# Patient Record
Sex: Male | Born: 1950 | Race: White | Hispanic: No | Marital: Married | Smoking: Former smoker
Health system: Southern US, Community
[De-identification: ages and names within clinical notes are randomized; demographics above are authoritative.]

## PROBLEM LIST (undated history)

## (undated) DIAGNOSIS — G5600 Carpal tunnel syndrome, unspecified upper limb: Secondary | ICD-10-CM

## (undated) DIAGNOSIS — T884XXA Failed or difficult intubation, initial encounter: Secondary | ICD-10-CM

## (undated) DIAGNOSIS — K449 Diaphragmatic hernia without obstruction or gangrene: Secondary | ICD-10-CM

## (undated) DIAGNOSIS — G4733 Obstructive sleep apnea (adult) (pediatric): Secondary | ICD-10-CM

## (undated) DIAGNOSIS — G4730 Sleep apnea, unspecified: Secondary | ICD-10-CM

## (undated) DIAGNOSIS — N183 Chronic kidney disease, stage 3 unspecified: Secondary | ICD-10-CM

## (undated) DIAGNOSIS — N200 Calculus of kidney: Secondary | ICD-10-CM

## (undated) DIAGNOSIS — D649 Anemia, unspecified: Secondary | ICD-10-CM

## (undated) DIAGNOSIS — E785 Hyperlipidemia, unspecified: Secondary | ICD-10-CM

## (undated) DIAGNOSIS — Z9689 Presence of other specified functional implants: Secondary | ICD-10-CM

## (undated) DIAGNOSIS — F4310 Post-traumatic stress disorder, unspecified: Secondary | ICD-10-CM

## (undated) DIAGNOSIS — N419 Inflammatory disease of prostate, unspecified: Secondary | ICD-10-CM

## (undated) DIAGNOSIS — Z950 Presence of cardiac pacemaker: Secondary | ICD-10-CM

## (undated) DIAGNOSIS — I10 Essential (primary) hypertension: Secondary | ICD-10-CM

## (undated) DIAGNOSIS — G894 Chronic pain syndrome: Secondary | ICD-10-CM

## (undated) DIAGNOSIS — M4305 Spondylolysis, thoracolumbar region: Secondary | ICD-10-CM

## (undated) DIAGNOSIS — J449 Chronic obstructive pulmonary disease, unspecified: Secondary | ICD-10-CM

## (undated) DIAGNOSIS — Z87442 Personal history of urinary calculi: Secondary | ICD-10-CM

## (undated) DIAGNOSIS — I700 Atherosclerosis of aorta: Secondary | ICD-10-CM

## (undated) DIAGNOSIS — G5603 Carpal tunnel syndrome, bilateral upper limbs: Secondary | ICD-10-CM

## (undated) DIAGNOSIS — F329 Major depressive disorder, single episode, unspecified: Secondary | ICD-10-CM

## (undated) DIAGNOSIS — N529 Male erectile dysfunction, unspecified: Secondary | ICD-10-CM

## (undated) DIAGNOSIS — C801 Malignant (primary) neoplasm, unspecified: Secondary | ICD-10-CM

## (undated) DIAGNOSIS — E663 Overweight: Secondary | ICD-10-CM

## (undated) DIAGNOSIS — M436 Torticollis: Secondary | ICD-10-CM

## (undated) DIAGNOSIS — M5030 Other cervical disc degeneration, unspecified cervical region: Secondary | ICD-10-CM

## (undated) DIAGNOSIS — Z9289 Personal history of other medical treatment: Secondary | ICD-10-CM

## (undated) DIAGNOSIS — M129 Arthropathy, unspecified: Secondary | ICD-10-CM

## (undated) DIAGNOSIS — G5793 Unspecified mononeuropathy of bilateral lower limbs: Secondary | ICD-10-CM

## (undated) DIAGNOSIS — T4145XA Adverse effect of unspecified anesthetic, initial encounter: Secondary | ICD-10-CM

## (undated) DIAGNOSIS — R0600 Dyspnea, unspecified: Secondary | ICD-10-CM

## (undated) DIAGNOSIS — Z8719 Personal history of other diseases of the digestive system: Secondary | ICD-10-CM

## (undated) DIAGNOSIS — G629 Polyneuropathy, unspecified: Secondary | ICD-10-CM

## (undated) DIAGNOSIS — R7303 Prediabetes: Secondary | ICD-10-CM

## (undated) DIAGNOSIS — K219 Gastro-esophageal reflux disease without esophagitis: Secondary | ICD-10-CM

## (undated) DIAGNOSIS — I499 Cardiac arrhythmia, unspecified: Secondary | ICD-10-CM

## (undated) DIAGNOSIS — M1990 Unspecified osteoarthritis, unspecified site: Secondary | ICD-10-CM

## (undated) DIAGNOSIS — H269 Unspecified cataract: Secondary | ICD-10-CM

## (undated) DIAGNOSIS — N189 Chronic kidney disease, unspecified: Secondary | ICD-10-CM

## (undated) DIAGNOSIS — F32A Depression, unspecified: Secondary | ICD-10-CM

## (undated) DIAGNOSIS — F419 Anxiety disorder, unspecified: Secondary | ICD-10-CM

## (undated) DIAGNOSIS — R339 Retention of urine, unspecified: Secondary | ICD-10-CM

## (undated) DIAGNOSIS — T8859XA Other complications of anesthesia, initial encounter: Secondary | ICD-10-CM

## (undated) DIAGNOSIS — E669 Obesity, unspecified: Secondary | ICD-10-CM

## (undated) HISTORY — DX: Inflammatory disease of prostate, unspecified: N41.9

## (undated) HISTORY — PX: CERVICAL FUSION: SHX112

## (undated) HISTORY — DX: Overweight: E66.3

## (undated) HISTORY — PX: CARDIAC PACEMAKER PLACEMENT: SHX583

## (undated) HISTORY — PX: COLONOSCOPY: SHX5424

## (undated) HISTORY — DX: Retention of urine, unspecified: R33.9

## (undated) HISTORY — PX: APPENDECTOMY: SHX54

## (undated) HISTORY — DX: Calculus of kidney: N20.0

## (undated) HISTORY — DX: Essential (primary) hypertension: I10

## (undated) HISTORY — DX: Obesity, unspecified: E66.9

## (undated) HISTORY — DX: Arthropathy, unspecified: M12.9

## (undated) HISTORY — PX: JOINT REPLACEMENT: SHX530

## (undated) HISTORY — PX: TONSILLECTOMY: SUR1361

## (undated) HISTORY — DX: Male erectile dysfunction, unspecified: N52.9

## (undated) HISTORY — PX: BACK SURGERY: SHX140

---

## 1986-01-28 HISTORY — PX: LUMBAR LAMINECTOMY: SHX95

## 2004-01-29 HISTORY — PX: LUMBAR LAMINECTOMY: SHX95

## 2004-03-19 ENCOUNTER — Encounter: Payer: Self-pay | Admitting: Rheumatology

## 2004-03-28 ENCOUNTER — Encounter: Payer: Self-pay | Admitting: Rheumatology

## 2004-04-24 ENCOUNTER — Ambulatory Visit: Payer: Self-pay | Admitting: Rheumatology

## 2004-04-28 ENCOUNTER — Encounter: Payer: Self-pay | Admitting: Family Medicine

## 2004-04-28 ENCOUNTER — Encounter: Payer: Self-pay | Admitting: Rheumatology

## 2004-05-28 ENCOUNTER — Encounter: Payer: Self-pay | Admitting: Family Medicine

## 2004-07-17 ENCOUNTER — Ambulatory Visit: Payer: Self-pay

## 2004-10-11 ENCOUNTER — Ambulatory Visit: Payer: Self-pay | Admitting: Anesthesiology

## 2004-10-25 ENCOUNTER — Encounter: Payer: Self-pay | Admitting: Anesthesiology

## 2004-10-28 ENCOUNTER — Encounter: Payer: Self-pay | Admitting: Anesthesiology

## 2004-11-06 ENCOUNTER — Ambulatory Visit: Payer: Self-pay | Admitting: Anesthesiology

## 2004-11-12 ENCOUNTER — Ambulatory Visit: Payer: Self-pay | Admitting: Pain Medicine

## 2004-12-18 ENCOUNTER — Ambulatory Visit: Payer: Self-pay | Admitting: Gastroenterology

## 2004-12-25 ENCOUNTER — Ambulatory Visit: Payer: Self-pay | Admitting: Anesthesiology

## 2005-01-09 ENCOUNTER — Ambulatory Visit: Payer: Self-pay | Admitting: Pain Medicine

## 2005-01-30 ENCOUNTER — Ambulatory Visit: Payer: Self-pay | Admitting: Pain Medicine

## 2005-02-04 ENCOUNTER — Encounter: Payer: Self-pay | Admitting: Anesthesiology

## 2005-03-04 ENCOUNTER — Ambulatory Visit: Payer: Self-pay | Admitting: Physician Assistant

## 2005-04-02 ENCOUNTER — Ambulatory Visit: Payer: Self-pay | Admitting: Physician Assistant

## 2005-05-02 ENCOUNTER — Ambulatory Visit: Payer: Self-pay | Admitting: Physician Assistant

## 2005-06-05 ENCOUNTER — Ambulatory Visit: Payer: Self-pay | Admitting: Physician Assistant

## 2005-06-27 ENCOUNTER — Encounter: Payer: Self-pay | Admitting: Family Medicine

## 2005-06-28 ENCOUNTER — Encounter: Payer: Self-pay | Admitting: Family Medicine

## 2005-07-02 ENCOUNTER — Ambulatory Visit: Payer: Self-pay | Admitting: Physician Assistant

## 2005-07-15 ENCOUNTER — Ambulatory Visit: Payer: Self-pay | Admitting: Physician Assistant

## 2005-07-17 ENCOUNTER — Ambulatory Visit: Payer: Self-pay | Admitting: Physician Assistant

## 2005-07-22 ENCOUNTER — Ambulatory Visit: Payer: Self-pay | Admitting: Physician Assistant

## 2005-08-06 ENCOUNTER — Ambulatory Visit: Payer: Self-pay | Admitting: Physician Assistant

## 2005-08-19 ENCOUNTER — Ambulatory Visit: Payer: Self-pay | Admitting: Pain Medicine

## 2005-08-20 ENCOUNTER — Ambulatory Visit: Payer: Self-pay | Admitting: Pain Medicine

## 2005-09-04 ENCOUNTER — Ambulatory Visit: Payer: Self-pay | Admitting: Physician Assistant

## 2005-09-16 ENCOUNTER — Ambulatory Visit: Payer: Self-pay | Admitting: Pain Medicine

## 2005-09-17 ENCOUNTER — Ambulatory Visit: Payer: Self-pay | Admitting: Pain Medicine

## 2005-09-26 ENCOUNTER — Ambulatory Visit: Payer: Self-pay | Admitting: Family Medicine

## 2005-10-09 ENCOUNTER — Ambulatory Visit: Payer: Self-pay | Admitting: Physician Assistant

## 2005-10-23 ENCOUNTER — Ambulatory Visit: Payer: Self-pay | Admitting: Pain Medicine

## 2005-10-24 ENCOUNTER — Ambulatory Visit: Payer: Self-pay | Admitting: Pain Medicine

## 2005-11-07 ENCOUNTER — Ambulatory Visit: Payer: Self-pay | Admitting: Pain Medicine

## 2005-11-21 ENCOUNTER — Ambulatory Visit: Payer: Self-pay | Admitting: Pain Medicine

## 2005-12-17 ENCOUNTER — Ambulatory Visit: Payer: Self-pay | Admitting: Physician Assistant

## 2006-01-15 ENCOUNTER — Ambulatory Visit: Payer: Self-pay | Admitting: Physician Assistant

## 2006-01-28 HISTORY — PX: ANTERIOR CERVICAL DECOMP/DISCECTOMY FUSION: SHX1161

## 2006-01-28 HISTORY — PX: CERVICAL SPINE SURGERY: SHX589

## 2006-02-19 ENCOUNTER — Ambulatory Visit: Payer: Self-pay | Admitting: Physician Assistant

## 2006-03-19 ENCOUNTER — Ambulatory Visit: Payer: Self-pay | Admitting: Physician Assistant

## 2006-04-03 ENCOUNTER — Encounter: Payer: Self-pay | Admitting: Podiatry

## 2006-04-14 ENCOUNTER — Ambulatory Visit: Payer: Self-pay | Admitting: Physician Assistant

## 2006-04-24 ENCOUNTER — Ambulatory Visit: Payer: Self-pay | Admitting: Physician Assistant

## 2006-05-26 ENCOUNTER — Ambulatory Visit: Payer: Self-pay | Admitting: Physician Assistant

## 2006-06-02 ENCOUNTER — Other Ambulatory Visit: Payer: Self-pay

## 2006-06-02 ENCOUNTER — Inpatient Hospital Stay: Payer: Self-pay | Admitting: Unknown Physician Specialty

## 2006-06-16 ENCOUNTER — Ambulatory Visit: Payer: Self-pay | Admitting: Unknown Physician Specialty

## 2006-06-17 ENCOUNTER — Ambulatory Visit: Payer: Self-pay | Admitting: Physician Assistant

## 2006-06-29 ENCOUNTER — Ambulatory Visit: Payer: Self-pay | Admitting: Unknown Physician Specialty

## 2006-07-02 ENCOUNTER — Ambulatory Visit: Payer: Self-pay | Admitting: Physician Assistant

## 2006-08-11 ENCOUNTER — Ambulatory Visit: Payer: Self-pay | Admitting: Physician Assistant

## 2006-09-08 ENCOUNTER — Ambulatory Visit: Payer: Self-pay | Admitting: Physician Assistant

## 2006-10-08 ENCOUNTER — Ambulatory Visit: Payer: Self-pay | Admitting: Physician Assistant

## 2006-11-04 ENCOUNTER — Ambulatory Visit: Payer: Self-pay | Admitting: Physician Assistant

## 2007-04-04 ENCOUNTER — Ambulatory Visit: Payer: Self-pay | Admitting: Family Medicine

## 2007-07-05 ENCOUNTER — Emergency Department: Payer: Self-pay | Admitting: Emergency Medicine

## 2007-07-14 ENCOUNTER — Ambulatory Visit: Payer: Self-pay | Admitting: Physician Assistant

## 2007-07-28 ENCOUNTER — Ambulatory Visit: Payer: Self-pay | Admitting: Pain Medicine

## 2007-08-10 ENCOUNTER — Ambulatory Visit: Payer: Self-pay | Admitting: Pain Medicine

## 2007-12-28 ENCOUNTER — Ambulatory Visit: Payer: Self-pay | Admitting: Family Medicine

## 2008-01-12 ENCOUNTER — Ambulatory Visit: Payer: Self-pay | Admitting: Unknown Physician Specialty

## 2008-02-22 ENCOUNTER — Ambulatory Visit: Payer: Self-pay | Admitting: Unknown Physician Specialty

## 2008-02-24 ENCOUNTER — Ambulatory Visit: Payer: Self-pay | Admitting: Unknown Physician Specialty

## 2008-02-26 ENCOUNTER — Emergency Department: Payer: Self-pay | Admitting: Emergency Medicine

## 2008-02-28 ENCOUNTER — Ambulatory Visit: Payer: Self-pay | Admitting: Family Medicine

## 2009-01-28 DIAGNOSIS — C61 Malignant neoplasm of prostate: Secondary | ICD-10-CM

## 2009-01-28 DIAGNOSIS — C801 Malignant (primary) neoplasm, unspecified: Secondary | ICD-10-CM

## 2009-01-28 HISTORY — DX: Malignant (primary) neoplasm, unspecified: C80.1

## 2009-01-28 HISTORY — DX: Malignant neoplasm of prostate: C61

## 2009-08-25 ENCOUNTER — Ambulatory Visit: Payer: Self-pay

## 2010-01-04 ENCOUNTER — Ambulatory Visit: Payer: Self-pay

## 2010-01-09 ENCOUNTER — Ambulatory Visit: Payer: Self-pay | Admitting: Unknown Physician Specialty

## 2010-01-12 ENCOUNTER — Ambulatory Visit: Payer: Self-pay

## 2010-01-16 ENCOUNTER — Inpatient Hospital Stay: Payer: Self-pay | Admitting: Unknown Physician Specialty

## 2010-06-14 ENCOUNTER — Ambulatory Visit: Payer: Self-pay | Admitting: Specialist

## 2010-06-28 ENCOUNTER — Ambulatory Visit: Payer: Self-pay | Admitting: Specialist

## 2010-06-29 ENCOUNTER — Ambulatory Visit: Payer: Self-pay | Admitting: Specialist

## 2010-07-29 ENCOUNTER — Ambulatory Visit: Payer: Self-pay | Admitting: Specialist

## 2010-08-29 ENCOUNTER — Ambulatory Visit: Payer: Self-pay | Admitting: Specialist

## 2010-09-24 ENCOUNTER — Ambulatory Visit: Payer: Self-pay | Admitting: Urology

## 2010-09-29 ENCOUNTER — Ambulatory Visit: Payer: Self-pay | Admitting: Specialist

## 2010-10-29 ENCOUNTER — Ambulatory Visit: Payer: Self-pay | Admitting: Specialist

## 2010-12-20 ENCOUNTER — Emergency Department: Payer: Self-pay | Admitting: Emergency Medicine

## 2011-01-01 ENCOUNTER — Ambulatory Visit: Payer: Self-pay | Admitting: Specialist

## 2011-03-26 ENCOUNTER — Ambulatory Visit: Payer: Self-pay | Admitting: Family Medicine

## 2011-05-29 DIAGNOSIS — I442 Atrioventricular block, complete: Secondary | ICD-10-CM

## 2011-05-29 HISTORY — DX: Atrioventricular block, complete: I44.2

## 2011-06-26 ENCOUNTER — Ambulatory Visit: Payer: Self-pay | Admitting: Family Medicine

## 2011-06-26 ENCOUNTER — Inpatient Hospital Stay: Payer: Self-pay | Admitting: Internal Medicine

## 2011-06-26 LAB — PROTIME-INR
INR: 1.1
Prothrombin Time: 14.3 secs (ref 11.5–14.7)

## 2011-06-26 LAB — COMPREHENSIVE METABOLIC PANEL
Albumin: 3.4 g/dL (ref 3.4–5.0)
Alkaline Phosphatase: 71 U/L (ref 50–136)
Anion Gap: 11 (ref 7–16)
BUN: 19 mg/dL — ABNORMAL HIGH (ref 7–18)
Bilirubin,Total: 0.9 mg/dL (ref 0.2–1.0)
Calcium, Total: 8.2 mg/dL — ABNORMAL LOW (ref 8.5–10.1)
Chloride: 111 mmol/L — ABNORMAL HIGH (ref 98–107)
Co2: 24 mmol/L (ref 21–32)
Creatinine: 0.99 mg/dL (ref 0.60–1.30)
EGFR (African American): >60
EGFR (Non-African Amer.): >60
Glucose: 76 mg/dL (ref 65–99)
Osmolality: 292 (ref 275–301)
Potassium: 3.7 mmol/L (ref 3.5–5.1)
SGOT(AST): 25 U/L (ref 15–37)
SGPT (ALT): 29 U/L
Sodium: 146 mmol/L — ABNORMAL HIGH (ref 136–145)
Total Protein: 6.4 g/dL (ref 6.4–8.2)

## 2011-06-26 LAB — CBC
HCT: 42.5 % (ref 40.0–52.0)
HGB: 14.7 g/dL (ref 13.0–18.0)
MCH: 32.7 pg (ref 26.0–34.0)
MCHC: 34.6 g/dL (ref 32.0–36.0)
MCV: 95 fL (ref 80–100)
Platelet: 169 10*3/uL (ref 150–440)
RBC: 4.49 10*6/uL (ref 4.40–5.90)
RDW: 13 % (ref 11.5–14.5)
WBC: 8.8 10*3/uL (ref 3.8–10.6)

## 2011-06-26 LAB — APTT: Activated PTT: 32.3 secs (ref 23.6–35.9)

## 2011-06-26 LAB — CK TOTAL AND CKMB (NOT AT ARMC)
CK, Total: 200 U/L (ref 35–232)
CK-MB: 4.2 ng/mL — ABNORMAL HIGH (ref 0.5–3.6)

## 2011-06-26 LAB — TROPONIN I: Troponin-I: <0.02 ng/mL

## 2011-06-27 LAB — BASIC METABOLIC PANEL
Anion Gap: 7 (ref 7–16)
BUN: 18 mg/dL (ref 7–18)
Calcium, Total: 8 mg/dL — ABNORMAL LOW (ref 8.5–10.1)
Chloride: 113 mmol/L — ABNORMAL HIGH (ref 98–107)
Co2: 26 mmol/L (ref 21–32)
Creatinine: 0.76 mg/dL (ref 0.60–1.30)
EGFR (African American): >60
EGFR (Non-African Amer.): >60
Glucose: 90 mg/dL (ref 65–99)
Osmolality: 292 (ref 275–301)
Potassium: 3.7 mmol/L (ref 3.5–5.1)
Sodium: 146 mmol/L — ABNORMAL HIGH (ref 136–145)

## 2011-06-27 LAB — CBC WITH DIFFERENTIAL/PLATELET
Basophil #: 0.1 10*3/uL (ref 0.0–0.1)
Basophil %: 0.8 %
Eosinophil #: 0.2 10*3/uL (ref 0.0–0.7)
Eosinophil %: 2.8 %
HCT: 40 % (ref 40.0–52.0)
HGB: 13.7 g/dL (ref 13.0–18.0)
Lymphocyte #: 2.4 10*3/uL (ref 1.0–3.6)
Lymphocyte %: 31.3 %
MCH: 32.4 pg (ref 26.0–34.0)
MCHC: 34.2 g/dL (ref 32.0–36.0)
MCV: 95 fL (ref 80–100)
Monocyte #: 0.6 x10 3/mm (ref 0.2–1.0)
Monocyte %: 8.2 %
Neutrophil #: 4.3 10*3/uL (ref 1.4–6.5)
Neutrophil %: 56.9 %
Platelet: 146 10*3/uL — ABNORMAL LOW (ref 150–440)
RBC: 4.22 10*6/uL — ABNORMAL LOW (ref 4.40–5.90)
RDW: 13.2 % (ref 11.5–14.5)
WBC: 7.5 10*3/uL (ref 3.8–10.6)

## 2011-06-27 LAB — TROPONIN I
Troponin-I: <0.02 ng/mL
Troponin-I: <0.02 ng/mL

## 2011-06-27 LAB — CK TOTAL AND CKMB (NOT AT ARMC)
CK, Total: 128 U/L (ref 35–232)
CK, Total: 99 U/L (ref 35–232)
CK-MB: 3.4 ng/mL (ref 0.5–3.6)
CK-MB: 2.6 ng/mL (ref 0.5–3.6)

## 2011-06-27 LAB — TSH: Thyroid Stimulating Horm: 0.468 u[IU]/mL

## 2011-06-28 DIAGNOSIS — Z950 Presence of cardiac pacemaker: Secondary | ICD-10-CM

## 2011-06-28 HISTORY — DX: Presence of cardiac pacemaker: Z95.0

## 2011-06-28 HISTORY — PX: CARDIAC PACEMAKER PLACEMENT: SHX583

## 2011-09-13 ENCOUNTER — Ambulatory Visit: Payer: Self-pay | Admitting: Cardiovascular Disease

## 2011-09-13 DIAGNOSIS — I2510 Atherosclerotic heart disease of native coronary artery without angina pectoris: Secondary | ICD-10-CM

## 2011-09-13 HISTORY — DX: Atherosclerotic heart disease of native coronary artery without angina pectoris: I25.10

## 2011-09-13 HISTORY — PX: LEFT HEART CATH AND CORONARY ANGIOGRAPHY: CATH118249

## 2012-08-11 ENCOUNTER — Ambulatory Visit: Payer: Self-pay | Admitting: Orthopedic Surgery

## 2012-10-12 ENCOUNTER — Ambulatory Visit: Payer: Self-pay | Admitting: Orthopedic Surgery

## 2012-10-12 LAB — MRSA PCR SCREENING

## 2012-10-12 LAB — BASIC METABOLIC PANEL
Anion Gap: 5 — ABNORMAL LOW (ref 7–16)
BUN: 21 mg/dL — ABNORMAL HIGH (ref 7–18)
Calcium, Total: 8.9 mg/dL (ref 8.5–10.1)
Chloride: 108 mmol/L — ABNORMAL HIGH (ref 98–107)
Co2: 27 mmol/L (ref 21–32)
Creatinine: 0.96 mg/dL (ref 0.60–1.30)
EGFR (African American): >60
EGFR (Non-African Amer.): >60
Glucose: 93 mg/dL (ref 65–99)
Osmolality: 282 (ref 275–301)
Potassium: 3.9 mmol/L (ref 3.5–5.1)
Sodium: 140 mmol/L (ref 136–145)

## 2012-10-12 LAB — URINALYSIS, COMPLETE
Bacteria: NONE SEEN
Bilirubin,UR: NEGATIVE
Blood: NEGATIVE
Glucose,UR: NEGATIVE mg/dL (ref 0–75)
Hyaline Cast: 2
Leukocyte Esterase: NEGATIVE
Nitrite: NEGATIVE
Ph: 5 (ref 4.5–8.0)
Protein: 25
RBC,UR: 14 /HPF (ref 0–5)
Specific Gravity: 1.015 (ref 1.003–1.030)
Squamous Epithelial: 1
WBC UR: 6 /HPF (ref 0–5)

## 2012-10-12 LAB — PROTIME-INR
INR: 1
Prothrombin Time: 13.7 secs (ref 11.5–14.7)

## 2012-10-12 LAB — APTT: Activated PTT: 35.1 secs (ref 23.6–35.9)

## 2012-10-12 LAB — CBC
HCT: 46.1 % (ref 40.0–52.0)
HGB: 16.1 g/dL (ref 13.0–18.0)
MCH: 32.5 pg (ref 26.0–34.0)
MCHC: 35 g/dL (ref 32.0–36.0)
MCV: 93 fL (ref 80–100)
Platelet: 207 10*3/uL (ref 150–440)
RBC: 4.96 10*6/uL (ref 4.40–5.90)
RDW: 12.8 % (ref 11.5–14.5)
WBC: 11.1 10*3/uL — ABNORMAL HIGH (ref 3.8–10.6)

## 2012-10-12 LAB — SEDIMENTATION RATE: Erythrocyte Sed Rate: 5 mm/hr (ref 0–20)

## 2012-10-22 ENCOUNTER — Inpatient Hospital Stay: Payer: Self-pay | Admitting: Orthopedic Surgery

## 2012-10-22 LAB — APTT: Activated PTT: 25.7 secs (ref 23.6–35.9)

## 2012-10-22 LAB — CBC WITH DIFFERENTIAL/PLATELET
Basophil #: 0.1 10*3/uL (ref 0.0–0.1)
Basophil %: 0.3 %
Eosinophil #: 0 10*3/uL (ref 0.0–0.7)
Eosinophil %: 0.2 %
HCT: 47.2 % (ref 40.0–52.0)
HGB: 16 g/dL (ref 13.0–18.0)
Lymphocyte #: 2.2 10*3/uL (ref 1.0–3.6)
Lymphocyte %: 7.5 %
MCH: 32.5 pg (ref 26.0–34.0)
MCHC: 33.9 g/dL (ref 32.0–36.0)
MCV: 96 fL (ref 80–100)
Monocyte #: 1.3 x10 3/mm — ABNORMAL HIGH (ref 0.2–1.0)
Monocyte %: 4.3 %
Neutrophil #: 26 10*3/uL — ABNORMAL HIGH (ref 1.4–6.5)
Neutrophil %: 87.7 %
Platelet: 207 10*3/uL (ref 150–440)
RBC: 4.92 10*6/uL (ref 4.40–5.90)
RDW: 13.1 % (ref 11.5–14.5)
WBC: 29.6 10*3/uL — ABNORMAL HIGH (ref 3.8–10.6)

## 2012-10-22 LAB — BASIC METABOLIC PANEL
Anion Gap: 8 (ref 7–16)
BUN: 15 mg/dL (ref 7–18)
Calcium, Total: 7.4 mg/dL — ABNORMAL LOW (ref 8.5–10.1)
Chloride: 110 mmol/L — ABNORMAL HIGH (ref 98–107)
Co2: 20 mmol/L — ABNORMAL LOW (ref 21–32)
Creatinine: 1.2 mg/dL (ref 0.60–1.30)
EGFR (African American): >60
EGFR (Non-African Amer.): >60
Glucose: 217 mg/dL — ABNORMAL HIGH (ref 65–99)
Osmolality: 283 (ref 275–301)
Potassium: 4.9 mmol/L (ref 3.5–5.1)
Sodium: 138 mmol/L (ref 136–145)

## 2012-10-22 LAB — HEMOGLOBIN: HGB: 15.3 g/dL (ref 13.0–18.0)

## 2012-10-22 LAB — PROTIME-INR
INR: 1.1
Prothrombin Time: 14.3 secs (ref 11.5–14.7)

## 2012-10-23 LAB — CBC WITH DIFFERENTIAL/PLATELET
Basophil #: 0 10*3/uL (ref 0.0–0.1)
Basophil %: 0.2 %
Eosinophil #: 0 10*3/uL (ref 0.0–0.7)
Eosinophil %: 0 %
HCT: 42.2 % (ref 40.0–52.0)
HGB: 14.6 g/dL (ref 13.0–18.0)
Lymphocyte #: 1.6 10*3/uL (ref 1.0–3.6)
Lymphocyte %: 7.5 %
MCH: 32.9 pg (ref 26.0–34.0)
MCHC: 34.6 g/dL (ref 32.0–36.0)
MCV: 95 fL (ref 80–100)
Monocyte #: 1.6 x10 3/mm — ABNORMAL HIGH (ref 0.2–1.0)
Monocyte %: 7.3 %
Neutrophil #: 18.7 10*3/uL — ABNORMAL HIGH (ref 1.4–6.5)
Neutrophil %: 85 %
Platelet: 160 10*3/uL (ref 150–440)
RBC: 4.44 10*6/uL (ref 4.40–5.90)
RDW: 13.3 % (ref 11.5–14.5)
WBC: 22 10*3/uL — ABNORMAL HIGH (ref 3.8–10.6)

## 2012-10-23 LAB — BASIC METABOLIC PANEL
Anion Gap: 12 (ref 7–16)
BUN: 27 mg/dL — ABNORMAL HIGH (ref 7–18)
Calcium, Total: 7.7 mg/dL — ABNORMAL LOW (ref 8.5–10.1)
Chloride: 110 mmol/L — ABNORMAL HIGH (ref 98–107)
Co2: 18 mmol/L — ABNORMAL LOW (ref 21–32)
Creatinine: 1.84 mg/dL — ABNORMAL HIGH (ref 0.60–1.30)
EGFR (African American): 45 — ABNORMAL LOW
EGFR (Non-African Amer.): 39 — ABNORMAL LOW
Glucose: 146 mg/dL — ABNORMAL HIGH (ref 65–99)
Osmolality: 287 (ref 275–301)
Potassium: 4.8 mmol/L (ref 3.5–5.1)
Sodium: 140 mmol/L (ref 136–145)

## 2012-10-24 LAB — URINALYSIS, COMPLETE
Bilirubin,UR: NEGATIVE
Glucose,UR: NEGATIVE mg/dL (ref 0–75)
Ketone: NEGATIVE
Leukocyte Esterase: NEGATIVE
Nitrite: NEGATIVE
Ph: 5 (ref 4.5–8.0)
Protein: NEGATIVE
RBC,UR: 192 /HPF (ref 0–5)
Specific Gravity: 1.014 (ref 1.003–1.030)
Squamous Epithelial: 1
WBC UR: 8 /HPF (ref 0–5)

## 2012-10-24 LAB — BASIC METABOLIC PANEL
Anion Gap: 5 — ABNORMAL LOW (ref 7–16)
BUN: 24 mg/dL — ABNORMAL HIGH (ref 7–18)
Calcium, Total: 7.7 mg/dL — ABNORMAL LOW (ref 8.5–10.1)
Chloride: 111 mmol/L — ABNORMAL HIGH (ref 98–107)
Co2: 24 mmol/L (ref 21–32)
Creatinine: 1.23 mg/dL (ref 0.60–1.30)
EGFR (African American): >60
EGFR (Non-African Amer.): >60
Glucose: 150 mg/dL — ABNORMAL HIGH (ref 65–99)
Osmolality: 286 (ref 275–301)
Potassium: 4.7 mmol/L (ref 3.5–5.1)
Sodium: 140 mmol/L (ref 136–145)

## 2012-10-24 LAB — HEMOGLOBIN: HGB: 10.4 g/dL — ABNORMAL LOW (ref 13.0–18.0)

## 2012-10-25 LAB — CBC WITH DIFFERENTIAL/PLATELET
Basophil #: 0 10*3/uL (ref 0.0–0.1)
Basophil %: 0.3 %
Eosinophil #: 0.2 10*3/uL (ref 0.0–0.7)
Eosinophil %: 1.4 %
HCT: 26.4 % — ABNORMAL LOW (ref 40.0–52.0)
HGB: 9.5 g/dL — ABNORMAL LOW (ref 13.0–18.0)
Lymphocyte #: 1 10*3/uL (ref 1.0–3.6)
Lymphocyte %: 8.1 %
MCH: 33.3 pg (ref 26.0–34.0)
MCHC: 35.7 g/dL (ref 32.0–36.0)
MCV: 93 fL (ref 80–100)
Monocyte #: 0.9 x10 3/mm (ref 0.2–1.0)
Monocyte %: 6.7 %
Neutrophil #: 10.7 10*3/uL — ABNORMAL HIGH (ref 1.4–6.5)
Neutrophil %: 83.5 %
Platelet: 103 10*3/uL — ABNORMAL LOW (ref 150–440)
RBC: 2.83 10*6/uL — ABNORMAL LOW (ref 4.40–5.90)
RDW: 13 % (ref 11.5–14.5)
WBC: 12.8 10*3/uL — ABNORMAL HIGH (ref 3.8–10.6)

## 2012-10-26 LAB — HEMOGLOBIN: HGB: 9.2 g/dL — ABNORMAL LOW (ref 13.0–18.0)

## 2013-07-12 DIAGNOSIS — T84099A Other mechanical complication of unspecified internal joint prosthesis, initial encounter: Secondary | ICD-10-CM | POA: Insufficient documentation

## 2013-07-12 DIAGNOSIS — Z96649 Presence of unspecified artificial hip joint: Secondary | ICD-10-CM | POA: Insufficient documentation

## 2013-07-12 DIAGNOSIS — T84018A Broken internal joint prosthesis, other site, initial encounter: Secondary | ICD-10-CM | POA: Insufficient documentation

## 2013-07-14 ENCOUNTER — Ambulatory Visit: Payer: Self-pay | Admitting: Orthopedic Surgery

## 2013-07-14 DIAGNOSIS — I10 Essential (primary) hypertension: Secondary | ICD-10-CM

## 2013-07-14 LAB — URINALYSIS, COMPLETE
Bacteria: NONE SEEN
Bilirubin,UR: NEGATIVE
Blood: NEGATIVE
Glucose,UR: NEGATIVE mg/dL (ref 0–75)
Ketone: NEGATIVE
Leukocyte Esterase: NEGATIVE
Nitrite: NEGATIVE
Ph: 5 (ref 4.5–8.0)
Protein: NEGATIVE
RBC,UR: 1 /HPF (ref 0–5)
Specific Gravity: 1.019 (ref 1.003–1.030)
Squamous Epithelial: 1
WBC UR: 2 /HPF (ref 0–5)

## 2013-07-14 LAB — BASIC METABOLIC PANEL
Anion Gap: 7 (ref 7–16)
BUN: 18 mg/dL (ref 7–18)
Calcium, Total: 8.3 mg/dL — ABNORMAL LOW (ref 8.5–10.1)
Chloride: 109 mmol/L — ABNORMAL HIGH (ref 98–107)
Co2: 25 mmol/L (ref 21–32)
Creatinine: 0.75 mg/dL (ref 0.60–1.30)
EGFR (African American): >60
EGFR (Non-African Amer.): >60
Glucose: 85 mg/dL (ref 65–99)
Osmolality: 282 (ref 275–301)
Potassium: 4.2 mmol/L (ref 3.5–5.1)
Sodium: 141 mmol/L (ref 136–145)

## 2013-07-14 LAB — CBC
HCT: 46 % (ref 40.0–52.0)
HGB: 15.6 g/dL (ref 13.0–18.0)
MCH: 31.7 pg (ref 26.0–34.0)
MCHC: 33.9 g/dL (ref 32.0–36.0)
MCV: 93 fL (ref 80–100)
Platelet: 199 10*3/uL (ref 150–440)
RBC: 4.92 10*6/uL (ref 4.40–5.90)
RDW: 13.8 % (ref 11.5–14.5)
WBC: 9.3 10*3/uL (ref 3.8–10.6)

## 2013-07-14 LAB — APTT: Activated PTT: 31.8 secs (ref 23.6–35.9)

## 2013-07-14 LAB — SEDIMENTATION RATE: Erythrocyte Sed Rate: 8 mm/hr (ref 0–20)

## 2013-07-14 LAB — PROTIME-INR
INR: 1
Prothrombin Time: 13 secs (ref 11.5–14.7)

## 2013-07-14 LAB — MRSA PCR SCREENING

## 2013-07-22 ENCOUNTER — Inpatient Hospital Stay: Payer: Self-pay | Admitting: Orthopedic Surgery

## 2013-07-22 LAB — HEMOGLOBIN: HGB: 15.5 g/dL (ref 13.0–18.0)

## 2013-07-23 LAB — BASIC METABOLIC PANEL
Anion Gap: 8 (ref 7–16)
BUN: 24 mg/dL — ABNORMAL HIGH (ref 7–18)
Calcium, Total: 7.6 mg/dL — ABNORMAL LOW (ref 8.5–10.1)
Chloride: 106 mmol/L (ref 98–107)
Co2: 25 mmol/L (ref 21–32)
Creatinine: 1.46 mg/dL — ABNORMAL HIGH (ref 0.60–1.30)
EGFR (African American): 59 — ABNORMAL LOW
EGFR (Non-African Amer.): 51 — ABNORMAL LOW
Glucose: 117 mg/dL — ABNORMAL HIGH (ref 65–99)
Osmolality: 283 (ref 275–301)
Potassium: 4.4 mmol/L (ref 3.5–5.1)
Sodium: 139 mmol/L (ref 136–145)

## 2013-07-23 LAB — HEMOGLOBIN: HGB: 12.9 g/dL — ABNORMAL LOW (ref 13.0–18.0)

## 2013-07-23 LAB — PLATELET COUNT: Platelet: 159 10*3/uL (ref 150–440)

## 2013-07-24 LAB — HEMOGLOBIN: HGB: 11.9 g/dL — ABNORMAL LOW (ref 13.0–18.0)

## 2013-07-24 LAB — BASIC METABOLIC PANEL
Anion Gap: 8 (ref 7–16)
BUN: 23 mg/dL — ABNORMAL HIGH (ref 7–18)
Calcium, Total: 7.6 mg/dL — ABNORMAL LOW (ref 8.5–10.1)
Chloride: 108 mmol/L — ABNORMAL HIGH (ref 98–107)
Co2: 23 mmol/L (ref 21–32)
Creatinine: 1.32 mg/dL — ABNORMAL HIGH (ref 0.60–1.30)
EGFR (African American): >60
EGFR (Non-African Amer.): 58 — ABNORMAL LOW
Glucose: 107 mg/dL — ABNORMAL HIGH (ref 65–99)
Osmolality: 282 (ref 275–301)
Potassium: 4 mmol/L (ref 3.5–5.1)
Sodium: 139 mmol/L (ref 136–145)

## 2013-07-24 LAB — PLATELET COUNT: Platelet: 142 10*3/uL — ABNORMAL LOW (ref 150–440)

## 2013-11-08 DIAGNOSIS — M129 Arthropathy, unspecified: Secondary | ICD-10-CM | POA: Insufficient documentation

## 2013-11-08 HISTORY — DX: Arthropathy, unspecified: M12.9

## 2014-01-07 ENCOUNTER — Ambulatory Visit: Payer: Self-pay | Admitting: Family Medicine

## 2014-01-07 LAB — URINALYSIS, COMPLETE
Bacteria: NEGATIVE
Bilirubin,UR: NEGATIVE
Glucose,UR: NEGATIVE
Ketone: NEGATIVE
Leukocyte Esterase: NEGATIVE
Nitrite: NEGATIVE
Ph: 6.5 (ref 5.0–8.0)
Specific Gravity: 1.015 (ref 1.000–1.030)

## 2014-01-10 ENCOUNTER — Emergency Department: Payer: Self-pay | Admitting: Emergency Medicine

## 2014-01-24 ENCOUNTER — Ambulatory Visit: Payer: Self-pay | Admitting: Unknown Physician Specialty

## 2014-02-11 ENCOUNTER — Ambulatory Visit: Payer: Self-pay | Admitting: Family Medicine

## 2014-02-11 LAB — CREATININE, SERUM
Creatinine: 1.22 mg/dL (ref 0.60–1.30)
EGFR (African American): >60
EGFR (Non-African Amer.): >60

## 2014-03-11 ENCOUNTER — Ambulatory Visit: Payer: Self-pay | Admitting: Pain Medicine

## 2014-03-12 LAB — PSA: PSA: 2.1

## 2014-05-05 ENCOUNTER — Ambulatory Visit
Admit: 2014-05-05 | Disposition: A | Discharge status: Home / Self Care | Payer: Self-pay | Attending: Pain Medicine | Admitting: Pain Medicine

## 2014-05-20 NOTE — Discharge Summary (Signed)
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   PROCEDURE: Revision of total hip. Anesthesia: Spinal.  Surgeon: Laurene Footman, MD, Assistant: Rachelle Hora, PA-C. IV fluids: 3500. Estimated blood loss: 4500 with 2925 mL given back from the  Cell Saver.  Foley output 700 mL.   IMPLANTS: Biomet Regenerex RingLoc 58 mm acetabular cup with 2 bone screws within the cup and E1 acetabular liner, size 24, with a 32 mm 0 head, and Arcos Revision Stem 15 x 150 mm with the previously mentioned size C high offset 70 mm Arcos Modular Proximal Cone.   CONDITION: To recovery room fair with the patient having some hypotension during the procedure and requiring transfusion of 1 unit packed cells.   COMPLICATIONS: None.      ____________________________ T. Rachelle Hora, PA-C tcg:cb D: 10/26/2012 21:22:00 ET T: 10/26/2012 21:30:27 ET  JOB#: [id number]  cc: T. Rachelle Hora, PA-C, <Dictator> Duanne Guess [place] ELECTRONICALLY SIGNED 11/04/2012 9:06

## 2014-05-20 NOTE — Consult Note (Signed)
Patient seen, chart reviewed, note dictated.  Assessment: Urinary retention, prostate cancer, bladder spasms  Recommendation: The volume of urine with the catheter placement was not recorded.  This can make any difference in our course of treatment.  Discussion has been undertaken with the patient.  He has a history of urinary retention.  He has perform self intermittent catheterization in the past.  He has known prostate cancer.  He is currently under watchful waiting.  He has been on Flomax in the past.  He was having significant frequency with the medication.  This is still the best option for getting him voiding spontaneously.  An order has been written for Flomax.  The Foley catheter will need to be left in place until early next week.  Follow-up with Dr. Vonita Moss office/Shannon McGowen-P[facility] is recommended for voiding trial and catheter removal.  This is his established urologist.  His back issues are also likely related to the bladder spasms.  Anticholinergics need to be avoided as this will prolong his urinary retention.  With his prolonged history of intermittent retention, surgical intervention for the prostate cancer may need to be reconsidered on a sooner basis to address both issues.  Performing a TURP or the voiding issues would increase the complication risk for eventual prostate removal.  He understands this aspect.  If there are any further questions, please free to contact us.  Electronic Signatures: Murrell Redden (MD)  (Signed on 30-Sep-14 09:01)  Authored  Last Updated: 30-Sep-14 09:01 by Murrell Redden (MD)

## 2014-05-20 NOTE — Consult Note (Signed)
PATIENT NAME:  Richard Duncan, Richard Duncan MR#:  [id number] DATE OF BIRTH:  1950/04/14  DATE OF CONSULTATION:  10/22/2012  REFERRING PHYSICIANS: Laurene Footman, MD, orthopedics, and Gunnar Fusi, MD, anesthesia.  CONSULTING PHYSICIAN:  Tana Conch. Leslye Peer, MD PRIMARY CARE PHYSICIAN: Sofie Hartigan, MD   REASON FOR CONSULTATION: Hypotension.   HISTORY OF PRESENT ILLNESS: This is a 64 year old man who underwent elective right total hip revision secondary to severe pain. It was an 8-hour surgery. He did have intraop hypotension requiring boluses of Neo-Synephrine. He had an estimated 5400 mL blood loss. He received Cell Saver blood, estimated 2925 mL. He received 1 unit of packed red blood cells, 300 mL, and 4 liters of lactated Ringer's. In the PACU, he received ephedrine. He still had persistent hypotension in the 80s and 90s, and hospitalist services were contacted for further evaluation.   The patient states that he does have 8/10 pain in his right hip. He is able to answer questions. He is breathing comfortably and offers no other complaints besides the right hip pain.   PAST MEDICAL HISTORY: Hypertension, anxiety, depression, obesity.   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 He cannot remember any other surgeries.   ALLERGIES: PROPOFOL IS LISTED IN THE COMPUTER.   MEDICATIONS: As per prescription writer include Norvasc 10 mg daily, aspirin 81 mg daily, benazepril 20 mg daily, Celexa 40 mg at bedtime, diazepam 5 mg one to 2 tablets in the morning, vitamin D3 1000 international units 2 capsules daily.   SOCIAL HISTORY: No smoking cigarettes but does smoke a cigar once a day. Rare alcohol. No drug use. Not working currently.   FAMILY HISTORY: The patient fell asleep at that point in time. Cannot remember.    REVIEW OF SYSTEMS: CONSTITUTIONAL: No weight gain. No weight loss. No fever or chills.  EYES: No blurry vision.   ENT: No sore throat or difficulty swallowing.  CARDIOVASCULAR: No chest pain.  RESPIRATORY: No shortness of breath.  GASTROINTESTINAL: No nausea. No vomiting. No abdominal pain. No diarrhea.  GENITOURINARY: Did have some burning on urination prior to coming in.  MUSCULOSKELETAL: Positive for right hip pain.  INTEGUMENT: No rashes or eruptions.  NEUROLOGIC: No fainting or blackouts.  PSYCHIATRIC: No anxiety or depression.  ENDOCRINE: No thyroid problems.  HEMOLYMPHATIC: No anemia. No easy bruising or bleeding.   PHYSICAL EXAMINATION:  VITAL SIGNS: Pulse 87. Blood pressure 92/55, pulse oximetry 97% on oxygen, respirations 18, temperature on the lower side requiring a [employer].  GENERAL: No respiratory distress, lying flat in bed.  EYES: Conjunctivae and lids normal. Pupils equal, round, and reactive to light. Extraocular muscles intact. No nystagmus.  ENT: Nasal mucosa: No erythema.  THROAT: No erythema, no exudate seen. Lips and gums: No lesions.  NECK: No JVD. No bruits. No lymphadenopathy. No thyromegaly. No thyroid nodules palpated.  RESPIRATORY: Lungs clear to auscultation. No use of accessory muscles to breathe. No rhonchi, rales, or wheeze heard.  CARDIOVASCULAR: S1, S2 soft. No gallops, rubs or murmurs heard. Carotid upstroke 2+ bilaterally. No bruits.  EXTREMITIES: Dorsalis pedis pulses 2+ bilaterally  ABDOMEN: Soft, nontender. No organomegaly/splenomegaly. Normoactive bowel sounds.  LYMPHATIC: No lymph nodes in the neck.  MUSCULOSKELETAL: No clubbing. No cyanosis. Trace edema.  SKIN: Surgical site, right hip, has a wound VAC on it. Anterior approach used.  NEUROLOGIC: The patient did fall asleep with my exam but was able to wiggle his toes bilaterally. I did not  test his reflexes secondary to just having hip surgery.  PSYCHIATRIC: Oriented to person and place but did fall asleep easily and could not remember his family history.  ASSESSMENT AND PLAN:  1.  Intraoperative and  postoperative hypotension. Will hold amlodipine and benazepril at this point. Will watch overnight in the critical care unit. Give IV fluid, normal saline at 150 mL per hour. If blood pressure remains lower than systolic of 165, we will start low-dose Levophed to maintain blood pressure over 100. The patient did take his blood pressure medications this morning. His postoperative laboratories are acceptable with a hemoglobin of 16. Creatinine slightly elevated at 1.2. Need to watch creatinine on a daily basis. Higher risk of acute renal failure with hypotension.  2.  Anxiety and depression. Continue Celexa and Valium.  3.  Obesity with a BMI of 40.3. Weight loss needed.  4.  Leukocytosis, probably reactive secondary to an 8-hour surgery. Will check it again in the a.m. The patient is on empiric antibiotics, cefazolin q.8 hours for 3 doses.  5.  Status post right total hip revision. Pain control as per Dr. Rudene Christians and surgical followup.   TIME SPENT ON CONSULTATION: 55 minutes of critical care.    ____________________________ Tana Conch. Leslye Peer, MD rjw:np D: 10/22/2012 18:20:12 ET T: 10/22/2012 19:41:50 ET JOB#: [id number]  cc: Tana Conch. Leslye Peer, MD, <Dictator> Sofie Hartigan, MD Gunnar Fusi, MD Laurene Footman, MD Marisue [place] MD ELECTRONICALLY SIGNED 10/31/2012 13:46

## 2014-05-20 NOTE — Op Note (Signed)
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   PROCEDURE:  Revision total hip.   ANESTHESIA:  Spinal.   SURGEON: Laurene Footman, MD   ASSISTANT:  Rachelle Hora, PA-C  DESCRIPTION OF PROCEDURE:  The patient was brought to the operating room, and after adequate spinal anesthesia was obtained, the patient was placed on the operative table with the left leg on a well-padded table, right leg in the Medacta traction boot. The C-arm was brought in, and good visualization of the hip was obtained, and preop x-ray taken as a template. The hip was then prepped and draped in usual sterile fashion. Timeout procedure was carried out, along with patient identification, and the procedure begun. An anterior approach was made to the hip centered over the tensor fascia lata muscle. The TFL fascia was exposed after getting through the skin and subcutaneous tissue with hemostasis being achieved with electrocautery. The TFL was retracted laterally, and deep retractor placed. The rectus sheath was opened, and the rectus retracted medially. There were very large lateral circumflex femoral vessels. These were ligated, but with him being very muscular, there was more blood loss than typical secondary to small vessel bleeding. After this initial approach was carried out, the joint was exposed, some tissue and fluid gathered. The anterior capsulotomy was carried out, and with traction, the femoral head could be knocked off the femoral stem. The hip was then externally rotated with pubofemoral and ischiofemoral release, but the hip was quite stiff  in part secondary to prior heterotopic ossification. It was with a great deal of difficulty that the stem was removed, after clearing the soft tissue around the calcar area. Thin flexible  osteotomes were used. There was essentially bony ingrowth all around the lateral aspect of the implant with bone nearly to the trunnion. This was removed with an osteotome as well. The stem stayed in place, and it had been placed deep in his femur at the time of the initial procedure, so a great deal of the proximal femur was above the level of the bony ingrowth. After struggling with getting this loose, the medial femur was split with a saw and osteotomes used to create an osteotomy, elevating anterior flap with some comminution. The stem eventually was removed after finding just bony ingrowth posteriorly and very distal portion of the porous coating.   After stem removal, attention was turned to the acetabulum. With the stem no longer blocking the view, the acetabulum could be addressed. The head was removed. The liner was removed again with difficulty. The metal liner did not come out easily. Tapping with tamp and getting circumferential exposure, it took some time to get this liner out. After the liner was removed, the single screw was identified and removed without difficulty. The acetabular curved cutting instrumentation was then used after putting the liner back in, and the cup was removed with a 56-mm cutting guide. The cup itself measured 56 mm, but there was a lip around the periphery, so a 58 was used. The acetabulum was then reamed to 58 and a 58 cup inserted with 2 screws, giving it apparent stable fixation with acceptable position on AP imaging under fluoroscopy. The polyethylene liner was then impacted in place and was stable.   Attention was then turned back to the femur. With the leg  in extension and adduction, sequential hand broaching was carried out on the distal femur and trial placed. X-rays were used during this procedure. Again, this gave good visualization. It appeared that a stable taper fit of the distal stem was obtained. Proximal conical trials were placed and checked under  fluoroscopy. When final components were chosen, the [facility] modular revision distal stem was impacted into place 15 mm x 150 mm, and it gave a very nice tight fit distally. A proximal Arcos modular revision system proximal cone size C with high offset 70 mm was impacted as well, and the set screw tightened with the torque wrench. It appeared stable with anteversion determined based on the prior trialing. A 0 head was chosen 32 mm. This was impacted, and the hip was reduced with some traction applied to restore some of the previously lost length. The hip appeared stable to a 90-degree external rotation test, and postop x-ray showed that we had restored some of the length that had previously been lost on prior surgery. The hip was thoroughly irrigated with pulsatile lavage several times during the procedure. This was a very extensive procedure with significant blood loss. The deep fascia was repaired, along with a portion of the TFL that had been elevated anteriorly to allow for adequate exposure for the femoral preparation. A heavy quill suture was used for the deep fascia, 2-0 Vicryl for the subcutaneous skin, followed by staples, Mepitel, and a wound VAC to prevent postop hematoma. The removed components were photographed.   INTRAVENOUS FLUIDS GIVEN:  3500.   ESTIMATED BLOOD LOSS:  4500 with 2925 mL given back from Cell Saver.   FOLEY OUTPUT:  700 mL.   IMPLANTS:  Biomet Regenerex RingLoc 58 mm acetabular cup with 2 bone screws within the cup, an E1 acetabular liner size 24 with a 32 mm 0 head, an Arcos revision stem 15 x 150 mm with the previously mentioned size C high offset 70 mm Arcos modular proximal cone.   CONDITION TO RECOVERY ROOM:  Fair with the patient having some hypotension during the procedure and requiring transfusion of 1 unit packed cells.   COMPLICATIONS:  None.      ____________________________ Laurene Footman, MD mjm:ms D: 10/22/2012 19:28:06 ET T: 10/22/2012 23:24:12  ET JOB#: [id number]  cc: Laurene Footman, MD, <Dictator> Laurene Footman MD ELECTRONICALLY SIGNED 10/23/2012 7:22

## 2014-05-20 NOTE — Discharge Summary (Signed)
PATIENT NAME:  Richard Duncan, Richard Duncan MR#:  [id number] DATE OF BIRTH:  [birth year]  DATE OF ADMISSION:  10/22/2012 DATE OF DISCHARGE:  10/26/2012  ADDENDUM:  To a discharge summary from 10/26/2012.  I left off yesterday at the complications with surgery and the next part would be the history.  HISTORY OF PRESENT ILLNESS: The patient is a 64 year old male who has had bilateral pain from metal-on-metal hips. The patient has been having moderate to severe pain since the right hip was replaced. Pain has progressively gotten worse. He has had right hip pain. His right is greater than his left. He has been followed by Dr. Rudene Christians who recently got a bone scan and did not show any loosening or blood pooling images or for any suspicious cyst. Unfortunately, the patient does have a pacemaker and was unable to obtain a MRI. He continues to have significant pain with range of motion and difficulty with ambulation as well as flexion of the right hip. He has been noted to have shortening of both legs. 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 He has a history of chronic pain with prior lumbar surgery as well as laminectomy and cervical surgery. He is currently not taking any medications.   PHYSICAL EXAMINATION: GENERAL: Well developed, well-nourished and in no apparent distress. Normal affect. Significant touch component in bilateral lower extremities.  HEART: Regular rate and rhythm.  LUNGS: Clear to auscultation bilaterally. No wheezing, rales or rhonchi.  HEENT: Head normocephalic, atraumatic. Pupils equal, round and reactive light.  RIGHT HIP: Shows the patient has no swelling, warmth or erythema present. He has slight flexion contracture. His posterior hip incision is healed. There is no sign of infection. He is nontender over the incision site. He is neurovascularly intact in the right lower extremity. He has significant pain with internal and external rotation of the right hip. He has 20  degrees of internal rotation, 30 degrees of external rotation.   HOSPITAL COURSE: The patient was admitted to the hospital on 10/22/2012. He had surgery that same day and was taken to the CCU for close observation due to increased blood loss after the surgery. The patient was in the CCU and was followed by medicine. The patient was found to be hypotensive as well as in acute renal failure. Postop day 1 labs showed the patient had a creatinine of 1.84, as well as a hemoglobin of 14.6. On postop day 2, the patient's creatinine was back down to 1.23, which was baseline, and his hemoglobin had dropped to 10.4. On postop day 2, the patient had slow progress with physical therapy. He was moved back to the orthopedic floor and was noted to have some urinary retention so Foley was placed, and he started bladder training. On postop day 3, the patient's hemoglobin was down to 9.5. He was stable. He did not have any chest pain or shortness of breath. His vital signs were stable and hypotension was resolved. The patient was doing well. He was having minimal hip pain. He was having slow progress with physical therapy, but was able to ambulate with crutches up to 15 feet. He continued to have urinary retention. On postop day 4, the patient's hemoglobin was down to 9.2. He continued to be medically stable. Vital signs were stable. He continued to have urinary retention. On postop day 5, the patient was doing well. Urology was consulted for urinary retention. He was started on Flomax and it was decided on that he would follow up with  his urologist early next week. The patient will continue with Foley catheter. The patient's vital signs and labs were stable. He was ready for discharge to a rehab facility with follow-up appointments with orthopedics as well as urology.   CONDITION AT DISCHARGE: Stable.   DISCHARGE INSTRUCTIONS: The patient had right total hip revision, posterior hip precautions and thigh-high TED hose that  should be on both legs and remove 1 hour per 8 hour shift and use incentive spirometry every hour while awake, encourage cough and deep breathing. Toe-touch weight-bearing, right lower extremity. He may resume a regular diet as tolerated. Apply an ice pack to the affected area. Do not get the dressing or bandage wet or dirty. Call [facility] orthopedics if any of the dressing gets water under it. Leave the dressing on. Call [facility] orthopedics if any of the following occur: Bright red bleeding from the incision or wound, fever above 101.5 degrees, redness, swelling or drainage at the incision. Call [facility] orthopedics if you experience any increased leg pain, numbness or weakness in our legs or bowel or bladder symptoms. Physical therapy and occupational therapy; he will undergo these at rehab facility. He has a Foley catheter that will need to be left in place until early next week. He needs to follow up with Dr. Orlie Pollen office or Zara Council PA-C which is recommended for voiding trial and catheter removal first of next week. Call [facility] orthopedics for any follow-up appointments. He needs to follow up with [facility] orthopedics in 2 weeks.   DISCHARGE MEDICATIONS: 1.  Benazepril 20 mg 1 tablet orally once a day in the morning. 2.  Citalopram 40 mg 1 tablet orally once a day at bedtime.  3.  Diazepam 5 mg 1 to 2 tabs orally once a day in the morning.  4.  Vitamin D3 1000 international units 2 caps orally once a day in the morning.  5.  Amlodipine 10 mg oral tablet 1 tablet orally once a day in the morning. 6.  Tylenol 500 mg oral tablet 1 tablet orally every 4 hours as needed for pain or temp greater than 100.4. 7.  Oxycodone 5 mg oral tablet 1 tablet orally every 4 hours as needed for pain.  8.  Magnesium hydroxide 8% oral suspension 30 mL orally 2 times a day as needed for constipation. 9.  Lovenox 40 mg subcutaneous once a day for 14 days.  10.  Aspirin 81 mg oral delayed  release tablet 1 tablet orally once a day. 11.  Bisacodyl 10 mg rectal suppository 1 suppository rectally once a day as needed for constipation. 12.  Phenazopyridine 100 mg oral tablet 1 tablet orally 3 times a day after meals.  13.  Tamsulosin 0.4 mg oral capsule 1 cap orally once a day after a meal.   ____________________________ T. Rachelle Hora, PA-C tcg:sb D: 10/27/2012 09:54:00 ET T: 10/27/2012 10:08:04 ET JOB#: [id number]  cc: T. Rachelle Hora, PA-C, <Dictator> Duanne Guess [place] ELECTRONICALLY SIGNED 11/04/2012 9:06

## 2014-05-21 NOTE — Discharge Summary (Signed)
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   OPERATION: On 07/22/2013, the patient had a revision of a femoral head component with attempt at revision of femoral stem and acetabular components.   ANESTHESIA: Spinal.   SURGEON: Hessie Knows, M.D.   ASSISTANT: Reche Dixon, PA-C  ESTIMATED BLOOD LOSS: 2950 mL with approximately 1550 mL re-infused.   COMPLICATIONS: None.   IMPLANTS: Biomet Biolox delta ceramic head, 32 mm, with Biolox option taper adapter, and +6 neck.   DISPOSITION: The patient was stable with no complications. The patient was taken to the recovery room then brought down to the orthopedic floor.   HISTORY: The patient is a 64 year old male who presented for upcoming total hip revision on the left side. The patient had a rigid left total hip replacement in 2004 in [place] and has had persistent progressive pain involving the left hip and groin. The patient had a right total hip revision for his metal-on-metal hip, and did quite well with this, this year. The patient has no specific back pain or radicular symptoms.   PHYSICAL EXAMINATION: GENERAL: Well-developed, well-nourished male with an antalgic gait on the left with mild limping component.  HEART: Regular rate and rhythm.  LUNGS: Clear to auscultation.  MUSCULOSKELETAL: In regard to the left hip, the patient has minimal tenderness of the intertrochanteric region. The patient has moderate pain with internal and external rotation. The patient has 20 degrees internal rotation and 30 degrees external rotation. The patient has 100 degrees of flexion with pain. The patient does have some weakness with hip flexion. NEUROVASCULAR: Essentially is intact.   HOSPITAL COURSE: After initial admission  on 07/22/2013, the patient had a hemoglobin that first night of 15.5 and it was down to 12.9 on the 26th and then dropped down to 11.9 on postop day 2. No transfusions were given besides what he received intraoperatively and did quite well with this. The patient did work with physical therapy and was bed to chair with toe-touch weight-bear on the left side. The patient was able to ambulate 50 feet including doing 1 stair before discharge. The patient did not have any other complications and was ready to go home on 07/26/2013.   CONDITION AT DISCHARGE: Stable.   DISPOSITION: The patient was sent home with home health physical therapy.   DISCHARGE INSTRUCTIONS: The patient follow up with [facility] orthopedics in 2 weeks for staple removal. The patient will do physical therapy at home doing gait training with toe-touch weight-bear and range of motion activities, also working on strengthening. The patient will elevate his leg with 1 to 2 pillows and use a knee-high TED hose on both legs. The patient will be encouraged to do cough and deep breathing. The patient will do a regular diet. The patient will have a dressing change on a p.r.n. basis with physical therapy. The patient will call the clinic if there is any bright red bleeding or calf pain or bowel or bladder difficulty or any fever greater than 101.5.   DISCHARGE INSTRUCTIONS: Are to resume home medication and to add Tylenol 500 mg 1 tablet every 4 hours as needed for pain or fever greater than 100.4, oxycodone 5 mg 1 tablet q. 4 hours as needed for pain, Lovenox 40 mg subcu once a day for 14  days and then discontinue and begin aspirin 81 mg once a day.  ____________________________ J. Reche Dixon, [place] jtm:sb D: 07/26/2013 06:42:39 ET T: 07/26/2013 07:03:46 ET JOB#: [id number]  cc: J. Reche Dixon, [place], <Dictator> J Khallid Pasillas [facility] PA ELECTRONICALLY SIGNED 07/27/2013 16:16

## 2014-05-21 NOTE — Op Note (Signed)
PATIENT NAME:  Richard Duncan, Richard Duncan MR#:  [id number] DATE OF BIRTH:  March 06, 1950.  DATE OF PROCEDURE:  07/22/2013.  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  PROCEDURE: Revision of femoral head component. Attempted revision of femoral stem and acetabular components.   ANESTHESIA: Spinal.   SURGEON: Hessie Knows, M.D.   ASSISTANT: Reche Dixon, PA-C.   DESCRIPTION OF PROCEDURE: The patient was brought to the operating room and after adequate spinal anesthesia was obtained the left leg was prepped and draped in the usual sterile fashion with C-arm having been brought in and initial x-ray obtained. After patient identification and timeout procedures were completed a direct anterior approach was made with exposure of the TF tensor muscle. The TFL fascia was incised and the muscle retracted laterally and the capsule exposed. The lateral femoral circumflex vessels were ligated and the anterior capsule entered. After adequate exposure had been obtained the head was removed from the stem with the use of bone tamp and left in the acetabular component. Capsular release was carried out and the stem exposed.  A trochanteric osteotomy was created to allow for adequate mobilization of the femoral component.  Thin, flexible osteotomes were used and approximately 2 hours were  utilized trying to remove the femoral stem, however, it would not move.  Rather than splitting the entire femur the decision was made to leave the stem in place.   Attention was then turned to the acetabular component. With the femoral head removed the acetabular component appeared to be in stable condition. Bone tamp was used to try to separate the metal liner. Again, extensive effort was carried out to do this and the metal liner would not separate. Because of the inability to remove the liner the screws in the acetabular component could not be removed so the acetabular  component could not be removed.  As an alternative to revision of all components the decision was made to leave the components in place but change out the femoral head for a non-metal-on-metal component. A+6 aluminum sleeve was then assembled to the trunnion of the femoral component with a ceramic head then placed onto this aluminum sleeve. This gave additional length. After reduction the hip was stable to 90 degrees external rotation and leg length was improved.   The wound was thoroughly irrigated with pulsatile lavage and closed with a deep running heavy Quill suture, a subcutaneous drain, subcutaneous 2-0 Quill and skin staples. Xeroform, 4 x 4s, ABD and tape were applied. The patient was sent to the recovery room in stable condition.   ESTIMATED BLOOD LOSS:  2950 mL with approximately 1550 mL reinfused.   SPECIMENS: None.   COMPLICATIONS: None.   IMPLANTS: Biomet Biolox-Delta ceramic head, 32 mm, with as Biolox Option taper adapter, and +6 neck.    DISPOSITION:  The patient was sent to the recovery room in stable condition.    ____________________________ Laurene Footman, MD mjm:lt D: 07/23/2013 07:01:41 ET T: 07/23/2013 09:21:28 ET JOB#: [id number]  cc: Laurene Footman, MD, <Dictator> Laurene Footman MD ELECTRONICALLY SIGNED 07/23/2013 18:45

## 2014-05-22 NOTE — Consult Note (Signed)
PATIENT NAME:  Richard Duncan, Richard Duncan MR#:  [id number] DATE OF BIRTH:  [birth year]  DATE OF CONSULTATION:  06/26/2011  REFERRING PHYSICIAN:   CONSULTING PHYSICIAN:  Dionisio David, MD  INDICATION FOR CONSULTATION: Evaluate for pacemaker implantation.  HISTORY OF PRESENT ILLNESS: This is a 64 year old white male with a past medical history of hypertension who takes 100 mg of atenolol and two other antihypertensives he does not remember the name of. He is followed at [facility]. He came in today because he was feeling dizzy, lightheadedness, and had tightness in the chest and he felt diaphoretic and nauseous. He did not quite vomit. He felt like he was going to pass out. Thus, he came into the Emergency Room. In the Emergency Room Dr. 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 He was given 0.5 mg of atropine without any significant change in the heart rate. Heart rate is still in the 40's but hemodynamically his blood pressure is stable at 130/70.   PAST MEDICAL HISTORY: History of hypertension. No history of diabetes or hypercholesterolemia.   SOCIAL HISTORY: Denies EtOH abuse or smoking.   FAMILY HISTORY: Unremarkable.   PHYSICAL EXAMINATION:   VITAL SIGNS: His blood pressure 130/70, pulse 41, respirations 14. He is afebrile.   NECK: No JVD.   LUNGS: Clear.   HEART: Irregular. Normal S1, S2. Heart rate 41.   ABDOMEN: Soft, nontender. Positive bowel sounds.   EXTREMITIES: No pedal edema.   NEUROLOGIC: The patient appears to be intact.   LABORATORY, DIAGNOSTIC, AND RADIOLOGICAL DATA: EKG shows sinus bradycardia, 41 beats per minute with every third beat to second beat dropping, appears to be type II second degree AV block. Cardiac enzymes are negative.   ASSESSMENT AND PLAN: High-grade AV block not responding to 0.5 mg atropine, however, hemodynamically stable.  Denies any chest pain or dizziness right now. Advised admitting the patient for observation in the ICU with external pacemaker patch and to place the patient if the heart rate drops below 35. I feel that the patient was on a very high dose of atenolol, 100 mg, which will be held. It has a half life of 48 hours so I think we will have to wait until tomorrow to see how he is doing before considering that he needs a permanent pacemaker. At this time I do not feel he needs temporary pacemaker. As far as his chest pain is concerned, this was occurring while his heart rate was in the 30's and 40's but he does have risk factors for coronary artery disease. Once his heart rate is stable, evaluation for ischemia can be done as an outpatient.   Thank you very much for the referral.   ____________________________ Dionisio David, MD sak:drc D: 06/26/2011 20:10:09 ET T: 06/27/2011 05:53:01 ET JOB#: [id number]  cc: Dionisio David, MD, <Dictator> Dionisio David MD ELECTRONICALLY SIGNED 07/08/2011 15:47

## 2014-05-22 NOTE — Op Note (Signed)
PATIENT NAME:  Richard Duncan, Richard Duncan MR#:  [id number] DATE OF BIRTH:  1950/06/09  DATE OF PROCEDURE:  06/28/2011  ATTENDING PHYSICIAN: Dr. Neoma Laming  ATTENDING CARDIOLOGIST: Dr. Humphrey Rolls  IMPLANTING PHYSICIAN: Dr. Lavera Guise  PROCEDURE: Insertion of permanent pacemaker.   PREOPERATIVE DIAGNOSIS: High-grade AV block, 2:1 block with bradycardia with a heart rate dropping up to 20.   POSTOPERATIVE DIAGNOSES:  1. High-grade AV block, 2:1 block with bradycardia with a heart rate dropping up to 20.  2. Successful insertion of a DDD pacemaker.   PROCEDURE NOTE: Patient was taken to the Operating Room. Left upper chest was prepared with Hibiclens and patient was prepped in a sterile fashion. Next, 1% Xylocaine was infiltrated under the skin and subcutaneous tissue and then incision was made on the skin overlying the deltopectoral groove. Cephalic vein could not be isolated so patient was put in Trendelenburg position and two separate sticks were made for entry into the subclavian vein on the left side, one for the atrial channel, one for the ventricular channel. Atrial lead and ventricular leads were introduced through the separate channels and the leads were crossed with the pacemaker pocket, which was already created in the superficial fascia and both leads were tightened on the top of the suture sleeve. Stimulation thresholds were as follows. Pacemaker [place], serial # [id number]. Both leads were checked for the sensitivity three times and the sensitivity was stable. Ventricular atrial thresholds were satisfactory so both leads were hooked to the pacemaker making sure that atrial lead goes into the atrial channel, ventricular lead goes into the ventricular channel, and all the connections were tightened securely according to company specification. Pacemaker was inserted into the pacemaker pocket, which was already fashioned under the superficial fascia. All the bleeding points were cauterized and cavity was  closed with subcutaneous and nylon sutures. Patient tolerated the procedure well and returned to the recovery room in stable condition.     Patient will have follow up appointment for the removal of the stitches in about eight days.   ____________________________ Cletis Athens, MD jm:cms D: 06/28/2011 17:36:00 ET T: 06/29/2011 11:12:59 ET JOB#: [id number]  cc: Cletis Athens, MD, <Dictator> Dionisio David, MD Cletis Athens MD ELECTRONICALLY SIGNED 07/09/2011 14:18

## 2014-05-22 NOTE — Discharge Summary (Signed)
PATIENT NAME:  Richard Duncan, Richard Duncan MR#:  [id number] DATE OF BIRTH:  Jun 11, 1950  DATE OF ADMISSION:  06/26/2011 DATE OF DISCHARGE:  06/29/2011  PRIMARY CARE PHYSICIAN:  Dr. Ellison Hughs    FINAL DIAGNOSES:  1. Heart block and bradycardia.  2. Hypertension.  3. Sleep apnea.  4. Anxiety.   MEDICATIONS ON DISCHARGE:  1. Valium 10 mg 3 times a day as needed.  2. Benazepril 20 mg daily.  3. Celexa 40 mg at bedtime.  4. Felodipine 10 mg daily.  5. Tylenol every six hours as needed for pain. 6. Aspirin 81 mg daily.  7. Vitamin D 2000 international units daily.  8. Zinc 1 tablet daily.  Do not take atenolol.   NOTE: Do not lift arm above shoulder height for six weeks as stitches in the arm have been closed. Keep area clean and dry. Keep the bandage in place.   FOLLOWUP: Follow up with Dr. Lavera Guise at the end of the week.   ACTIVITY: As tolerated.   DIET: Low sodium diet.   REASON FOR ADMISSION: The patient was admitted 06/26/2011 and discharged 06/29/2011, was admitted with lightheadedness and dizziness.   HISTORY OF PRESENT ILLNESS: 64 year old man with obesity, kidney stones, sleep apnea,   anxiety, and hypertension. He developed lightheadedness, dizziness, and nausea, and was found to be in second-degree heart block at the [facility] [place] and sent to the ER. He was seen in consultation by Dr. Neoma Laming who recommended stopping the atenolol and admitting to telemetry. The patient's heart rate still remained low and Dr. Lavera Guise was consulted for pacemaker placement.  Pacemaker was placed on 06/28/2011 and he was discharged home on 06/29/2011.   LABORATORY AND RADIOLOGICAL DATA: Sinus rhythm, second-degree AV block, Mobitz type II, right bundle branch block. Glucose 76, BUN 19, creatinine 0.99, sodium 146, potassium 3.7, chloride 111, CO2 24, calcium 8.2. Liver function tests normal. White blood cell count 8.8, hemoglobin and hematocrit 14.7 and 42.5, platelet count 169. PT/INR, PTT  normal range. Chest x-ray: No acute changes. Troponins were negative. TSH 0.468. Echocardiogram showed dilated left atrium. The rest of the chambers are normal size. Trace mitral regurgitation. Ejection fraction greater than 55%. Repeat chest x-ray showed mild atelectasis.  Lungs otherwise  clear. No comment on the pacemaker.   HOSPITAL COURSE PER PROBLEM LIST:  1. Second-degree heart block and bradycardia: The patient required initially stopping atenolol but still bradycardic, requiring pacemaker by Dr. Lavera Guise. Pacemaker was placed. The patient was watched overnight and discharged home in stable condition. The patient received prophylactic antibiotics before and after the procedure. The patient's symptoms had resolved.  2. Hypertension: Blood pressure upon discharge was 127/85. The patient will be held off on the atenolol.  3. Sleep apnea and CPAP at night: Weight loss recommended. 4. Anxiety: He is on Celexa and diazepam.   The patient was discharged home in stable condition.   TIME SPENT ON DISCHARGE: 35 minutes.   ____________________________ Tana Conch. Leslye Peer, MD rjw:bjt D:  06/29/2011 14:56:22 ET         T: 06/29/2011 15:08:40 ET       JOB#: [id number]  cc:  Sofie Hartigan, MD Cletis Athens, MD Marisue Brooklyn MD ELECTRONICALLY SIGNED 06/30/2011 14:07

## 2014-05-22 NOTE — Consult Note (Signed)
PATIENT NAME:  Richard Duncan, Richard Duncan MR#:  [id number] DATE OF BIRTH:  1950-07-13  DATE OF CONSULTATION:  06/27/2011  REFERRING PHYSICIAN:   INTERPRETING PHYSICIAN:  Cletis Athens, MD  HISTORY: [name] was admitted into the hospital with dizziness, nausea, headache, and pressure in the lower chest. He was seen in the emergency room and his heart rate was found to be 20 to 30 with a 2:1 block, so he was admitted into the hospital for further evaluation on telemetry. The patient denies any history of heart attack or stroke. He does not smoke at the present time and does not drink. He used to smoke in the past. He drank moderately in the past but has stopped drinking. He has lost about 100 pounds The patient is fairly active and denies any history of chest pain on exertion. He is on disability because of back surgery twice and neck fusion. He is also known to have prostate cancer.   PAST MEDICAL HISTORY: 1. Nephrolithiasis.  2. Sleep apnea.  3. Prostate cancer. 4. Anxiety. 5. Carpal tunnel syndrome.  6. Hypertension.  7. Fibromyalgia.  8. Chronic back pain.  9. Status post bilateral hip replacement.   PHYSICAL EXAMINATION:   GENERAL: The patient is a well-nourished male in no acute distress at the time of examination.   VITALS: Heart rate is 40. Telemetry revealed 2:1 block with a slow heart rate, dropping to below 30.    HEENT: Head normocephalic. No icterus. Pupils were equal and reactive to light. There was no bruit.   HEART:  Apical impulse is not palpable. Both heart sounds are distant. No murmur is audible.   LUNGS: No wheezing or rales.   ABDOMEN: Soft and nontender without any hepatosplenomegaly.   NEUROLOGIC: Normal.   IMPRESSION: High-grade AV block with interventricular conduction defect. The patient has a history of dizziness and near passing out. I think he needs a permanent pacemaker. This was discussed with the patient and his wife and they are agreeable for that. I will  schedule him for insertion of pacemaker tomorrow. The risks and benefits of permanent pacemaker therapy were explained to the patient in detail.  ____________________________ Cletis Athens, MD jm:slb D: 06/27/2011 14:19:00 ET T: 06/27/2011 14:32:23 ET JOB#: [id number]  cc: Cletis Athens, MD, <Dictator> Cletis Athens MD ELECTRONICALLY SIGNED 06/28/2011 13:49

## 2014-05-22 NOTE — H&P (Signed)
PATIENT NAME:  Richard Duncan, Richard Duncan MR#:  [id number] DATE OF BIRTH:  1950-06-07  DATE OF ADMISSION:  06/26/2011  REFERRING PHYSICIAN: ER physician, Dr. Roxine Caddy    PRIMARY CARE PHYSICIAN: Dr. Ellison Hughs   CHIEF COMPLAINT: Lightheadedness, dizziness.   HISTORY OF PRESENT ILLNESS: The patient is a 64 year old male with past medical history of obesity, kidney stones, sleep apnea, anxiety, and hypertension who was in his usual state of health until Monday when he was doing some household chores at his daughter's place which involved a little bit of exertion when he developed lightheadedness, dizziness, and nausea. The patient decided to take a rest and was well the next day. However, today he went back to his daughter's house to finish up the chores and subsequently again developed lightheadedness, dizziness, nausea, and some headache. Therefore, he went to [facility] where he was found to be in second degree heart block and was sent to the Emergency Room. EKG in the Emergency Room confirmed sinus bradycardia with second degree AV block. The patient was evaluated by cardiologist, Dr. Neoma Laming, the ED who felt that the patient's heart block is resulting from use of high dose of atenolol and felodipine. He recommended holding the patient's calcium channel blocker and beta-blocker and admitting to telemetry floor for further monitoring.   ALLERGIES: Propofol.  PAST MEDICAL HISTORY:  1. History of kidney stones.  2. Sleep apnea, uses CPAP at night. 3. Prostate cancer. 4. Panic attacks. 5. Anxiety.  6. Carpal tunnel syndrome.  7. Hypertension. 8. Fibromyalgia. 9. Degenerative joint disease. 10. Chronic back pain.   PAST SURGICAL HISTORY:  1. Back surgery. 2. Cervical fusion. 3. Bilateral hip replacement.  4. Appendectomy.  5. Tonsillectomy.   SOCIAL HISTORY: Denies any history of smoking, alcohol, or drug abuse. He is married. Lives with his wife.   FAMILY HISTORY: Mother had  hypertension. Father had throat cancer.   REVIEW OF SYSTEMS: CONSTITUTIONAL: Denies any fever. Reports fatigue, weakness. EYES: Denies any blurred or double vision. ENT: Denies any tinnitus or ear pain. RESPIRATORY: Denies any cough, wheezing. CARDIOVASCULAR: Reports some chest pressure. Denies any palpitations or syncope. GI: Denies any vomiting, diarrhea, or abdominal pain. Was nauseous. GU: Denies any dysuria or hematuria. ENDOCRINE: Denies any polyuria or nocturia. HEME/LYMPH: Denies any anemia or easy bruisability. INTEGUMENTARY: Denies any acne or rash. MUSCULOSKELETAL: Denies any swelling or gout. NEUROLOGICAL: Denies any numbness or weakness. PSYCH: Currently has a history of anxiety and panic attacks.   PHYSICAL EXAMINATION:   VITAL SIGNS: Temperature 98.1, heart rate 47, respiratory rate 18, blood pressure 130/68, pulse oximetry 98%.   GENERAL: The patient is an obese Caucasian male laying comfortably in bed not in acute distress.    HEAD: Atraumatic, normocephalic.   EYES: There is no pallor, icterus, or cyanosis. Pupils equal, round, and reactive to light and accommodation. Extraocular movements intact.    ENT: Wet mucous membranes. No oropharyngeal erythema or thrush.   NECK: Supple. No masses. No JVD. No thyromegaly. No lymphadenopathy.   CHEST WALL: No tenderness to palpation. Not using accessory muscles of respiration. No intercostal muscle retractions.   LUNGS: Bilaterally clear to auscultation. No wheezing, rales, or rhonchi.   CARDIOVASCULAR: S1, S2 regular, bradycardic. No murmur, rubs, or gallops.   ABDOMEN: Soft, obese, nontender, nondistended. No guarding. No rigidity. No organomegaly. Normal bowel sounds.   SKIN: No rashes or lesions.   PERIPHERIES: Trace pedal edema. 1+ pedal pulses.   MUSCULOSKELETAL: No cyanosis or clubbing.   NEUROLOGIC: Awake,  alert, oriented x3. Nonfocal neurological exam. Cranial nerves grossly intact.   PSYCH: Normal mood and affect.    LABORATORY, DIAGNOSTIC, AND RADIOLOGICAL DATA: The patient's labs have been ordered and are currently pending. EKG shows second degree AV block with sinus bradycardia.   ASSESSMENT AND PLAN: This is a 64 year old male with history of hypertension, sleep apnea, and anxiety who presents with dizziness and nausea.  1. Sinus bradycardia with second degree heart block possibly due to high dose of atenolol and felodipine. The patient was evaluated by Dr. Humphrey Rolls in the ED who recommended conservative management. Will place the patient on telemetry. Will hold his calcium channel blocker and beta-blocker. Advised to keep an ampule of atropine at bedside and has pacer pads in place on the patient. Dr. Neoma Laming will follow-up in the morning. Will also check orthostatics. 2. Chest pressure possibly related to the symptomatic bradycardia. Will check serial cardiac enzymes. Continue the patient's aspirin.  3. History of sleep apnea. Will continue CPAP at night. 4. History of anxiety and panic attacks. Will continue citalopram.  5. History of hypertension. Will hold calcium channel blocker and beta-blocker for the time being. Will continue ACE inhibitor. Will adjust medications as needed to achieve good hypertensive control.  Reviewed all medical records, discussed with the ED physician, discussed with Dr. Neoma Laming, discussed with the patient and his wife the plan of care and management.   TIME SPENT: 75 minutes.   ____________________________ Cherre Huger, MD sp:drc D: 06/26/2011 20:40:22 ET T: 06/27/2011 07:47:42 ET JOB#: [id number]  cc: Cherre Huger, MD, <Dictator> Sofie Hartigan, MD Cherre Huger MD ELECTRONICALLY SIGNED 06/29/2011 9:31

## 2014-05-23 LAB — SURGICAL PATHOLOGY

## 2014-05-29 NOTE — Op Note (Signed)
PATIENT NAME:  Richard Duncan, Richard Duncan MR#:  [id number] DATE OF BIRTH:  1950/10/26  DATE OF PROCEDURE:  05/05/2014  Location: Operating Room Referring Physician: Leticia Penna, MD Procedure by: Kathlen Brunswick. 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 He comes in to the clinic today for a bilateral lumbar spinal cord stimulator, a trial implant under fluoroscopic guidance and IV sedation.    Procedure(s):  1. Temporary (Trial) implantation of Lumbar Epidural, Double Percutaneous Neurostimulator Leads (16 electrode array). 2. Fluoroscopic Needle Guidance 3. Intraoperative Analysis and Programming. 4. Postoperative Analysis and Programming. 5. Moderate Conscious Sedation  Surgeon: Arby Dahir A. Dossie Arbour, M.D. Side of implant: Bilateral Top electrode tip level: T8.   Diagnostic Indications: Chronic Lumbosacral Radiculopathy/Radiculitis. Failed Back Surgery Syndrome.  Position: Prone.  Prepping solution: DuraPrep Area prepped: The thoracolumbar and sacral areas, were prepped with a broad-spectrum topical antiseptic microbicide. Target area: Lumbar Epidural Space, around the T8-9 vertebral body, for the electrode tip. Insertion site is the L2-3 intervertebral space. Level entered: T12-L1.   Number of attempts: one  Infection Control: Standard Universal Precautions taken (Respiratory Hygiene/Cough Etiquette; Mouth, nose, eye protection; Hand Hygiene; Personal protective equipment (PPE); safe injection practices; and use of masks and disposable sterile surgical gloves) as recommended by the Department of [place] for Disease Control and Prevention (CDC).  Safety Measures: Allergies were reviewed. Appropriate site, procedure, and patient were confirmed by following the Joint Commission's Universal  Protocol (UP.01.01.01). The patient was asked to confirm marked site and procedure, before commencing. The patient was asked about blood thinners, or active infections, both of which were denied. No attempt was made at seeking any paresthesias. Aspiration looking for blood return was conducted prior to injecting. At no point did we inject any substances, as a needle was being advanced.  Pre-procedure Assessment:  A medical history and physical exam were obtained. Relevant documentation was reviewed and verified. Prior to the procedure, the patient was provided with an Audio CD, as well as written information on the procedure, including side-effects, and possible complications. Under the influence of no sedatives, a verbal, as well as a written informed consent were obtained, after having provided information on the risks and possible complications. To fulfill our ethical and legal obligations, as recommended by the American Medical Association's Code of Ethics, we have provided information to the patient about our clinical impression; the nature and purpose of an available treatment or procedure; the risks and benefits of an available treatment or procedure; alternatives; the risk and benefits of the alternative treatment or procedure; and the risks and benefits of not receiving or undergoing a treatment or procedure. The patient was provided information about the risks and possible complications associated with the procedure. These include, but not limited to, failure to achieve desired goals, infection, bleeding, organ or nerve damage, allergic reactions, paralysis, and death. In addition, the patient was informed that Medicine is not an exact science; therefore, there is also the possibility of unforeseen risks and possible complications that may result in a catastrophic outcome. The patient indicated having understood very clearly.  We have given the patient no guarantees and we have made no promises. Ample  time was given to the patient to ask questions, all of which were answered, to the patients satisfaction, before proceeding. The patient understands that by signing our informed consent form, they  understand and accept the risks and the fact that it is impossible to predict all possible complications. Baseline vital signs were taken and the medical assessment was completed. Verification of the correct person, correct site (including marking of site), and correct procedure were performed and confirmed by the patient. Baseline vital signs were taken and the initial assessment was completed. Verification of the correct person, correct site (including marking of site), and correct procedure were performed and confirmed by the patient, in the form of a Time Out.  Monitoring: The patient was monitored in the usual manner, using NIBPM, ECG, and pulse oximetry.  IV Access:  An IV access was obtained and secured.  Analgesia:  Moderate (Conscious) Intravenous sedation: Consent was obtained before administering any sedation. Availability of a responsible, adult driver, and NPO status confirmed. Meaningful verbal contact was maintained, with the patient at all times during the procedure. ASA Sedation Guidelines followed. For specifics on pharmacological type and quantity of sedation, please see nursing chart.  Prophylactic Antibiotics:  Cipro 400 mg IV.    Local Anesthesia: Lidocaine 1%. The skin over the procedure site were infiltrated using a 3 ml Luer-Lok syringe with a 0.5 inch, 25-G needle. Deeper tissues were infiltrated using a 3.0 inch, 22-G spinal needle, under fluoroscopic guidance.  Fluoroscopy: The patient was taken to the operative suite, where the patient was placed in position for the procedure, over the fluoroscopy compatible table. Fluoroscopy was manipulated, using Tunnel Vision Technique, to obtain the best possible view of the target area, on the affected side. Parallax error was corrected  before commencing the procedure. Gabor Raczs Direction-Depth-Direction technique was used to introduce the procedural needle under continuous pulsed fluoroscopic guidance. Once the target was reached, antero-posterior and lateral fluoroscopic views were taken to confirm needle placement in two planes. Fluoroscopy time: Please see the patients chart for details.  Description of the procedure: The procedure site was prepped using a broad-spectrum topical antiseptic. The area was then draped in the usual and standard manner. "Time-out" was performed as per JC Universal Protocol (UP.01.01.01).   The skin and deeper tissues over the procedure site were infiltrated using 1% lidocaine, loaded in a 10 ml Luer-Lok syringe with a 0.5 inch, 25-G needle. The procedural needle was then introduced through the skin and deeper tissues, using Gabor Raczs Direction-Depth-Direction technique, under pulsed fluoroscopic guidance. No attempt was made at seeking a paresthesia. The paramidline approach was used to enter the posterior lumbar epidural space at a 30 degree angle, using Loss-of-resistance Technique with 3 ml of PF-NaCl (0.9% NSS) + 0.5 ml of air, in a 5 ml glass syringe, using a loss-of-bounce technique, at the desired level. Correct needle placement was confirmed in the  antero-posterior and lateral fluoroscopic views. The epidural lead was gently introduced under real-time fluoroscopy, constantly assessing for pain or paresthesias, until the tip was observed to be at the target level, on the side ipsilateral to the pain. The placement of the second lead was accomplished in a similar fashion. Once the target was thought to have been reached, antero-posterior and lateral fluoroscopic views were taken to confirm electrode placement in two planes. Placement was tested until a comfortable stimulation pattern was observed over the usual painful area. Once the patient had assured [place] that the stimulation was in the correct  pattern, and distribution, we proceeded to remove the 15-G Tuohy epidural needle(s). This was done while observing the electrode tip(s) under real-time fluoroscopy to prevent movement. The leads were then fixed to the  skin using silk 2-0 suture. Benzoin was applied to the area, and 6 (six) 67M, 1/2"X4", reinforced, adhesive Steri Strips were used to further secure the lead into place. A sterile transparent dressing was then used to cover the lead, in order to assess any evidence of infection in the future.  The patient tolerated the entire procedure well. A repeat set of vitals were taken after the procedure and the patient was kept under observation until discharge criteria was met. The patient was provided with discharge instructions, including a section on how to identify potential problems. Should any problems arise concerning this procedure, the patient was given instructions to immediately contact us, without hesitation. The neurostimulator representative and I, both provided the patient with our Business cards containing our contact telephone numbers, and instructed the patient to contact either one of [place], at any time, should there be any problems or questions. In any case, we plan to contact the patient by telephone for a follow-up status report regarding this interventional procedure.  EBL: None.    Complications: No heme; no paresthesias.  Disposition: Return to clinics in 5-7 days for removal of trial electrodes and evaluation of trial.  Additional Comments/Plan: None.  Equipment used:   1.  Medtronic model number [id number], lot number [id number].    2.  Medtronic lead kit [id number], lot number [id number].  3.  Medtronic accessory kit model [id number], lot number [id number].    4.  Medtronic accessory kit model [id number], lot number [id number].  5.  Medtronic accessory kit model [id number], lot number [id number].  6.  Medtronic Injex bumpy anchor model number [id number], lot number [id number].       Disclaimer: Medicine is not an Chief Strategy Officer. The only guarantee in medicine is that nothing is guaranteed. It is important to note that the decision to proceed with this intervention was based on the information collected from the patient. The Data and conclusions were drawn from the patients questionnaire, the interview, and the physical examination. Because the information was provided in large part by the patient, it cannot be guaranteed that it has not been purposely or unconsciously manipulated. Every effort has been made to obtain as much relevant data as possible for this evaluation. It is important to note that the conclusions that lead to this procedure are derived in large part from the available data. Always take into account that the treatment will also be dependent on availability of resources and existing treatment guidelines, considered by other Pain Management Practitioners as being common knowledge and practice, at this time. For Medico-Legal purposes, it is also important to point out that variations in procedural techniques and pharmacological choices are the acceptable norm. The indications, contraindications, technique, and results of the above procedure should only be interpreted and judged by a Board-Certified Interventional Pain Specialist with extensive familiarity and expertise in the same exact procedure and technique, doing otherwise would be inappropriate and unethical.   ____________________________ Kathlen Brunswick. Dossie Arbour, MD fan:bu D: 05/05/2014 15:20:17 ET T: 05/05/2014 17:09:23 ET JOB#: [id number]  cc: Magnolia Mattila A. Dossie Arbour, MD, <Dictator>

## 2014-06-07 ENCOUNTER — Other Ambulatory Visit: Payer: Self-pay | Admitting: Orthopedic Surgery

## 2014-06-07 DIAGNOSIS — Z96659 Presence of unspecified artificial knee joint: Secondary | ICD-10-CM

## 2014-06-07 DIAGNOSIS — T84098S Other mechanical complication of other internal joint prosthesis, sequela: Secondary | ICD-10-CM

## 2014-06-10 ENCOUNTER — Encounter
Admission: RE | Admit: 2014-06-10 | Discharge: 2014-06-10 | Disposition: A | Discharge status: Home / Self Care | Payer: Medicare Other | Source: Ambulatory Visit | Attending: Orthopedic Surgery | Admitting: Orthopedic Surgery

## 2014-06-10 ENCOUNTER — Ambulatory Visit
Admission: RE | Admit: 2014-06-10 | Discharge: 2014-06-10 | Disposition: A | Discharge status: Home / Self Care | Payer: Medicare Other | Source: Ambulatory Visit | Attending: Orthopedic Surgery | Admitting: Orthopedic Surgery

## 2014-06-10 ENCOUNTER — Other Ambulatory Visit: Payer: Self-pay | Admitting: Orthopedic Surgery

## 2014-06-10 DIAGNOSIS — T84098S Other mechanical complication of other internal joint prosthesis, sequela: Secondary | ICD-10-CM | POA: Insufficient documentation

## 2014-06-10 DIAGNOSIS — T84098D Other mechanical complication of other internal joint prosthesis, subsequent encounter: Secondary | ICD-10-CM

## 2014-06-10 DIAGNOSIS — Z96659 Presence of unspecified artificial knee joint: Secondary | ICD-10-CM

## 2014-06-10 DIAGNOSIS — Z96649 Presence of unspecified artificial hip joint: Secondary | ICD-10-CM

## 2014-06-10 DIAGNOSIS — X58XXXS Exposure to other specified factors, sequela: Secondary | ICD-10-CM | POA: Insufficient documentation

## 2014-06-10 HISTORY — DX: Malignant (primary) neoplasm, unspecified: C80.1

## 2014-06-10 IMAGING — CT NM BONE W/ SPECT
1 series · 1 of 14 positions shown · non-contrast
Comparison: No asymmetric or increased blood flow to the left right
hip.

CLINICAL DATA: Bilateral hip pain. Bilateral hip arthroplasty.
Additional history of prostate cancer.

EXAM:
NM BONE SCAN AND SPECT IMAGING
TECHNIQUE: After intravenous injection of radiopharmaceutical, immediate and
delayed planar images were obtained in multiple projections.
Additionally, delayed triplanar SPECT images were obtained through
the area of interest.
RADIOPHARMACEUTICALS:  23.3 mCi 1echnetium-UUm MDP IV

[Series 1001: axial bone fused · 1 of 76 slices shown]
[im 41/76]
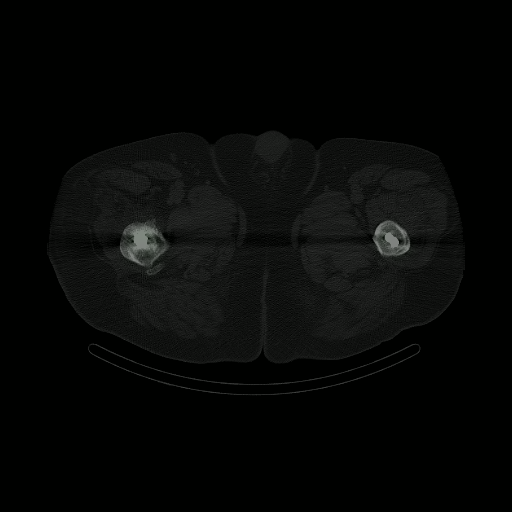

[1 of 14 positions shown; findings below may reference images not displayed]

FINDINGS: Vascular phase: No asymmetric or increased vascular flow to the left
right hip.

Blood pool phase: No asymmetric or increased blood pool activity at
the hip joints.

Delayed phase: There is periprosthetic uptake within the right
proximal femur at the level of the greater and lesser trochanter.

There is delayed uptake within the left femur at the level of the
prosthetic stem as well as the greater trochanter region. There is
heterotopic calcification in the greater trochanter region.
IMPRESSION: 1. No increased vascularity or abnormal blood pool activity to
suggest loosening or infection.
2. Uptake at the prosthetic stem of the left femoral prosthetic
(medial aspect) suggests stress reaction.
3. Uptake in the bilateral greater trochanter regional is a
combination of bone remodeling and heterotopic ossification.

## 2014-06-10 MED ORDER — TECHNETIUM TC 99M MEDRONATE IV KIT
23.2740 | PACK | Freq: Once | INTRAVENOUS | Status: AC | PRN
  Administered 2014-06-10: 23.274 via INTRAVENOUS

## 2014-06-18 ENCOUNTER — Encounter: Payer: Self-pay | Admitting: *Deleted

## 2014-06-18 ENCOUNTER — Other Ambulatory Visit: Payer: Self-pay | Admitting: *Deleted

## 2014-06-18 DIAGNOSIS — R339 Retention of urine, unspecified: Secondary | ICD-10-CM | POA: Insufficient documentation

## 2014-06-18 DIAGNOSIS — N419 Inflammatory disease of prostate, unspecified: Secondary | ICD-10-CM | POA: Insufficient documentation

## 2014-06-18 DIAGNOSIS — N529 Male erectile dysfunction, unspecified: Secondary | ICD-10-CM

## 2014-06-18 DIAGNOSIS — E785 Hyperlipidemia, unspecified: Secondary | ICD-10-CM | POA: Insufficient documentation

## 2014-06-18 DIAGNOSIS — C61 Malignant neoplasm of prostate: Secondary | ICD-10-CM | POA: Insufficient documentation

## 2014-06-18 DIAGNOSIS — E669 Obesity, unspecified: Secondary | ICD-10-CM

## 2014-06-18 DIAGNOSIS — I10 Essential (primary) hypertension: Secondary | ICD-10-CM | POA: Insufficient documentation

## 2014-06-18 DIAGNOSIS — F419 Anxiety disorder, unspecified: Secondary | ICD-10-CM | POA: Insufficient documentation

## 2014-06-18 DIAGNOSIS — I519 Heart disease, unspecified: Secondary | ICD-10-CM | POA: Insufficient documentation

## 2014-07-05 ENCOUNTER — Other Ambulatory Visit: Payer: Self-pay | Admitting: Anesthesiology

## 2014-07-07 ENCOUNTER — Encounter: Payer: Self-pay | Admitting: *Deleted

## 2014-07-07 MED ORDER — DEXTROSE 5 % IV SOLN
3.0000 g | INTRAVENOUS | Status: AC
  Administered 2014-07-08: 3 g via INTRAVENOUS
  Filled 2014-07-07: qty 3000

## 2014-07-07 NOTE — H&P (Signed)
Richard Duncan is an 64 y.o. male.   Chief Complaint: back pain HPI: 64 year old right-handed gentleman, referred to me by Dr. Dossie Arbour, for consideration of permanent SCS implantation.  This gentleman has a extensive surgical history including multiple back surgeries at L4 L5 particularly, and hip replacements.  He continues to have widespread chronic pain, and symptoms of low back pain with radiation into the lower extremity center consistent with a diagnosis of lumbar postlaminectomy syndrome.  He's undergone multiple interventional procedures, and trials of various medication management without good success, or side effects that were intolerable.  He recently underwent a trial of spinal cord stimulator therapy, Medtronic, with Dr. Dossie Arbour.  He reports today a 50-60% improvement in his low back pain, as well as some of his hip pain.  He was extremely pleased with his trial.   Past Medical History  Diagnosis Date  . Cancer 2011    Prostate Ca  . ED (erectile dysfunction)   . Hypertension   . Obesity   . Prostatitis   . Over weight   . Urinary retention     Past Surgical History  Procedure Laterality Date  . Back surgery  1988, 2006    Lumbar Spine  . Joint replacement  2004, 2005, 2014, 2015    Bilat Hip Replacements  . Cardiac pacemaker placement    . Appendectomy    . Cervical fusion    . Tonsillectomy    . Prostate surgery      prostatectomy  . Back surgery      x2    Family History  Problem Relation Age of Onset  . Benign prostatic hyperplasia Father    Social History:  reports that he quit smoking about 40 years ago. His smoking use included Cigarettes. He does not have any smokeless tobacco history on file. He reports that he drinks alcohol. His drug history is not on file.  Allergies:  Allergies  Allergen Reactions  . Propofol Other (See Comments)    URINARY RETENTION REQUIRING CATHETERIZATION  . Propranolol Other (See Comments)    Urinary retention     Medications Prior to Admission  Medication Sig Dispense Refill  . amLODipine (NORVASC) 10 MG tablet Take 10 mg by mouth daily.     Marland Kitchen aspirin EC 81 MG tablet Take 81 mg by mouth daily.     Marland Kitchen atorvastatin (LIPITOR) 20 MG tablet Take 20 mg by mouth daily at 6 PM.     . benazepril (LOTENSIN) 20 MG tablet Take 20 mg by mouth daily.     . Cholecalciferol (VITAMIN D3) 2000 UNITS capsule Take 2,000 Units by mouth daily.     . citalopram (CELEXA) 40 MG tablet Take 40 mg by mouth daily.     . diazepam (VALIUM) 10 MG tablet Take 10 mg by mouth 2 (two) times daily. Take 2 tablets by mouth once daily    . nabumetone (RELAFEN) 750 MG tablet Take 750 mg by mouth 2 (two) times daily.    . sildenafil (VIAGRA) 100 MG tablet Take 100 mg by mouth daily as needed for erectile dysfunction.    . tadalafil (CIALIS) 10 MG tablet Take 10 mg by mouth daily as needed for erectile dysfunction.      Results for orders placed or performed during the hospital encounter of 07/08/14 (from the past 48 hour(s))  Basic metabolic panel     Status: Abnormal   Collection Time: 07/08/14  9:41 AM  Result Value Ref Range   Sodium  139 135 - 145 mmol/L   Potassium 4.2 3.5 - 5.1 mmol/L   Chloride 108 101 - 111 mmol/L   CO2 24 22 - 32 mmol/L   Glucose, Bld 96 65 - 99 mg/dL   BUN 19 6 - 20 mg/dL   Creatinine, Ser 0.93 0.61 - 1.24 mg/dL   Calcium 8.3 (L) 8.9 - 10.3 mg/dL   GFR calc non Af Amer >60 >60 mL/min   GFR calc Af Amer >60 >60 mL/min    Comment: (NOTE) The eGFR has been calculated using the CKD EPI equation. This calculation has not been validated in all clinical situations. eGFR's persistently <60 mL/min signify possible Chronic Kidney Disease.    Anion gap 7 5 - 15  CBC     Status: None   Collection Time: 07/08/14  9:41 AM  Result Value Ref Range   WBC 9.5 4.0 - 10.5 K/uL   RBC 4.70 4.22 - 5.81 MIL/uL   Hemoglobin 14.8 13.0 - 17.0 g/dL   HCT 42.3 39.0 - 52.0 %   MCV 90.0 78.0 - 100.0 fL   MCH 31.5 26.0 -  34.0 pg   MCHC 35.0 30.0 - 36.0 g/dL   RDW 13.3 11.5 - 15.5 %   Platelets 175 150 - 400 K/uL   No results found.  Review of Systems  Constitutional: Negative.   HENT: Negative.   Eyes: Negative.   Respiratory: Negative.   Cardiovascular: Negative.   Gastrointestinal: Negative.   Genitourinary: Negative.   Musculoskeletal: Positive for back pain. Negative for myalgias, falls and neck pain.  Skin: Negative.   Neurological: Negative.   Endo/Heme/Allergies: Negative.   Psychiatric/Behavioral: Negative.     Blood pressure 157/82, pulse 62, temperature 98.5 F (36.9 C), resp. rate 20, height _0  (1.854 m), weight 158.759 kg (350 lb), SpO2 98 %. Physical Exam  Constitutional: He is oriented to person, place, and time. He appears well-developed and well-nourished.  HENT:  Head: Normocephalic and atraumatic.  Eyes: EOM are normal. Pupils are equal, round, and reactive to light.  Neck: Normal range of motion.  Cardiovascular: Normal rate and regular rhythm.   Respiratory: Effort normal.  Musculoskeletal: Normal range of motion.  Neurological: He is alert and oriented to person, place, and time.  Skin: Skin is warm and dry.  Psychiatric: He has a normal mood and affect. His behavior is normal. Judgment and thought content normal.     Assessment/Plan ASSESSMENT: Chronic pain Lumbar postlaminectomy syndrome Low back pain Lumbosacral radiculopathy  PLAN: Permanent SCS implantation, Medtronic system  Bonna Gains 07/08/2014, 10:28 AM

## 2014-07-07 NOTE — Progress Notes (Signed)
Richard Duncan has a Medtronic Pacemaker, inserted for "slow heart rate." I faxed  Peri op orders to Dr Humphrey Rolls @ [phone].  I called the 800 # for Medtronic and informed them of procedure and time.   I notified CC charge RN in OR of patient and that I have not received a call back from Medtronic rep.

## 2014-07-08 ENCOUNTER — Encounter (HOSPITAL_COMMUNITY): Payer: Self-pay | Admitting: Certified Registered Nurse Anesthetist

## 2014-07-08 ENCOUNTER — Ambulatory Visit (HOSPITAL_COMMUNITY): Payer: Medicare Other | Admitting: Anesthesiology

## 2014-07-08 ENCOUNTER — Encounter (HOSPITAL_COMMUNITY)
Admission: RE | Disposition: A | Discharge status: Home / Self Care | Payer: Self-pay | Source: Ambulatory Visit | Attending: Anesthesiology

## 2014-07-08 ENCOUNTER — Ambulatory Visit (HOSPITAL_COMMUNITY): Payer: Medicare Other

## 2014-07-08 ENCOUNTER — Ambulatory Visit (HOSPITAL_COMMUNITY)
Admission: RE | Admit: 2014-07-08 | Discharge: 2014-07-08 | Disposition: A | Discharge status: Home / Self Care | Payer: Medicare Other | Source: Ambulatory Visit | Attending: Anesthesiology | Admitting: Anesthesiology

## 2014-07-08 DIAGNOSIS — N529 Male erectile dysfunction, unspecified: Secondary | ICD-10-CM | POA: Diagnosis not present

## 2014-07-08 DIAGNOSIS — Z87891 Personal history of nicotine dependence: Secondary | ICD-10-CM | POA: Diagnosis not present

## 2014-07-08 DIAGNOSIS — Z6841 Body Mass Index (BMI) 40.0 and over, adult: Secondary | ICD-10-CM | POA: Insufficient documentation

## 2014-07-08 DIAGNOSIS — E669 Obesity, unspecified: Secondary | ICD-10-CM | POA: Diagnosis not present

## 2014-07-08 DIAGNOSIS — Z8546 Personal history of malignant neoplasm of prostate: Secondary | ICD-10-CM | POA: Insufficient documentation

## 2014-07-08 DIAGNOSIS — I10 Essential (primary) hypertension: Secondary | ICD-10-CM | POA: Diagnosis not present

## 2014-07-08 DIAGNOSIS — Z888 Allergy status to other drugs, medicaments and biological substances status: Secondary | ICD-10-CM | POA: Diagnosis not present

## 2014-07-08 DIAGNOSIS — Z950 Presence of cardiac pacemaker: Secondary | ICD-10-CM | POA: Diagnosis not present

## 2014-07-08 DIAGNOSIS — M5416 Radiculopathy, lumbar region: Secondary | ICD-10-CM | POA: Diagnosis not present

## 2014-07-08 DIAGNOSIS — M961 Postlaminectomy syndrome, not elsewhere classified: Secondary | ICD-10-CM | POA: Diagnosis not present

## 2014-07-08 DIAGNOSIS — G8929 Other chronic pain: Secondary | ICD-10-CM | POA: Diagnosis not present

## 2014-07-08 DIAGNOSIS — R339 Retention of urine, unspecified: Secondary | ICD-10-CM | POA: Insufficient documentation

## 2014-07-08 DIAGNOSIS — Z419 Encounter for procedure for purposes other than remedying health state, unspecified: Secondary | ICD-10-CM

## 2014-07-08 HISTORY — DX: Personal history of other diseases of the digestive system: Z87.19

## 2014-07-08 HISTORY — PX: SPINAL CORD STIMULATOR INSERTION: SHX5378

## 2014-07-08 HISTORY — DX: Presence of cardiac pacemaker: Z95.0

## 2014-07-08 HISTORY — DX: Hyperlipidemia, unspecified: E78.5

## 2014-07-08 HISTORY — DX: Anxiety disorder, unspecified: F41.9

## 2014-07-08 HISTORY — DX: Personal history of other medical treatment: Z92.89

## 2014-07-08 HISTORY — DX: Personal history of urinary calculi: Z87.442

## 2014-07-08 HISTORY — DX: Other complications of anesthesia, initial encounter: T88.59XA

## 2014-07-08 HISTORY — DX: Adverse effect of unspecified anesthetic, initial encounter: T41.45XA

## 2014-07-08 HISTORY — DX: Post-traumatic stress disorder, unspecified: F43.10

## 2014-07-08 LAB — BASIC METABOLIC PANEL
Anion gap: 7 (ref 5–15)
BUN: 19 mg/dL (ref 6–20)
CO2: 24 mmol/L (ref 22–32)
Calcium: 8.3 mg/dL — ABNORMAL LOW (ref 8.9–10.3)
Chloride: 108 mmol/L (ref 101–111)
Creatinine, Ser: 0.93 mg/dL (ref 0.61–1.24)
GFR calc Af Amer: >60 mL/min (ref >60)
GFR calc non Af Amer: >60 mL/min (ref >60)
Glucose, Bld: 96 mg/dL (ref 65–99)
Potassium: 4.2 mmol/L (ref 3.5–5.1)
Sodium: 139 mmol/L (ref 135–145)

## 2014-07-08 LAB — CBC
HCT: 42.3 % (ref 39.0–52.0)
Hemoglobin: 14.8 g/dL (ref 13.0–17.0)
MCH: 31.5 pg (ref 26.0–34.0)
MCHC: 35 g/dL (ref 30.0–36.0)
MCV: 90 fL (ref 78.0–100.0)
Platelets: 175 10*3/uL (ref 150–400)
RBC: 4.7 MIL/uL (ref 4.22–5.81)
RDW: 13.3 % (ref 11.5–15.5)
WBC: 9.5 10*3/uL (ref 4.0–10.5)

## 2014-07-08 LAB — SURGICAL PCR SCREEN
MRSA, PCR: NEGATIVE
Staphylococcus aureus: NEGATIVE

## 2014-07-08 SURGERY — INSERTION, SPINAL CORD STIMULATOR, LUMBAR
Anesthesia: Monitor Anesthesia Care | Site: Back

## 2014-07-08 MED ORDER — LACTATED RINGERS IV SOLN
INTRAVENOUS | Status: DC
  Administered 2014-07-08 (×2): via INTRAVENOUS

## 2014-07-08 MED ORDER — MUPIROCIN 2 % EX OINT
1.0000 "application " | TOPICAL_OINTMENT | Freq: Once | CUTANEOUS | Status: AC
  Administered 2014-07-08: 1 via TOPICAL

## 2014-07-08 MED ORDER — SODIUM CHLORIDE 0.9 % IR SOLN
Status: DC | PRN
  Administered 2014-07-08: 500 mL

## 2014-07-08 MED ORDER — ONDANSETRON HCL 4 MG/2ML IJ SOLN
INTRAMUSCULAR | Status: AC
  Filled 2014-07-08: qty 2

## 2014-07-08 MED ORDER — FENTANYL CITRATE (PF) 250 MCG/5ML IJ SOLN
INTRAMUSCULAR | Status: AC
  Filled 2014-07-08: qty 5

## 2014-07-08 MED ORDER — HYDROCODONE-ACETAMINOPHEN 10-325 MG PO TABS: 1.0000 | ORAL_TABLET | Freq: Four times a day (QID) | ORAL | Status: DC | PRN

## 2014-07-08 MED ORDER — PROPOFOL 10 MG/ML IV BOLUS
INTRAVENOUS | Status: AC
  Filled 2014-07-08: qty 20

## 2014-07-08 MED ORDER — DEXMEDETOMIDINE HCL IN NACL 400 MCG/100ML IV SOLN
INTRAVENOUS | Status: DC | PRN
  Administered 2014-07-08: .5 ug/kg/h via INTRAVENOUS
  Administered 2014-07-08: 13:00:00 via INTRAVENOUS

## 2014-07-08 MED ORDER — BUPIVACAINE-EPINEPHRINE (PF) 0.5% -1:200000 IJ SOLN
INTRAMUSCULAR | Status: AC
  Filled 2014-07-08: qty 90

## 2014-07-08 MED ORDER — BACITRACIN-NEOMYCIN-POLYMYXIN OINTMENT TUBE
TOPICAL_OINTMENT | CUTANEOUS | Status: DC | PRN
  Administered 2014-07-08: 1 via TOPICAL

## 2014-07-08 MED ORDER — DEXMEDETOMIDINE HCL IN NACL 200 MCG/50ML IV SOLN
INTRAVENOUS | Status: AC
  Filled 2014-07-08: qty 50

## 2014-07-08 MED ORDER — LIDOCAINE HCL (CARDIAC) 20 MG/ML IV SOLN
INTRAVENOUS | Status: AC
  Filled 2014-07-08: qty 5

## 2014-07-08 MED ORDER — MUPIROCIN 2 % EX OINT
TOPICAL_OINTMENT | CUTANEOUS | Status: AC
  Administered 2014-07-08: 1 via TOPICAL
  Filled 2014-07-08: qty 22

## 2014-07-08 MED ORDER — DEXMEDETOMIDINE HCL 200 MCG/2ML IV SOLN
INTRAVENOUS | Status: DC | PRN
  Administered 2014-07-08: 4 ug via INTRAVENOUS
  Administered 2014-07-08 (×2): 8 ug via INTRAVENOUS
  Administered 2014-07-08 (×2): 4 ug via INTRAVENOUS

## 2014-07-08 MED ORDER — MIDAZOLAM HCL 2 MG/2ML IJ SOLN
INTRAMUSCULAR | Status: AC
  Filled 2014-07-08: qty 2

## 2014-07-08 MED ORDER — FENTANYL CITRATE (PF) 100 MCG/2ML IJ SOLN
INTRAMUSCULAR | Status: DC | PRN
  Administered 2014-07-08 (×5): 50 ug via INTRAVENOUS
  Administered 2014-07-08: 100 ug via INTRAVENOUS
  Administered 2014-07-08: 50 ug via INTRAVENOUS
  Administered 2014-07-08: 100 ug via INTRAVENOUS

## 2014-07-08 MED ORDER — BACITRACIN-NEOMYCIN-POLYMYXIN 400-5-5000 EX OINT
TOPICAL_OINTMENT | CUTANEOUS | Status: AC
  Filled 2014-07-08: qty 1

## 2014-07-08 MED ORDER — MIDAZOLAM HCL 5 MG/5ML IJ SOLN
INTRAMUSCULAR | Status: DC | PRN
  Administered 2014-07-08 (×2): 2 mg via INTRAVENOUS

## 2014-07-08 MED ORDER — ONDANSETRON HCL 4 MG/2ML IJ SOLN
INTRAMUSCULAR | Status: DC | PRN
  Administered 2014-07-08: 4 mg via INTRAVENOUS

## 2014-07-08 MED ORDER — HYDROMORPHONE HCL 1 MG/ML IJ SOLN: 0.2500 mg | INTRAMUSCULAR | Status: DC | PRN

## 2014-07-08 MED ORDER — BUPIVACAINE-EPINEPHRINE (PF) 0.5% -1:200000 IJ SOLN
INTRAMUSCULAR | Status: DC | PRN
  Administered 2014-07-08: 36 mL via PERINEURAL

## 2014-07-08 SURGICAL SUPPLY — 62 items
ACCESSORY KIT, CURVED-TIP EPIDURAL NEDDLE (14GAUGE, 15CM) ×2 IMPLANT
BAG DECANTER FOR FLEXI CONT (MISCELLANEOUS) ×2 IMPLANT
BENZOIN TINCTURE PRP APPL 2/3 (GAUZE/BANDAGES/DRESSINGS) IMPLANT
BINDER ABDOMINAL 12 ML 46-62 (SOFTGOODS) ×2 IMPLANT
BLADE 10 SAFETY STRL DISP (BLADE) ×2 IMPLANT
BLADE SURG ROTATE 9660 (MISCELLANEOUS) IMPLANT
CHARGING SYSTEM ×2 IMPLANT
CHLORAPREP W/TINT 26ML (MISCELLANEOUS) ×2 IMPLANT
CONT SPEC 4OZ CLIKSEAL STRL BL (MISCELLANEOUS) ×2 IMPLANT
DERMABOND ADVANCED (GAUZE/BANDAGES/DRESSINGS) ×1
DERMABOND ADVANCED .7 DNX12 (GAUZE/BANDAGES/DRESSINGS) ×1 IMPLANT
DRAPE C-ARM 42X72 X-RAY (DRAPES) ×2 IMPLANT
DRAPE C-ARMOR (DRAPES) ×2 IMPLANT
DRAPE LAPAROTOMY 100X72X124 (DRAPES) ×2 IMPLANT
DRAPE POUCH INSTRU U-SHP 10X18 (DRAPES) ×2 IMPLANT
DRAPE SURG 17X23 STRL (DRAPES) ×2 IMPLANT
DRESSING TELFA 8X3 (GAUZE/BANDAGES/DRESSINGS) IMPLANT
DRSG OPSITE POSTOP 3X4 (GAUZE/BANDAGES/DRESSINGS) ×2 IMPLANT
DRSG OPSITE POSTOP 4X6 (GAUZE/BANDAGES/DRESSINGS) ×2 IMPLANT
ELECT REM PT RETURN 9FT ADLT (ELECTROSURGICAL) ×2
ELECTRODE REM PT RTRN 9FT ADLT (ELECTROSURGICAL) ×1 IMPLANT
GAUZE SPONGE 4X4 16PLY XRAY LF (GAUZE/BANDAGES/DRESSINGS) ×2 IMPLANT
GLOVE BIOGEL PI IND STRL 7.5 (GLOVE) ×1 IMPLANT
GLOVE BIOGEL PI INDICATOR 7.5 (GLOVE) ×1
GLOVE ECLIPSE 7.5 STRL STRAW (GLOVE) ×2 IMPLANT
GLOVE EXAM NITRILE LRG STRL (GLOVE) IMPLANT
GLOVE EXAM NITRILE MD LF STRL (GLOVE) IMPLANT
GLOVE EXAM NITRILE XL STR (GLOVE) IMPLANT
GLOVE EXAM NITRILE XS STR PU (GLOVE) IMPLANT
GOWN STRL REUS W/ TWL LRG LVL3 (GOWN DISPOSABLE) IMPLANT
GOWN STRL REUS W/ TWL XL LVL3 (GOWN DISPOSABLE) IMPLANT
GOWN STRL REUS W/TWL 2XL LVL3 (GOWN DISPOSABLE) IMPLANT
GOWN STRL REUS W/TWL LRG LVL3 (GOWN DISPOSABLE)
GOWN STRL REUS W/TWL XL LVL3 (GOWN DISPOSABLE)
KIT ACCESSORY (KITS) ×4 IMPLANT
KIT BASIN OR (CUSTOM PROCEDURE TRAY) ×2 IMPLANT
KIT LEAD (Lead) ×4 IMPLANT
KIT ROOM TURNOVER OR (KITS) ×2 IMPLANT
NEEDLE 18GX1X1/2 (RX/OR ONLY) (NEEDLE) IMPLANT
NEEDLE HYPO 25X1 1.5 SAFETY (NEEDLE) ×2 IMPLANT
NS IRRIG 1000ML POUR BTL (IV SOLUTION) ×2 IMPLANT
PACK LAMINECTOMY NEURO (CUSTOM PROCEDURE TRAY) ×2 IMPLANT
PAD ARMBOARD 7.5X6 YLW CONV (MISCELLANEOUS) ×2 IMPLANT
POUCH TYRX NEURO LRG (Mesh General) ×2 IMPLANT
PROGRAMMER PATIENT (MISCELLANEOUS) ×2 IMPLANT
SPONGE LAP 4X18 X RAY DECT (DISPOSABLE) ×2 IMPLANT
SPONGE SURGIFOAM ABS GEL SZ50 (HEMOSTASIS) IMPLANT
STAPLER SKIN PROX WIDE 3.9 (STAPLE) ×2 IMPLANT
STIMULATOR SURESCAN SPINAL (Neurostimulator) ×2 IMPLANT
STRIP CLOSURE SKIN 1/2X4 (GAUZE/BANDAGES/DRESSINGS) IMPLANT
SUT MNCRL AB 4-0 PS2 18 (SUTURE) IMPLANT
SUT SILK 0 (SUTURE) ×1
SUT SILK 0 MO-6 18XCR BRD 8 (SUTURE) ×1 IMPLANT
SUT SILK 0 TIES 10X30 (SUTURE) IMPLANT
SUT SILK 2 0 TIES 10X30 (SUTURE) IMPLANT
SUT VIC AB 2-0 CP2 18 (SUTURE) ×4 IMPLANT
SYRINGE 10CC LL (SYRINGE) IMPLANT
SYSTEM RECHARG SURESCAN SPINE (Neurostimulator) ×2 IMPLANT
TOWEL OR 17X24 6PK STRL BLUE (TOWEL DISPOSABLE) ×2 IMPLANT
TOWEL OR 17X26 10 PK STRL BLUE (TOWEL DISPOSABLE) ×2 IMPLANT
WATER STERILE IRR 1000ML POUR (IV SOLUTION) ×2 IMPLANT
YANKAUER SUCT BULB TIP NO VENT (SUCTIONS) ×2 IMPLANT

## 2014-07-08 NOTE — Op Note (Signed)
PREOP DX: 1) lumbago  2) lumbar radiculopathy  3) lumbar post-laminectomy syndrome  4) chronic pain  POSTOP DX: 1) lumbago  2) lumbar radiculopathy  3) lumbar post-laminectomy syndrome  4) chronic pain PROCEDURES PERFORMED:1) intraop fluoro 2) placement of 2 Medtoronic 8 contact leads serial numbers [id number] 3) placement of Medtronic SCS generator serial number [id number] SURGEON:Talicia Sui  ASSISTANT: NONE  ANESTHESIA: MAC  EBL: <20cc  DESCRIPTION OF PROCEDURE: After a discussion of risks, benefits and alternatives, informed consent was obtained. The patient was taken to the OR, turned prone onto a Jackson table, all pressure points padded, SCD's placed, and an adequate plane of anesthesia induced. A timeout was taken to verify the correct patient, position, personnel, availability of appropriate equipment, and administration of perioperative antibiotics.  The thoracic and lumbar areas were widely prepped with chloraprep and draped into a sterile field. Fluoroscopy was used to plan a right paramedian incision at the T12-L2 levels, and an incision made with a 10 blade and carried down to the dorsolumbar fascia with the bovie and blunt dissection. Retractors were placed and a 14g tuohy needle placed into the epidural space at the T12-L1 interspace using biplanar fluoro and loss-of-resistance technique. The needle was aspirated without any return of fluid. A 8-contact lead was introduced and under live AP fluoro advanced until the distal-most contact overlay the superior aspect T8 vertebral body shadow with the rest of the contacts distributed over the T8 and T9 vertebral bodies in a position just right of anatomic midline. A second lead was placed just left of anatomic midline in the same levels using the same technique. The patient was awakened and the leads  tested; impedances were good, and the patient reported good coverage with amplitudes in the 3-7 mA range. 0 silk sutures were placed in the fascia adjacent to the needles. The needles and stylets were removed under fluoroscopy with no lead migration noted. Leads were then fixed to the fascia with silicone anchors, and the anchors fixed into position with 0-silk interrupted sutures; repeat images were obtained to verify that there had been no lead migration.  The incision was inspected and hemostasis obtained with the bipolar cautery.  Attention was then turned to creation of a subcutaneous pocket. At the right flank, a 3 cm incision was made with a 10 blade and using the bovie and blunt dissection a pocket of size appropriate to place a SCS generator. The pocket was trialed, and found to be of adequate size. The pocket was inspected for hemostasis, which was found to be excellent. Using reverse seldinger technique, the leads were tunneled to the pocket site, and the leads inserted into the SCS generator. Impedances were checked, and all found to be excellent. The leads were then all fixed into position with a self-torquing wrench. The wiring was all carefully coiled, placed behind the generator and placed in the pocket, with a Medtronic antibiotic eluting mesh bag surrounding the generator.  Both incisions were copiously irrigated with bacitracin-containing irrigation. The lumbar incision was closed in 2 deep layers of interrupted 2-0 vicryl and the skin closed with staples. The pocket incision was closed with a deeper layer of 2-0 vicryl interrupted sutures, and the skin closed with staples. Sterile dressings were applied.   Needle, sponge, and instrument counts were correct x2 at the end of the case.  The patient was then carefully awakened from anesthesia, turned supine, an abdominal binder placed, and the patient taken to the recovery room where he underwent complex spinal  cord stimulator programming.    IPG and pacemaker were monitored throughout the case. No interference was noted, nor was any arryhthmia. Pacemaker settings were returned to preoperative values by the EP staff.   COMPLICATIONS: NONE  CONDITION: Stable throughout the course of the procedure and immediately afterward  DISPOSITION: discharge to home, with antibiotics and pain medicine. Discussed care with the patient and spouse. Followup in clinic will be scheduled in 10-14 days.

## 2014-07-08 NOTE — Progress Notes (Signed)
Orthopedic Tech Progress Note Patient Details:  Richard Duncan [facility] [birth year] [id number]  Ortho Devices Type of Ortho Device: Abdominal binder Ortho Device/Splint Interventions: Ordered   Asia Mellody Memos 07/08/2014, 2:42 PM

## 2014-07-08 NOTE — Anesthesia Preprocedure Evaluation (Addendum)
Anesthesia Evaluation  Patient identified by MRN, date of birth, ID band Patient awake    Reviewed: Allergy & Precautions, H&P , NPO status , Patient's Chart, lab work & pertinent test results  Airway Mallampati: III  TM Distance: >3 FB Neck ROM: Full    Dental no notable dental hx. (+) Teeth Intact, Dental Advisory Given   Pulmonary neg pulmonary ROS, former smoker,  breath sounds clear to auscultation  Pulmonary exam normal       Cardiovascular hypertension, Pt. on medications + pacemaker Rhythm:Regular Rate:Normal     Neuro/Psych Anxiety negative neurological ROS     GI/Hepatic negative GI ROS, Neg liver ROS,   Endo/Other  Morbid obesity  Renal/GU negative Renal ROS  negative genitourinary   Musculoskeletal   Abdominal   Peds  Hematology negative hematology ROS (+)   Anesthesia Other Findings   Reproductive/Obstetrics negative OB ROS                            Anesthesia Physical Anesthesia Plan  ASA: III  Anesthesia Plan: MAC   Post-op Pain Management:    Induction: Intravenous  Airway Management Planned: Simple Face Mask  Additional Equipment:   Intra-op Plan:   Post-operative Plan:   Informed Consent: I have reviewed the patients History and Physical, chart, labs and discussed the procedure including the risks, benefits and alternatives for the proposed anesthesia with the patient or authorized representative who has indicated his/her understanding and acceptance.   Dental advisory given  Plan Discussed with: CRNA  Anesthesia Plan Comments:         Anesthesia Quick Evaluation

## 2014-07-08 NOTE — Anesthesia Procedure Notes (Signed)
Procedure Name: MAC Date/Time: 07/08/2014 11:22 AM Performed by: Rogers Blocker Pre-anesthesia Checklist: Patient identified, Emergency Drugs available, Suction available, Patient being monitored and Timeout performed Oxygen Delivery Method: Simple face mask Placement Confirmation: positive ETCO2

## 2014-07-08 NOTE — Transfer of Care (Signed)
Immediate Anesthesia Transfer of Care Note  Patient: Richard Duncan  Procedure(s) Performed: Procedure(s) with comments: LUMBAR SPINAL CORD STIMULATOR INSERTION (N/A) - [name]  Patient Location: PACU  Anesthesia Type:MAC  Level of Consciousness: awake, alert , oriented and patient cooperative  Airway & Oxygen Therapy: Patient Spontanous Breathing  Post-op Assessment: Report given to RN, Post -op Vital signs reviewed and stable and Patient moving all extremities X 4  Post vital signs: Reviewed and stable  Last Vitals:  Filed Vitals:   07/08/14 0909  BP: 157/82  Pulse: 62  Temp: 36.9 C  Resp: 20    Complications: No apparent anesthesia complications

## 2014-07-08 NOTE — Anesthesia Postprocedure Evaluation (Signed)
  Anesthesia Post-op Note  Patient: Richard Duncan  Procedure(s) Performed: Procedure(s) with comments: Tarnov (N/A) - LUMBAR SPINAL CORD STIMULATOR INSERTION  Patient Location: PACU  Anesthesia Type: MAC   Level of Consciousness: awake, alert  and oriented  Airway and Oxygen Therapy: Patient Spontanous Breathing  Post-op Pain: mild  Post-op Assessment: Post-op Vital signs reviewed  Post-op Vital Signs: Reviewed  Last Vitals:  Filed Vitals:   07/08/14 1400  BP:   Pulse: 61  Temp:   Resp: 12    Complications: No apparent anesthesia complications

## 2014-07-10 ENCOUNTER — Other Ambulatory Visit: Payer: Self-pay | Admitting: Urology

## 2014-07-10 DIAGNOSIS — C61 Malignant neoplasm of prostate: Secondary | ICD-10-CM

## 2014-07-11 ENCOUNTER — Ambulatory Visit: Payer: Self-pay | Admitting: Urology

## 2014-07-12 ENCOUNTER — Encounter (HOSPITAL_COMMUNITY): Payer: Self-pay | Admitting: Anesthesiology

## 2014-07-18 ENCOUNTER — Encounter (HOSPITAL_COMMUNITY): Payer: Self-pay | Admitting: Anesthesiology

## 2014-07-20 ENCOUNTER — Encounter (HOSPITAL_COMMUNITY): Payer: Self-pay | Admitting: Anesthesiology

## 2014-09-13 ENCOUNTER — Encounter: Payer: Self-pay | Admitting: Urology

## 2014-09-13 ENCOUNTER — Ambulatory Visit (INDEPENDENT_AMBULATORY_CARE_PROVIDER_SITE_OTHER): Payer: Medicare Other | Admitting: Urology

## 2014-09-13 VITALS — BP 171/90 | HR 88 | Ht 73.0 in | Wt 360.4 lb

## 2014-09-13 DIAGNOSIS — C61 Malignant neoplasm of prostate: Secondary | ICD-10-CM

## 2014-09-13 DIAGNOSIS — N529 Male erectile dysfunction, unspecified: Secondary | ICD-10-CM | POA: Insufficient documentation

## 2014-09-13 DIAGNOSIS — N401 Enlarged prostate with lower urinary tract symptoms: Secondary | ICD-10-CM | POA: Diagnosis not present

## 2014-09-13 DIAGNOSIS — N528 Other male erectile dysfunction: Secondary | ICD-10-CM | POA: Diagnosis not present

## 2014-09-13 DIAGNOSIS — N138 Other obstructive and reflux uropathy: Secondary | ICD-10-CM

## 2014-09-13 DIAGNOSIS — Z8546 Personal history of malignant neoplasm of prostate: Secondary | ICD-10-CM

## 2014-09-13 DIAGNOSIS — R399 Unspecified symptoms and signs involving the genitourinary system: Secondary | ICD-10-CM | POA: Insufficient documentation

## 2014-09-13 LAB — BLADDER SCAN AMB NON-IMAGING: Scan Result: 65

## 2014-09-13 MED ORDER — TADALAFIL 5 MG PO TABS: 5.0000 mg | ORAL_TABLET | Freq: Every day | ORAL | Status: DC | PRN

## 2014-09-13 NOTE — Progress Notes (Signed)
09/13/2014 6:55 PM   Start Jan 09, 1951 [id number]  Referring provider: Sofie Hartigan, MD [facility], [place] [address]  Chief Complaint  Patient presents with  . Prostate Cancer    3 moth recheck  . Elevated PSA    HPI: Richard Duncan is a 64 year old white male with prostate cancer, BPH with LUTS and erectile dysfunction who presents today for a 3 month follow-up.  Prostate cancer Patient first underwent TRUSPBx of prostate in 2010 for a PSA of 4 and found to have a Gleason's 6. 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  His most recent PSA at the time of this visit was 2.1 ng/mL on 03/11/2014.  BPH WITH LUTS: His IPSS score today is 22, which is severe lower urinary tract symptomatology. He is mostly dissatisfied with his quality life due to his urinary symptoms. His PVR is 65 mL.    His major complaint today is the frequency of urination..  He has had these symptoms for several years.  He denies any dysuria, hematuria or suprapubic pain.   He also denies any recent fevers, chills, nausea or vomiting.  He has a family history of PCa, with his father being diagnosed with PCa.       IPSS      09/13/14 0900       International Prostate Symptom Score   How often have you had the sensation of not emptying your bladder? About half the time     How often have you had to urinate less than every two hours? Almost always     How often have you found you stopped and started again several times when you urinated? More than half the time     How often have you found it difficult to postpone urination? About half the time     How often have you had a weak urinary stream? More than half the time     How often have you had to strain to start urination? Less than half the time     How many times did you typically get up at night to urinate? 1 Time     Total  IPSS Score 22     Quality of Life due to urinary symptoms   If you were to spend the rest of your life with your urinary condition just the way it is now how would you feel about that? Mostly Disatisfied        Score:  1-7 Mild 8-19 Moderate 20-35 Severe  His SHIM score is 6, which is severe.   He has been having difficulty with erections for several years.   His major complaint is the inability to achieve an erection.  His libido is preserved.   His risk factors for ED are prostate cancer, BPH, hypertension and hyperlipidemia.  He denies any painful erections or curvatures with his erections.   He has tried PDE 5 inhibitors and vacuum erect aid device in the past.          SHIM      09/13/14 0928       SHIM: Over the last 6 months:   How do you rate your confidence that you could get and keep an erection? Very Low     When you had erections with sexual stimulation, how often were your erections hard enough for penetration (entering your  partner)? Almost Never or Never     During sexual intercourse, how often were you able to maintain your erection after you had penetrated (entered) your partner? Extremely Difficult     During sexual intercourse, how difficult was it to maintain your erection to completion of intercourse? Very Difficult     When you attempted sexual intercourse, how often was it satisfactory for you? Extremely Difficult     SHIM Total Score   SHIM 6        Score: 1-7 Severe ED 8-11 Moderate ED 12-16 Mild-Moderate ED 17-21 Mild ED 22-25 No ED      PMH: Past Medical History  Diagnosis Date  . Cancer 2011    Prostate Ca  . ED (erectile dysfunction)   . Hypertension   . Obesity   . Prostatitis   . Over weight   . Urinary retention   . History of hiatal hernia   . Hyperlipemia   . Anxiety   . PTSD (post-traumatic stress disorder)   . History of kidney stones   . History of blood product transfusion   . Complication of anesthesia     urinary  retention with Popanol  . Presence of permanent cardiac pacemaker     Surgical History: Past Surgical History  Procedure Laterality Date  . Back surgery  1988, 2006    Lumbar Spine  . Joint replacement  2004, 2005, 2014, 2015    Bilat Hip Replacements  . Cardiac pacemaker placement    . Appendectomy    . Cervical fusion    . Tonsillectomy    . Prostate surgery      prostatectomy  . Back surgery      x2 Laminectomy  . Colonoscopy    . Spinal cord stimulator insertion N/A 07/08/2014    Procedure: LUMBAR SPINAL CORD STIMULATOR INSERTION;  Surgeon: Clydell Hakim, MD;  Location: [facility];  Service: Neurosurgery;  Laterality: N/A;  LUMBAR SPINAL CORD STIMULATOR INSERTION    Home Medications:    Medication List       This list is accurate as of: 09/13/14 11:59 PM.  Always use your most recent med list.               amLODipine 10 MG tablet  Commonly known as:  NORVASC  Take 10 mg by mouth daily.     aspirin EC 81 MG tablet  Take 81 mg by mouth daily.     atorvastatin 20 MG tablet  Commonly known as:  LIPITOR  Take 20 mg by mouth daily at 6 PM.     benazepril 20 MG tablet  Commonly known as:  LOTENSIN  Take 20 mg by mouth daily.     citalopram 40 MG tablet  Commonly known as:  CELEXA  Take 40 mg by mouth daily.     diazepam 10 MG tablet  Commonly known as:  VALIUM  Take 10 mg by mouth 2 (two) times daily. Take 2 tablets by mouth once daily     HYDROcodone-acetaminophen 10-325 MG per tablet  Commonly known as:  NORCO  Take 1 tablet by mouth every 6 (six) hours as needed.     nabumetone 750 MG tablet  Commonly known as:  RELAFEN  Take 750 mg by mouth 2 (two) times daily.     sildenafil 100 MG tablet  Commonly known as:  VIAGRA  Take 100 mg by mouth daily as needed for erectile dysfunction.     tadalafil 5 MG tablet  Commonly known as:  CIALIS  Take 1 tablet (5 mg total) by mouth daily as needed for erectile dysfunction.     Vitamin D3 2000 UNITS capsule    Take 2,000 Units by mouth daily.        Allergies:  Allergies  Allergen Reactions  . Propofol Other (See Comments)    URINARY RETENTION REQUIRING CATHETERIZATION  . Propranolol Other (See Comments)    Urinary retention    Family History: Family History  Problem Relation Age of Onset  . Benign prostatic hyperplasia Father   . Kidney disease Neg Hx   . Prostate cancer Father     Social History:  reports that he quit smoking about 48 years ago. His smoking use included Cigarettes. He quit after 2 years of use. He does not have any smokeless tobacco history on file. He reports that he drinks alcohol. He reports that he does not use illicit drugs.  ROS: UROLOGY Frequent Urination?: Yes Hard to postpone urination?: Yes Burning/pain with urination?: No Get up at night to urinate?: Yes Leakage of urine?: Yes Urine stream starts and stops?: Yes Trouble starting stream?: Yes Do you have to strain to urinate?: No Blood in urine?: No Urinary tract infection?: Yes Sexually transmitted disease?: No Injury to kidneys or bladder?: No Painful intercourse?: No Weak stream?: No Erection problems?: Yes Penile pain?: No  Gastrointestinal Nausea?: No Vomiting?: No Indigestion/heartburn?: No Diarrhea?: No Constipation?: No  Constitutional Fever: No Night sweats?: No Weight loss?: No Fatigue?: No  Skin Skin rash/lesions?: No Itching?: No  Eyes Blurred vision?: No Double vision?: No  Ears/Nose/Throat Sore throat?: No Sinus problems?: No  Hematologic/Lymphatic Swollen glands?: No Easy bruising?: No  Cardiovascular Leg swelling?: No Chest pain?: No  Respiratory Cough?: No Shortness of breath?: Yes  Endocrine Excessive thirst?: No  Musculoskeletal Back pain?: Yes Joint pain?: Yes  Neurological Headaches?: Yes Dizziness?: No  Psychologic Depression?: Yes Anxiety?: Yes  Physical Exam: BP 171/90 mmHg  Pulse 88  Ht 6\' 1"  (1.854 m)  Wt 360 lb 6.4 oz  (163.476 kg)  BMI 47.56 kg/m2  GU: Patient with circumcised phallus.  Urethral meatus is patent.  No penile discharge. No penile lesions or rashes. Scrotum without lesions, cysts, rashes and/or edema.  Testicles are located scrotally bilaterally. No masses are appreciated in the testicles. Left and right epididymis are normal. Rectal: Patient with  normal sphincter tone. Perineum without scarring or rashes. No rectal masses are appreciated. Prostate is approximately 50 grams, no nodules are appreciated. Seminal vesicles are normal.  Laboratory Data: Lab Results  Component Value Date   WBC 9.5 07/08/2014   HGB 14.8 07/08/2014   HCT 42.3 07/08/2014   MCV 90.0 07/08/2014   PLT 175 07/08/2014    Lab Results  Component Value Date   CREATININE 0.93 07/08/2014    Lab Results  Component Value Date   PSA 2.2 09/13/2014   PSA 2.1 03/12/2014    Pertinent Imaging: BLADDER SCAN AMB NON-IMAGING  Result Value Ref Range   Scan Result 65     Assessment & Plan:   1. Prostate cancer:   Patient first underwent TRUSPBx of prostate in 2010 for a PSA of 4 and found to have a Gleason's 6. 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  PSA history:      3.0 ng/mL on 11/25/2011      3.2 ng/mL on 05/25/2012-biopsy  in May      3.4 ng/mL on 09/11/2012      1.7 ng/mL on 03/01/2013      1.4 ng/mL on 06/30/2013      2.4 ng/mL on 11/09/2013      2.1 ng/mL on 03/11/2014  Patient is currently on active surveillance. PSA drawn today. Pending PSA results, patient will follow up in four months for DRE and  PSA.  2. BPH with LUTS:    Patient's IPSS score is 22/4.  His PVR 65 mL.  His DRE demonstrates enlargement, no nodules.  Patient had been on tamsulosin in the past, but he did not find it effective. More recently, he had been on Cialis 5 mg daily. He found it effective for his symptoms, but he found that  cost prohibitive. He would like to restart the Cialis and I will prescribe it again and see if a prior authorization would help with the coverage of the medication. He will follow up in four months for a PVR and an IPSS.    3. Erectile dysfunction:   Patient had been on Viagra in the past and he would like to try that medication again. I have given him Viagra samples at the 100 mg dose #2. He will return in 4 months for an [facility] IM score.  Nursing note for RTC:      - IPSS score      - PVR      -SHIM score      -PSA (should have been drawn prior to appointment)      -EXAM     There are no diagnoses linked to this encounter.  Return in about 4 months (around 01/13/2015) for IPSS and SHIM and PVR.  Richard Duncan, [facility] [address], [place], [place] [address] [phone]

## 2014-09-14 ENCOUNTER — Telehealth: Payer: Self-pay

## 2014-09-14 LAB — PSA: Prostate Specific Ag, Serum: 2.2 ng/mL (ref 0.0–4.0)

## 2014-09-14 NOTE — Telephone Encounter (Signed)
-----   Message from Nori Riis, PA-C sent at 09/14/2014  8:07 AM EDT ----- PSA is stable. We'll see the patient in 6 months.

## 2014-09-14 NOTE — Telephone Encounter (Signed)
Spoke with pt in reference to PSA results. Pt voiced understanding.  

## 2014-11-18 ENCOUNTER — Other Ambulatory Visit: Payer: Self-pay | Admitting: Family Medicine

## 2014-11-18 DIAGNOSIS — R1010 Upper abdominal pain, unspecified: Secondary | ICD-10-CM

## 2014-11-24 ENCOUNTER — Ambulatory Visit
Admission: RE | Admit: 2014-11-24 | Discharge: 2014-11-24 | Disposition: A | Discharge status: Home / Self Care | Payer: Medicare Other | Source: Ambulatory Visit | Attending: Family Medicine | Admitting: Family Medicine

## 2014-11-24 DIAGNOSIS — N200 Calculus of kidney: Secondary | ICD-10-CM | POA: Diagnosis not present

## 2014-11-24 DIAGNOSIS — R1010 Upper abdominal pain, unspecified: Secondary | ICD-10-CM | POA: Diagnosis present

## 2014-11-24 MED ORDER — IOHEXOL 350 MG/ML SOLN
125.0000 mL | Freq: Once | INTRAVENOUS | Status: AC | PRN
  Administered 2014-11-24: 125 mL via INTRAVENOUS

## 2014-12-16 ENCOUNTER — Telehealth: Payer: Self-pay

## 2014-12-16 NOTE — Telephone Encounter (Signed)
Pt pharmacy sent a refill for cipro. Please advise.

## 2014-12-27 NOTE — Telephone Encounter (Signed)
Whenever the pharmacy requests a refill for an antibiotic, we need to contact the patient to see if they are the ones initiating the refill request.  If they are initiating the request, we need to know why.

## 2014-12-28 NOTE — Telephone Encounter (Signed)
Spoke with pt who stated yes he did request the medication to be refilled. Pt stated that he keeps cipro in the cabinet in case he gets a UTI. Pt denied any current s/s of UTI.

## 2015-01-06 ENCOUNTER — Other Ambulatory Visit: Payer: Medicare Other

## 2015-01-06 DIAGNOSIS — C61 Malignant neoplasm of prostate: Secondary | ICD-10-CM

## 2015-01-07 LAB — PSA: Prostate Specific Ag, Serum: 2 ng/mL (ref 0.0–4.0)

## 2015-01-07 LAB — PLEASE NOTE

## 2015-01-13 ENCOUNTER — Ambulatory Visit (INDEPENDENT_AMBULATORY_CARE_PROVIDER_SITE_OTHER): Payer: Medicare Other | Admitting: Urology

## 2015-01-13 ENCOUNTER — Encounter: Payer: Self-pay | Admitting: Urology

## 2015-01-13 VITALS — BP 171/90 | HR 80 | Ht 72.0 in | Wt 365.4 lb

## 2015-01-13 DIAGNOSIS — C61 Malignant neoplasm of prostate: Secondary | ICD-10-CM

## 2015-01-13 DIAGNOSIS — N401 Enlarged prostate with lower urinary tract symptoms: Secondary | ICD-10-CM

## 2015-01-13 DIAGNOSIS — N529 Male erectile dysfunction, unspecified: Secondary | ICD-10-CM

## 2015-01-13 DIAGNOSIS — N528 Other male erectile dysfunction: Secondary | ICD-10-CM

## 2015-01-13 DIAGNOSIS — N138 Other obstructive and reflux uropathy: Secondary | ICD-10-CM

## 2015-01-13 MED ORDER — SULFAMETHOXAZOLE-TRIMETHOPRIM 800-160 MG PO TABS: 1.0000 | ORAL_TABLET | Freq: Two times a day (BID) | ORAL | Status: DC

## 2015-01-13 NOTE — Progress Notes (Signed)
9:01 PM   Richard Duncan [facility] 04-07-1950 [id number]  Referring provider: Sofie Hartigan, MD [place], [place] [address]  Chief Complaint  Patient presents with  . Prostate Cancer    4 month recheck  . Erectile Dysfunction    HPI: [name] is a 64 year old white male with prostate cancer, BPH with LUTS and erectile dysfunction who presents today for a 4 month follow-up.  Prostate cancer Patient first underwent TRUSPBx of prostate in 2010 for a PSA of 4 and found to have a Gleason's 6. 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  His most recent PSA at the time of this visit was 2.2 ng/mL on 09/13/2014.  BPH WITH LUTS: His IPSS score today is 18, which is moderate lower urinary tract symptomatology. He is unhappy with his quality life due to his urinary symptoms. His major complaint today is the frequency of urination..  He has had these symptoms for several years.  He denies any dysuria, hematuria or suprapubic pain.  Patient found good symptom control on Cialis 5 mg daily, but he found the prescription cost prohibitive and had to discontinue the medication.  He did not find tamsulosin effective.  He also denies any recent fevers, chills, nausea or vomiting.  He has a family history of PCa, with his father being diagnosed with PCa.       IPSS      01/13/15 0900       International Prostate Symptom Score   How often have you had the sensation of not emptying your bladder? About half the time     How often have you had to urinate less than every two hours? Almost always     How often have you found you stopped and started again several times when you urinated? About half the time     How often have you found it difficult to postpone urination? About half the time     How often have you had a weak urinary stream? About half  the time     How often have you had to strain to start urination? Not at All     How many times did you typically get up at night to urinate? 1 Time     Total IPSS Score 18     Quality of Life due to urinary symptoms   If you were to spend the rest of your life with your urinary condition just the way it is now how would you feel about that? Unhappy        Score:  1-7 Mild 8-19 Moderate 20-35 Severe  Erectile dysfunction His SHIM score is 8, which is severe.   He has been having difficulty with erections for several years.   His major complaint is the inability to achieve an erection.  His libido is preserved.   His risk factors for ED are prostate cancer, BPH, hypertension and hyperlipidemia.  He denies any painful erections or curvatures with his erections.   He has tried PDE 5 inhibitors and vacuum erect aid device in the past.  He is not interested in a penile prosthesis.        SHIM      01/13/15 0936       SHIM: Over the last 6 months:   How do you rate your confidence that  you could get and keep an erection? Very Low     When you had erections with sexual stimulation, how often were your erections hard enough for penetration (entering your partner)? A Few Times (much less than half the time)     During sexual intercourse, how often were you able to maintain your erection after you had penetrated (entered) your partner? Very Difficult     During sexual intercourse, how difficult was it to maintain your erection to completion of intercourse? Very Difficult     When you attempted sexual intercourse, how often was it satisfactory for you? Extremely Difficult     SHIM Total Score   SHIM 8        Score: 1-7 Severe ED 8-11 Moderate ED 12-16 Mild-Moderate ED 17-21 Mild ED 22-25 No ED      PMH: Past Medical History  Diagnosis Date  . Cancer [facility]) 2011    Prostate Ca  . ED (erectile dysfunction)   . Hypertension   . Obesity   . Prostatitis   . Over weight   .  Urinary retention   . History of hiatal hernia   . Hyperlipemia   . Anxiety   . PTSD (post-traumatic stress disorder)   . History of kidney stones   . History of blood product transfusion   . Complication of anesthesia     urinary retention with Popanol  . Presence of permanent cardiac pacemaker     Surgical History: Past Surgical History  Procedure Laterality Date  . Back surgery  1988, 2006    Lumbar Spine  . Joint replacement  2004, 2005, 2014, 2015    Bilat Hip Replacements  . Cardiac pacemaker placement    . Appendectomy    . Cervical fusion    . Tonsillectomy    . Prostate surgery      prostatectomy  . Back surgery      x2 Laminectomy  . Colonoscopy    . Spinal cord stimulator insertion N/A 07/08/2014    Procedure: LUMBAR SPINAL CORD STIMULATOR INSERTION;  Surgeon: Clydell Hakim, MD;  Location: [place];  Service: Neurosurgery;  Laterality: N/A;  LUMBAR SPINAL CORD STIMULATOR INSERTION    Home Medications:    Medication List       This list is accurate as of: 01/13/15 11:59 PM.  Always use your most recent med list.               amLODipine 10 MG tablet  Commonly known as:  NORVASC  Take 10 mg by mouth daily.     aspirin EC 81 MG tablet  Take 81 mg by mouth daily.     atorvastatin 20 MG tablet  Commonly known as:  LIPITOR  Take 20 mg by mouth daily at 6 PM.     benazepril 20 MG tablet  Commonly known as:  LOTENSIN  Take 20 mg by mouth daily.     citalopram 40 MG tablet  Commonly known as:  CELEXA  Take 40 mg by mouth daily.     diazepam 10 MG tablet  Commonly known as:  VALIUM  Take 10 mg by mouth 2 (two) times daily. Take 2 tablets by mouth once daily     HYDROcodone-acetaminophen 10-325 MG tablet  Commonly known as:  NORCO  Take 1 tablet by mouth every 6 (six) hours as needed.     nabumetone 750 MG tablet  Commonly known as:  RELAFEN  Take 750 mg by mouth 2 (two) times  daily.     ondansetron 4 MG tablet  Commonly known as:  ZOFRAN      polyethylene glycol powder powder  Commonly known as:  GLYCOLAX/MIRALAX  Take as directed for colonoscopy prep.     sildenafil 100 MG tablet  Commonly known as:  VIAGRA  Take 100 mg by mouth daily as needed for erectile dysfunction. Reported on 01/13/2015     sulfamethoxazole-trimethoprim 800-160 MG tablet  Commonly known as:  BACTRIM DS,SEPTRA DS  Take 1 tablet by mouth 2 (two) times daily.     tadalafil 5 MG tablet  Commonly known as:  CIALIS  Take 1 tablet (5 mg total) by mouth daily as needed for erectile dysfunction.     Vitamin D3 2000 UNITS capsule  Take 2,000 Units by mouth daily.        Allergies:  Allergies  Allergen Reactions  . Propofol Other (See Comments)    URINARY RETENTION REQUIRING CATHETERIZATION  . Propranolol Other (See Comments)    Urinary retention    Family History: Family History  Problem Relation Age of Onset  . Benign prostatic hyperplasia Father   . Kidney disease Neg Hx   . Prostate cancer Father     Social History:  reports that he quit smoking about 48 years ago. His smoking use included Cigarettes. He quit after 2 years of use. He does not have any smokeless tobacco history on file. He reports that he drinks alcohol. He reports that he does not use illicit drugs.  ROS: UROLOGY Frequent Urination?: Yes Hard to postpone urination?: No Burning/pain with urination?: No Get up at night to urinate?: Yes Leakage of urine?: Yes Urine stream starts and stops?: Yes Trouble starting stream?: Yes Do you have to strain to urinate?: No Blood in urine?: No Urinary tract infection?: No Sexually transmitted disease?: No Injury to kidneys or bladder?: No Painful intercourse?: No Weak stream?: Yes Erection problems?: Yes Penile pain?: No  Gastrointestinal Nausea?: Yes Vomiting?: No Indigestion/heartburn?: No Diarrhea?: No Constipation?: No  Constitutional Fever: No Night sweats?: No Weight loss?: No Fatigue?: No  Skin Skin  rash/lesions?: No Itching?: No  Eyes Blurred vision?: No Double vision?: No  Ears/Nose/Throat Sore throat?: No Sinus problems?: No  Hematologic/Lymphatic Swollen glands?: No Easy bruising?: No  Cardiovascular Leg swelling?: No Chest pain?: No  Respiratory Cough?: No Shortness of breath?: No  Endocrine Excessive thirst?: No  Musculoskeletal Back pain?: Yes Joint pain?: Yes  Neurological Headaches?: No Dizziness?: No  Psychologic Depression?: No Anxiety?: Yes  Physical Exam: BP 171/90 mmHg  Pulse 80  Ht 6' (1.829 m)  Wt 365 lb 6.4 oz (165.744 kg)  BMI 49.55 kg/m2  GU: Patient with circumcised phallus.  Urethral meatus is patent.  No penile discharge. No penile lesions or rashes. Scrotum without lesions, cysts, rashes and/or edema.  Testicles are located scrotally bilaterally. No masses are appreciated in the testicles. Left and right epididymis are normal. Rectal: Patient with  normal sphincter tone. Perineum without scarring or rashes. No rectal masses are appreciated. Prostate is approximately 50 grams, no nodules are appreciated. Seminal vesicles are normal.  Laboratory Data: Lab Results  Component Value Date   WBC 9.5 07/08/2014   HGB 14.8 07/08/2014   HCT 42.3 07/08/2014   MCV 90.0 07/08/2014   PLT 175 07/08/2014    Lab Results  Component Value Date   CREATININE 0.93 07/08/2014   PSA history:      3.0 ng/mL on 11/25/2011      3.2 ng/mL  on 05/25/2012-biopsy in May      3.4 ng/mL on 09/11/2012      1.7 ng/mL on 03/01/2013      1.4 ng/mL on 06/30/2013      2.4 ng/mL on 11/09/2013      2.1 ng/mL on 03/11/2014  Lab Results  Component Value Date   PSA 2.0 01/06/2015   PSA 2.2 09/13/2014   PSA 2.1 03/12/2014    Assessment & Plan:   1. Prostate cancer:   Patient first underwent TRUSPBx of prostate in 2010 for a PSA of 4 and found to have a Gleason's 6. He underwent a second biopsy on 06/11/2102 and was found to have three cores positive for 3  + 3=6 Gleason's grade adenocarcinoma of the prostate.  Patient is currently on active surveillance.  His current PSA is 2.0 ng/mL on 01/06/2015.  He will follow-up in 4 months time for an exam and PSA.  2. BPH with LUTS:    Patient's IPSS score is 18/5.  Patient had been on tamsulosin in the past, but he did not find it effective. More recently, he had been on Cialis 5 mg daily. He found it effective for his symptoms, but he found that cost prohibitive.  He will follow up in four months for an I PSS score, exam and PSA.    3. Erectile dysfunction:   Patient has found treatment modalities for erectile dysfunction cost prohibitive.  We discussed intracavernosal injections, but he is not interested in pursuing that treatment modality at this time.  He will RTC in 6 months for a SHIM score and exam.    Return in about 4 months (around 05/14/2015), or IPPS score, SHIM score and exam.  Zara Council, [facility]  [place] [address], [place], [place] [address] [phone]

## 2015-05-12 ENCOUNTER — Other Ambulatory Visit: Payer: Medicare Other

## 2015-05-15 ENCOUNTER — Other Ambulatory Visit: Payer: Medicare Other

## 2015-05-15 ENCOUNTER — Encounter: Payer: Self-pay | Admitting: Urology

## 2015-05-16 ENCOUNTER — Other Ambulatory Visit: Payer: Medicare Other

## 2015-05-16 DIAGNOSIS — C61 Malignant neoplasm of prostate: Secondary | ICD-10-CM

## 2015-05-18 ENCOUNTER — Encounter: Payer: Self-pay | Admitting: Urology

## 2015-05-18 ENCOUNTER — Ambulatory Visit (INDEPENDENT_AMBULATORY_CARE_PROVIDER_SITE_OTHER): Payer: Medicare Other | Admitting: Urology

## 2015-05-18 VITALS — BP 161/79 | HR 74 | Ht 72.0 in | Wt 364.1 lb

## 2015-05-18 DIAGNOSIS — N401 Enlarged prostate with lower urinary tract symptoms: Secondary | ICD-10-CM | POA: Diagnosis not present

## 2015-05-18 DIAGNOSIS — C61 Malignant neoplasm of prostate: Secondary | ICD-10-CM

## 2015-05-18 DIAGNOSIS — N528 Other male erectile dysfunction: Secondary | ICD-10-CM

## 2015-05-18 DIAGNOSIS — N529 Male erectile dysfunction, unspecified: Secondary | ICD-10-CM

## 2015-05-18 DIAGNOSIS — N138 Other obstructive and reflux uropathy: Secondary | ICD-10-CM

## 2015-05-18 LAB — PSA: Prostate Specific Ag, Serum: 2.7 ng/mL (ref 0.0–4.0)

## 2015-05-18 NOTE — Progress Notes (Signed)
8:30 AM   Mordekai Wille [facility] 01-22-1951 [id number]  Referring provider: Sofie Hartigan, MD [place], [place] [address]  Chief Complaint  Patient presents with  . Prostate Cancer    4 MONTH follow up  . Erectile Dysfunction    HPI: Mr. Crumbliss is a 65 year old Caucasian male with prostate cancer, BPH with LUTS and erectile dysfunction who presents today for a 4 month follow-up.  Prostate cancer Patient first underwent TRUSPBx of prostate in 2010 for a PSA of 4 and found to have a Gleason's 6. 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  His most recent PSA at the time of this visit was 2.7 ng/mL on 05/16/2015.    BPH WITH LUTS: His IPSS score today is 18, which is moderate lower urinary tract symptomatology. He is unhappy with his quality life due to his urinary symptoms. His major complaint today is the frequency of urination..  He has had these symptoms for several years.  He denies any dysuria, hematuria or suprapubic pain.  Patient found good symptom control on Cialis 5 mg daily, but he found the prescription cost prohibitive and had to discontinue the medication.  He did not find tamsulosin effective.  He also denies any recent fevers, chills, nausea or vomiting.  He has a family history of PCa, with his father being diagnosed with PCa.       IPSS      05/18/15 0800       International Prostate Symptom Score   How often have you had the sensation of not emptying your bladder? Less than half the time     How often have you had to urinate less than every two hours? Almost always     How often have you found you stopped and started again several times when you urinated? Not at All     How often have you found it difficult to postpone urination? Almost always     How often have you had a weak urinary stream? Almost always      How often have you had to strain to start urination? Not at All     How many times did you typically get up at night to urinate? 1 Time     Total IPSS Score 18     Quality of Life due to urinary symptoms   If you were to spend the rest of your life with your urinary condition just the way it is now how would you feel about that? Unhappy        Score:  1-7 Mild 8-19 Moderate 20-35 Severe  Erectile dysfunction His SHIM score is 7, which is severe.   He has been having difficulty with erections for several years.   His major complaint is the inability to achieve an erection.  His libido is preserved.   His risk factors for ED are prostate cancer, BPH, hypertension and hyperlipidemia.  He denies any painful erections or curvatures with his erections.   He has tried PDE 5 inhibitors and vacuum erect aid device in the past.  He is not interested in a penile prosthesis, urethral pellets or Trimix injections.        SHIM      05/18/15 0839       SHIM: Over the last 6 months:   How do you  rate your confidence that you could get and keep an erection? Very Low     When you had erections with sexual stimulation, how often were your erections hard enough for penetration (entering your partner)? A Few Times (much less than half the time)     During sexual intercourse, how often were you able to maintain your erection after you had penetrated (entered) your partner? Very Difficult     During sexual intercourse, how difficult was it to maintain your erection to completion of intercourse? Extremely Difficult     When you attempted sexual intercourse, how often was it satisfactory for you? Extremely Difficult     SHIM Total Score   SHIM 7        Score: 1-7 Severe ED 8-11 Moderate ED 12-16 Mild-Moderate ED 17-21 Mild ED 22-25 No ED      PMH: Past Medical History  Diagnosis Date  . Cancer [facility]) 2011    Prostate Ca  . ED (erectile dysfunction)   . Hypertension   . Obesity   .  Prostatitis   . Over weight   . Urinary retention   . History of hiatal hernia   . Hyperlipemia   . Anxiety   . PTSD (post-traumatic stress disorder)   . History of kidney stones   . History of blood product transfusion   . Complication of anesthesia     urinary retention with Popanol  . Presence of permanent cardiac pacemaker     Surgical History: Past Surgical History  Procedure Laterality Date  . Back surgery  1988, 2006    Lumbar Spine  . Joint replacement  2004, 2005, 2014, 2015    Bilat Hip Replacements  . Cardiac pacemaker placement    . Appendectomy    . Cervical fusion    . Tonsillectomy    . Back surgery      x2 Laminectomy  . Colonoscopy    . Spinal cord stimulator insertion N/A 07/08/2014    Procedure: LUMBAR SPINAL CORD STIMULATOR INSERTION;  Surgeon: Clydell Hakim, MD;  Location: [place];  Service: Neurosurgery;  Laterality: N/A;  LUMBAR SPINAL CORD STIMULATOR INSERTION    Home Medications:    Medication List       This list is accurate as of: 05/18/15 11:59 PM.  Always use your most recent med list.               amLODipine 10 MG tablet  Commonly known as:  NORVASC  Take 10 mg by mouth daily.     aspirin EC 81 MG tablet  Take 81 mg by mouth daily.     atorvastatin 20 MG tablet  Commonly known as:  LIPITOR  Take 20 mg by mouth daily at 6 PM.     benazepril 20 MG tablet  Commonly known as:  LOTENSIN  Take 20 mg by mouth daily.     citalopram 40 MG tablet  Commonly known as:  CELEXA  Take 40 mg by mouth daily.     diazepam 10 MG tablet  Commonly known as:  VALIUM  Take 10 mg by mouth 2 (two) times daily. Take 2 tablets by mouth once daily     hydrALAZINE 25 MG tablet  Commonly known as:  APRESOLINE  TK 1 T PO BID     HYDROcodone-acetaminophen 10-325 MG tablet  Commonly known as:  NORCO  Take 1 tablet by mouth every 6 (six) hours as needed.     nabumetone 750 MG tablet  Commonly known as:  RELAFEN  Take 750 mg by mouth 2 (two) times  daily. Reported on 05/18/2015     ondansetron 4 MG tablet  Commonly known as:  ZOFRAN     polyethylene glycol powder powder  Commonly known as:  GLYCOLAX/MIRALAX  Reported on 05/18/2015     sildenafil 100 MG tablet  Commonly known as:  VIAGRA  Take 100 mg by mouth daily as needed for erectile dysfunction. Reported on 05/18/2015     sulfamethoxazole-trimethoprim 800-160 MG tablet  Commonly known as:  BACTRIM DS,SEPTRA DS  Take 1 tablet by mouth 2 (two) times daily.     tadalafil 5 MG tablet  Commonly known as:  CIALIS  Take 1 tablet (5 mg total) by mouth daily as needed for erectile dysfunction.     Vitamin D3 2000 units capsule  Take 2,000 Units by mouth daily.        Allergies:  Allergies  Allergen Reactions  . Propofol Other (See Comments)    URINARY RETENTION REQUIRING CATHETERIZATION  . Propranolol Other (See Comments)    Urinary retention    Family History: Family History  Problem Relation Age of Onset  . Benign prostatic hyperplasia Father   . Kidney disease Neg Hx   . Prostate cancer Father     Social History:  reports that he quit smoking about 48 years ago. His smoking use included Cigarettes. He quit after 2 years of use. He does not have any smokeless tobacco history on file. He reports that he drinks alcohol. He reports that he does not use illicit drugs.  ROS: UROLOGY Frequent Urination?: Yes Hard to postpone urination?: No Burning/pain with urination?: No Get up at night to urinate?: No Leakage of urine?: Yes Urine stream starts and stops?: No Trouble starting stream?: No Do you have to strain to urinate?: No Blood in urine?: No Urinary tract infection?: No Sexually transmitted disease?: No Injury to kidneys or bladder?: No Painful intercourse?: No Weak stream?: Yes Erection problems?: Yes Penile pain?: No  Gastrointestinal Nausea?: Yes Vomiting?: No Indigestion/heartburn?: No Diarrhea?: No Constipation?: No  Constitutional Fever:  No Night sweats?: No Weight loss?: No Fatigue?: Yes  Skin Skin rash/lesions?: No Itching?: Yes  Eyes Blurred vision?: No Double vision?: No  Ears/Nose/Throat Sore throat?: No Sinus problems?: No  Hematologic/Lymphatic Swollen glands?: No Easy bruising?: No  Cardiovascular Leg swelling?: No Chest pain?: No  Respiratory Cough?: No Shortness of breath?: No  Endocrine Excessive thirst?: No  Musculoskeletal Back pain?: Yes Joint pain?: Yes  Neurological Headaches?: No Dizziness?: No  Psychologic Depression?: Yes Anxiety?: Yes  Physical Exam: BP 161/79 mmHg  Pulse 74  Ht 6' (1.829 m)  Wt 364 lb 1.6 oz (165.155 kg)  BMI 49.37 kg/m2  GU: Patient with circumcised phallus.  Urethral meatus is patent.  No penile discharge. No penile lesions or rashes. Scrotum without lesions, cysts, rashes and/or edema.  Testicles are located scrotally bilaterally. No masses are appreciated in the testicles. Left and right epididymis are normal. Rectal: Patient with  normal sphincter tone. Perineum without scarring or rashes. No rectal masses are appreciated. Prostate is approximately 50 grams, no nodules are appreciated. Seminal vesicles are normal.  Laboratory Data: Lab Results  Component Value Date   WBC 9.5 07/08/2014   HGB 14.8 07/08/2014   HCT 42.3 07/08/2014   MCV 90.0 07/08/2014   PLT 175 07/08/2014    Lab Results  Component Value Date   CREATININE 0.93 07/08/2014   PSA history:  3.0 ng/mL on 11/25/2011      3.2 ng/mL on 05/25/2012-biopsy in May      3.4 ng/mL on 09/11/2012      1.7 ng/mL on 03/01/2013      1.4 ng/mL on 06/30/2013      2.4 ng/mL on 11/09/2013      2.1 ng/mL on 03/11/2014      2.7 ng/mL on 05/06/2015   Assessment & Plan:   1. Prostate cancer:   Patient first underwent TRUSPBx of prostate in 2010 for a PSA of 4 and found to have a Gleason's 6. He underwent a second biopsy on 06/11/2102 and was found to have three cores positive for 3 +  3=6 Gleason's grade adenocarcinoma of the prostate.  Patient is currently on active surveillance.  His current PSA is 2.7 ng/mL on 05/06/2015.  He will follow-up in 6 months time for an exam and PSA.  2. BPH with LUTS:    Patient's IPSS score is 18/5.  He had been on Cialis 5 mg daily. He found it effective for his symptoms, but he found that cost prohibitive.  He will follow up in 6 months for an I PSS score, exam and PSA.    3. Erectile dysfunction:   Patient has found treatment modalities for erectile dysfunction cost prohibitive.  We discussed intracavernosal injections, but he is not interested in pursuing that treatment modality at this time.  He will RTC in 6 months for a SHIM score and exam.    Return in about 6 months (around 11/17/2015) for IPSS, SHIM and exam.  Zara Council, [facility]  [place] [address] [place], [place] [address] [phone]

## 2015-05-22 ENCOUNTER — Encounter: Payer: Self-pay | Admitting: Urology

## 2015-11-13 ENCOUNTER — Other Ambulatory Visit: Payer: Medicare Other

## 2015-11-13 ENCOUNTER — Other Ambulatory Visit: Payer: Self-pay

## 2015-11-13 DIAGNOSIS — C61 Malignant neoplasm of prostate: Secondary | ICD-10-CM

## 2015-11-14 ENCOUNTER — Other Ambulatory Visit: Payer: Medicare Other

## 2015-11-14 LAB — PSA: Prostate Specific Ag, Serum: 2.6 ng/mL (ref 0.0–4.0)

## 2015-11-20 ENCOUNTER — Ambulatory Visit (INDEPENDENT_AMBULATORY_CARE_PROVIDER_SITE_OTHER): Payer: Medicare Other | Admitting: Urology

## 2015-11-20 ENCOUNTER — Encounter: Payer: Self-pay | Admitting: Urology

## 2015-11-20 ENCOUNTER — Other Ambulatory Visit: Payer: Self-pay | Admitting: Family Medicine

## 2015-11-20 VITALS — BP 153/97 | HR 85 | Ht 72.0 in | Wt 363.5 lb

## 2015-11-20 DIAGNOSIS — N401 Enlarged prostate with lower urinary tract symptoms: Secondary | ICD-10-CM

## 2015-11-20 DIAGNOSIS — N138 Other obstructive and reflux uropathy: Secondary | ICD-10-CM | POA: Diagnosis not present

## 2015-11-20 DIAGNOSIS — C61 Malignant neoplasm of prostate: Secondary | ICD-10-CM | POA: Diagnosis not present

## 2015-11-20 NOTE — Progress Notes (Signed)
8:52 AM   [facility] 1950-05-14 [id number]  Referring provider: Sofie Hartigan, MD [address], [place] [address]  Chief Complaint  Patient presents with  . Prostate Cancer    6 month follow up  . Benign Prostatic Hypertrophy    HPI: [name] is a 65 year old Caucasian male with prostate cancer, BPH with LUTS and erectile dysfunction who presents today for a 6 month follow-up.  Prostate cancer Patient first underwent TRUSPBx of prostate in 2010 for a PSA of 4 and found to have a Gleason's 6. 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  His most recent PSA at the time of this visit was 2.7 ng/mL on 05/16/2015.    BPH WITH LUTS: His IPSS score today is 19, which is moderate lower urinary tract symptomatology. He is terrible with his quality life due to his urinary symptoms. His previous IPSS score was 18/5.  His major complaint today is the frequency of urination..  He has had these symptoms for several years.  He denies any dysuria, hematuria or suprapubic pain.  Patient found good symptom control on Cialis 5 mg daily, but he found the prescription cost prohibitive and had to discontinue the medication.  He did not find tamsulosin effective.  He also denies any recent fevers, chills, nausea or vomiting.  He has a family history of PCa, with his father being diagnosed with PCa.       [place] Name 11/20/15 0800         International Prostate Symptom Score   How often have you had the sensation of not emptying your bladder? About half the time     How often have you had to urinate less than every two hours? Almost always     How often have you found you stopped and started again several times when you urinated? Less than half the time     How often have you found it difficult to postpone urination? About half the  time     How often have you had a weak urinary stream? Almost always     How often have you had to strain to start urination? Not at All     How many times did you typically get up at night to urinate? 1 Time     Total IPSS Score 19       Quality of Life due to urinary symptoms   If you were to spend the rest of your life with your urinary condition just the way it is now how would you feel about that? Terrible        Score:  1-7 Mild 8-19 Moderate 20-35 Severe   PMH: Past Medical History:  Diagnosis Date  . Anxiety   . Cancer [facility]) 2011   Prostate Ca  . Complication of anesthesia    urinary retention with Popanol  . ED (erectile dysfunction)   . History of blood product transfusion   . History of hiatal hernia   . History of kidney stones   . Hyperlipemia   . Hypertension   . Obesity   . Over weight   . Presence of permanent cardiac pacemaker   . Prostatitis   . PTSD (post-traumatic stress disorder)   . Urinary retention     Surgical History: Past Surgical History:  Procedure Laterality Date  . APPENDECTOMY    . [place], 2006   Lumbar Spine  . BACK SURGERY     x2 Laminectomy  . CARDIAC PACEMAKER PLACEMENT    . CERVICAL FUSION    . COLONOSCOPY    . JOINT REPLACEMENT  2004, 2005, 2014, 2015   Bilat Hip Replacements  . SPINAL CORD STIMULATOR INSERTION N/A 07/08/2014   Procedure: LUMBAR SPINAL CORD STIMULATOR INSERTION;  Surgeon: Clydell Hakim, MD;  Location: [place];  Service: Neurosurgery;  Laterality: N/A;  LUMBAR SPINAL CORD STIMULATOR INSERTION  . TONSILLECTOMY      Home Medications:    Medication List       Accurate as of 11/20/15  8:52 AM. Always use your most recent med list.          amLODipine 10 MG tablet Commonly known as:  NORVASC Take 10 mg by mouth daily.   aspirin EC 81 MG tablet Take 81 mg by mouth daily.   atorvastatin 20 MG tablet Commonly known as:  LIPITOR Take 20 mg by mouth daily at 6 PM.   benazepril 20 MG  tablet Commonly known as:  LOTENSIN Take 20 mg by mouth daily.   citalopram 40 MG tablet Commonly known as:  CELEXA Take 40 mg by mouth daily.   diazepam 10 MG tablet Commonly known as:  VALIUM Take 10 mg by mouth 2 (two) times daily. Take 2 tablets by mouth once daily   hydrALAZINE 25 MG tablet Commonly known as:  APRESOLINE TK 1 T PO BID   HYDROcodone-acetaminophen 10-325 MG tablet Commonly known as:  NORCO Take 1 tablet by mouth every 6 (six) hours as needed.   nabumetone 750 MG tablet Commonly known as:  RELAFEN Take 750 mg by mouth 2 (two) times daily. Reported on 05/18/2015   ondansetron 4 MG tablet Commonly known as:  ZOFRAN   pantoprazole 40 MG tablet Commonly known as:  PROTONIX Take by mouth.   polyethylene glycol powder powder Commonly known as:  GLYCOLAX/MIRALAX Reported on 05/18/2015   sildenafil 100 MG tablet Commonly known as:  VIAGRA Take 100 mg by mouth daily as needed for erectile dysfunction. Reported on 05/18/2015   sulfamethoxazole-trimethoprim 800-160 MG tablet Commonly known as:  BACTRIM DS,SEPTRA DS Take 1 tablet by mouth 2 (two) times daily.   tadalafil 5 MG tablet Commonly known as:  CIALIS Take 1 tablet (5 mg total) by mouth daily as needed for erectile dysfunction.   Vitamin D3 2000 units capsule Take 2,000 Units by mouth daily.       Allergies:  Allergies  Allergen Reactions  . Propofol Other (See Comments)    URINARY RETENTION REQUIRING CATHETERIZATION  . Propranolol Other (See Comments)    Urinary retention    Family History: Family History  Problem Relation Age of Onset  . Benign prostatic hyperplasia Father   . Prostate cancer Father   . Kidney disease Neg Hx     Social History:  reports that he quit smoking about 49 years ago. His smoking use included Cigarettes. He quit after 2.00 years of use. He has never used smokeless tobacco. He reports that he drinks alcohol. He reports that he does not use  drugs.  ROS: UROLOGY Frequent Urination?: Yes Hard to postpone urination?: Yes Burning/pain with urination?: No Get up at night to urinate?: No Leakage of urine?: Yes Urine stream starts and stops?: Yes Trouble starting stream?: No Do you have to strain to urinate?: No Blood in  urine?: No Urinary tract infection?: No Sexually transmitted disease?: No Injury to kidneys or bladder?: No Painful intercourse?: No Weak stream?: Yes Erection problems?: No Penile pain?: Yes  Gastrointestinal Nausea?: Yes Vomiting?: No Indigestion/heartburn?: No Diarrhea?: Yes Constipation?: No  Constitutional Fever: No Night sweats?: No Weight loss?: No Fatigue?: Yes  Skin Skin rash/lesions?: No Itching?: Yes  Eyes Blurred vision?: No Double vision?: No  Ears/Nose/Throat Sore throat?: No Sinus problems?: No  Hematologic/Lymphatic Swollen glands?: No Easy bruising?: No  Cardiovascular Leg swelling?: No Chest pain?: No  Respiratory Cough?: No Shortness of breath?: No  Endocrine Excessive thirst?: No  Musculoskeletal Back pain?: Yes Joint pain?: Yes  Neurological Headaches?: No Dizziness?: No  Psychologic Depression?: No Anxiety?: Yes  Physical Exam: BP (!) 153/97   Pulse 85   Ht 6' (1.829 m)   Wt (!) 363 lb 8 oz (164.9 kg)   BMI 49.30 kg/m   GU: Patient with circumcised phallus.  Urethral meatus is patent.  No penile discharge. No penile lesions or rashes. Scrotum without lesions, cysts, rashes and/or edema.  Testicles are located scrotally bilaterally. No masses are appreciated in the testicles. Left and right epididymis are normal. Rectal: Patient with  normal sphincter tone. Perineum without scarring or rashes. No rectal masses are appreciated. Prostate is approximately 50 grams, no nodules are appreciated. Seminal vesicles are normal.  Laboratory Data: Lab Results  Component Value Date   WBC 9.5 07/08/2014   HGB 14.8 07/08/2014   HCT 42.3 07/08/2014    MCV 90.0 07/08/2014   PLT 175 07/08/2014    Lab Results  Component Value Date   CREATININE 0.93 07/08/2014   PSA history:      3.0 ng/mL on 11/25/2011      3.2 ng/mL on 05/25/2012-biopsy in May      3.4 ng/mL on 09/11/2012      1.7 ng/mL on 03/01/2013      1.4 ng/mL on 06/30/2013      2.4 ng/mL on 11/09/2013      2.1 ng/mL on 03/11/2014      2.7 ng/mL on 05/06/2015      2.6 ng/mL on 11/13/2015   Assessment & Plan:   1. Prostate cancer:   Patient first underwent TRUSPBx of prostate in 2010 for a PSA of 4 and found to have a Gleason's 6. 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  Patient is currently on active surveillance.  His current PSA is 2.6 ng/mL on 10/16//2017.  He will follow-up in 6 months time for an exam and PSA.  2. BPH with LUTS  - IPSS score is 19/6, it is worsening  - Continue conservative management, avoiding bladder irritants and timed voiding's  - RTC in 6 months for IPSS, PSA, PVR and exam    Return in about 6 months (around 05/20/2016) for IPSS, PSA and exam.  Zara Council, [facility]  [place] [address], [place], [place] [address] [phone]

## 2015-12-07 DIAGNOSIS — I459 Conduction disorder, unspecified: Secondary | ICD-10-CM | POA: Insufficient documentation

## 2016-01-25 ENCOUNTER — Encounter: Payer: Self-pay | Admitting: Urology

## 2016-01-29 ENCOUNTER — Other Ambulatory Visit: Payer: Self-pay | Admitting: Urology

## 2016-01-29 MED ORDER — SILDENAFIL CITRATE 100 MG PO TABS: 100.0000 mg | ORAL_TABLET | Freq: Every day | ORAL | 12 refills | Status: DC | PRN

## 2016-02-27 ENCOUNTER — Encounter
Admission: RE | Admit: 2016-02-27 | Discharge: 2016-02-27 | Disposition: A | Discharge status: Home / Self Care | Payer: Medicare Other | Source: Ambulatory Visit | Attending: Surgery | Admitting: Surgery

## 2016-02-27 DIAGNOSIS — Z01818 Encounter for other preprocedural examination: Secondary | ICD-10-CM | POA: Insufficient documentation

## 2016-02-27 HISTORY — DX: Unspecified osteoarthritis, unspecified site: M19.90

## 2016-02-27 HISTORY — DX: Polyneuropathy, unspecified: G62.9

## 2016-02-27 HISTORY — DX: Sleep apnea, unspecified: G47.30

## 2016-02-27 HISTORY — DX: Depression, unspecified: F32.A

## 2016-02-27 HISTORY — DX: Carpal tunnel syndrome, bilateral upper limbs: G56.03

## 2016-02-27 HISTORY — DX: Major depressive disorder, single episode, unspecified: F32.9

## 2016-02-27 LAB — CBC
HCT: 47.6 % (ref 40.0–52.0)
Hemoglobin: 16.1 g/dL (ref 13.0–18.0)
MCH: 31.4 pg (ref 26.0–34.0)
MCHC: 33.9 g/dL (ref 32.0–36.0)
MCV: 92.8 fL (ref 80.0–100.0)
Platelets: 202 10*3/uL (ref 150–440)
RBC: 5.13 MIL/uL (ref 4.40–5.90)
RDW: 13.5 % (ref 11.5–14.5)
WBC: 12.5 10*3/uL — ABNORMAL HIGH (ref 3.8–10.6)

## 2016-02-27 LAB — COMPREHENSIVE METABOLIC PANEL
ALT: 20 U/L (ref 17–63)
AST: 20 U/L (ref 15–41)
Albumin: 4 g/dL (ref 3.5–5.0)
Alkaline Phosphatase: 89 U/L (ref 38–126)
Anion gap: 9 (ref 5–15)
BUN: 16 mg/dL (ref 6–20)
CO2: 24 mmol/L (ref 22–32)
Calcium: 9 mg/dL (ref 8.9–10.3)
Chloride: 108 mmol/L (ref 101–111)
Creatinine, Ser: 1.08 mg/dL (ref 0.61–1.24)
GFR calc Af Amer: >60 mL/min (ref >60)
GFR calc non Af Amer: >60 mL/min (ref >60)
Glucose, Bld: 96 mg/dL (ref 65–99)
Potassium: 3.9 mmol/L (ref 3.5–5.1)
Sodium: 141 mmol/L (ref 135–145)
Total Bilirubin: 0.9 mg/dL (ref 0.3–1.2)
Total Protein: 7.2 g/dL (ref 6.5–8.1)

## 2016-02-27 LAB — DIFFERENTIAL
Basophils Absolute: 0.1 10*3/uL (ref 0–0.1)
Basophils Relative: 1 %
Eosinophils Absolute: 0.4 10*3/uL (ref 0–0.7)
Eosinophils Relative: 3 %
Lymphocytes Relative: 13 %
Lymphs Abs: 1.6 10*3/uL (ref 1.0–3.6)
Monocytes Absolute: 0.8 10*3/uL (ref 0.2–1.0)
Monocytes Relative: 7 %
Neutro Abs: 9.6 10*3/uL — ABNORMAL HIGH (ref 1.4–6.5)
Neutrophils Relative %: 76 %

## 2016-02-27 NOTE — Patient Instructions (Signed)
  Your procedure is scheduled on: March 08, 2016 (Friday) Report to Same Day Surgery 2nd floor medical mall [facility] Entrance-take elevator on left to 2nd floor.  Check in with surgery information desk.) To find out your arrival time please call [phone] between 1PM - 3PM on March 07, 2016 (Thursday)  Remember: Instructions that are not followed completely may result in serious medical risk, up to and including death, or upon the discretion of your surgeon and anesthesiologist your surgery may need to be rescheduled.    _x___ 1. Do not eat food or drink liquids after midnight. No gum chewing or hard candies.     __x__ 2. No Alcohol for 24 hours before or after surgery.   __x__3. No Smoking for 24 prior to surgery.   ____  4. Bring all medications with you on the day of surgery if instructed.    __x__ 5. Notify your doctor if there is any change in your medical condition     (cold, fever, infections).     Do not wear jewelry, make-up, hairpins, clips or nail polish.  Do not wear lotions, powders, or perfumes. You may wear deodorant.  Do not shave 48 hours prior to surgery. Men may shave face and neck.  Do not bring valuables to the hospital.    [facility] is not responsible for any belongings or valuables.               Contacts, dentures or bridgework may not be worn into surgery.  Leave your suitcase in the car. After surgery it may be brought to your room.  For patients admitted to the hospital, discharge time is determined by your treatment team.   Patients discharged the day of surgery will not be allowed to drive home.  You will need someone to drive you home and stay with you the night of your procedure.    Please read over the following fact sheets that you were given:   [facility] Preparing for Surgery and or MRSA Information   _x___ Take these medicines the morning of surgery with A SIP OF WATER:    1. AMLODIPINE  2. BENAZEPRIL  3.  HydrALAZINE  4.  5.  6.  ____Fleets enema or Magnesium Citrate as directed.   _x___ Use CHG Soap or sage wipes as directed on instruction sheet   ____ Use inhalers on the day of surgery and bring to hospital day of surgery  ____ Stop metformin 2 days prior to surgery    ____ Take 1/2 of usual insulin dose the night before surgery and none on the morning of           surgery.   __x__ Stop Aspirin, Coumadin, Pllavix ,Eliquis, Effient, or Pradaxa (STOP ASPIRIN ONE WEEK PRIOR TO SURGERY)  x__ Stop Anti-inflammatories such as Advil, Aleve, Ibuprofen, Motrin, Naproxen,          Naprosyn, Goodies powders or aspirin products. Ok to take Tylenol.   ____ Stop supplements until after surgery.    __x__ Bring C-Pap to the hospital. (BRING BI-PAP [place] )       X     [place] OF SURGERY

## 2016-03-08 ENCOUNTER — Encounter: Payer: Self-pay | Admitting: *Deleted

## 2016-03-08 ENCOUNTER — Encounter
Admission: RE | Disposition: A | Discharge status: Home / Self Care | Payer: Self-pay | Source: Ambulatory Visit | Attending: Surgery

## 2016-03-08 ENCOUNTER — Ambulatory Visit: Payer: Medicare Other | Admitting: Anesthesiology

## 2016-03-08 ENCOUNTER — Ambulatory Visit
Admission: RE | Admit: 2016-03-08 | Discharge: 2016-03-08 | Disposition: A | Discharge status: Home / Self Care | Payer: Medicare Other | Source: Ambulatory Visit | Attending: Surgery | Admitting: Surgery

## 2016-03-08 DIAGNOSIS — Z79899 Other long term (current) drug therapy: Secondary | ICD-10-CM | POA: Diagnosis not present

## 2016-03-08 DIAGNOSIS — I10 Essential (primary) hypertension: Secondary | ICD-10-CM | POA: Diagnosis not present

## 2016-03-08 DIAGNOSIS — D171 Benign lipomatous neoplasm of skin and subcutaneous tissue of trunk: Secondary | ICD-10-CM | POA: Diagnosis not present

## 2016-03-08 DIAGNOSIS — R1900 Intra-abdominal and pelvic swelling, mass and lump, unspecified site: Secondary | ICD-10-CM | POA: Diagnosis present

## 2016-03-08 DIAGNOSIS — G4730 Sleep apnea, unspecified: Secondary | ICD-10-CM | POA: Insufficient documentation

## 2016-03-08 DIAGNOSIS — Z950 Presence of cardiac pacemaker: Secondary | ICD-10-CM | POA: Insufficient documentation

## 2016-03-08 DIAGNOSIS — Z96642 Presence of left artificial hip joint: Secondary | ICD-10-CM | POA: Insufficient documentation

## 2016-03-08 HISTORY — PX: EXCISION OF ABDOMINAL WALL TUMOR: SHX6687

## 2016-03-08 HISTORY — DX: Failed or difficult intubation, initial encounter: T88.4XXA

## 2016-03-08 SURGERY — EXCISION, NEOPLASM, ABDOMINAL WALL
Anesthesia: General | Site: Abdomen | Wound class: Clean

## 2016-03-08 MED ORDER — LIDOCAINE HCL (CARDIAC) 20 MG/ML IV SOLN
INTRAVENOUS | Status: DC | PRN
  Administered 2016-03-08: 100 mg via INTRAVENOUS

## 2016-03-08 MED ORDER — FENTANYL CITRATE (PF) 100 MCG/2ML IJ SOLN
INTRAMUSCULAR | Status: DC | PRN
Start: 2016-03-08 — End: 2016-03-08
  Administered 2016-03-08: 100 ug via INTRAVENOUS

## 2016-03-08 MED ORDER — OXYCODONE HCL 5 MG PO TABS: 5.0000 mg | ORAL_TABLET | Freq: Once | ORAL | Status: DC | PRN

## 2016-03-08 MED ORDER — LACTATED RINGERS IV SOLN
INTRAVENOUS | Status: DC
  Administered 2016-03-08: 07:00:00 via INTRAVENOUS

## 2016-03-08 MED ORDER — ETOMIDATE 2 MG/ML IV SOLN
INTRAVENOUS | Status: DC | PRN
  Administered 2016-03-08: 40 mg via INTRAVENOUS

## 2016-03-08 MED ORDER — FENTANYL CITRATE (PF) 100 MCG/2ML IJ SOLN: 25.0000 ug | INTRAMUSCULAR | Status: DC | PRN

## 2016-03-08 MED ORDER — HYDROCODONE-ACETAMINOPHEN 5-325 MG PO TABS: 1.0000 | ORAL_TABLET | ORAL | 0 refills | Status: DC | PRN

## 2016-03-08 MED ORDER — DEXAMETHASONE SODIUM PHOSPHATE 10 MG/ML IJ SOLN
INTRAMUSCULAR | Status: DC | PRN
  Administered 2016-03-08: 5 mg via INTRAVENOUS

## 2016-03-08 MED ORDER — SUGAMMADEX SODIUM 200 MG/2ML IV SOLN
INTRAVENOUS | Status: DC | PRN
  Administered 2016-03-08: 200 mg via INTRAVENOUS

## 2016-03-08 MED ORDER — SUCCINYLCHOLINE CHLORIDE 20 MG/ML IJ SOLN
INTRAMUSCULAR | Status: DC | PRN
  Administered 2016-03-08: 200 mg via INTRAVENOUS

## 2016-03-08 MED ORDER — EPHEDRINE SULFATE 50 MG/ML IJ SOLN
INTRAMUSCULAR | Status: DC | PRN
  Administered 2016-03-08: 15 mg via INTRAVENOUS

## 2016-03-08 MED ORDER — FAMOTIDINE 20 MG PO TABS
20.0000 mg | ORAL_TABLET | Freq: Once | ORAL | Status: AC
  Administered 2016-03-08: 20 mg via ORAL

## 2016-03-08 MED ORDER — OXYCODONE HCL 5 MG/5ML PO SOLN: 5.0000 mg | Freq: Once | ORAL | Status: DC | PRN

## 2016-03-08 MED ORDER — GLYCOPYRROLATE 0.2 MG/ML IJ SOLN
INTRAMUSCULAR | Status: AC
  Filled 2016-03-08: qty 2

## 2016-03-08 MED ORDER — BUPIVACAINE-EPINEPHRINE (PF) 0.5% -1:200000 IJ SOLN
INTRAMUSCULAR | Status: AC
  Filled 2016-03-08: qty 30

## 2016-03-08 MED ORDER — PROPOFOL 10 MG/ML IV BOLUS
INTRAVENOUS | Status: AC
  Filled 2016-03-08: qty 20

## 2016-03-08 MED ORDER — ETOMIDATE 2 MG/ML IV SOLN
INTRAVENOUS | Status: AC
  Filled 2016-03-08: qty 20

## 2016-03-08 MED ORDER — HYDROCODONE-ACETAMINOPHEN 5-325 MG PO TABS: 1.0000 | ORAL_TABLET | ORAL | Status: DC | PRN

## 2016-03-08 MED ORDER — BUPIVACAINE-EPINEPHRINE 0.5% -1:200000 IJ SOLN
INTRAMUSCULAR | Status: DC | PRN
  Administered 2016-03-08: 13 mL

## 2016-03-08 MED ORDER — EPHEDRINE SULFATE 50 MG/ML IJ SOLN
INTRAMUSCULAR | Status: AC
  Filled 2016-03-08: qty 1

## 2016-03-08 MED ORDER — MIDAZOLAM HCL 2 MG/2ML IJ SOLN
INTRAMUSCULAR | Status: AC
  Filled 2016-03-08: qty 2

## 2016-03-08 MED ORDER — ONDANSETRON HCL 4 MG/2ML IJ SOLN
INTRAMUSCULAR | Status: DC | PRN
  Administered 2016-03-08: 4 mg via INTRAVENOUS

## 2016-03-08 MED ORDER — ONDANSETRON HCL 4 MG/2ML IJ SOLN
INTRAMUSCULAR | Status: AC
  Filled 2016-03-08: qty 2

## 2016-03-08 MED ORDER — SUCCINYLCHOLINE CHLORIDE 200 MG/10ML IV SOSY
PREFILLED_SYRINGE | INTRAVENOUS | Status: AC
  Filled 2016-03-08: qty 10

## 2016-03-08 MED ORDER — PHENYLEPHRINE HCL 10 MG/ML IJ SOLN
INTRAMUSCULAR | Status: AC
  Filled 2016-03-08: qty 1

## 2016-03-08 MED ORDER — DEXAMETHASONE SODIUM PHOSPHATE 10 MG/ML IJ SOLN
INTRAMUSCULAR | Status: AC
  Filled 2016-03-08: qty 1

## 2016-03-08 MED ORDER — FAMOTIDINE 20 MG PO TABS
ORAL_TABLET | ORAL | Status: AC
  Administered 2016-03-08: 20 mg via ORAL
  Filled 2016-03-08: qty 1

## 2016-03-08 MED ORDER — SUGAMMADEX SODIUM 200 MG/2ML IV SOLN
INTRAVENOUS | Status: AC
  Filled 2016-03-08: qty 2

## 2016-03-08 MED ORDER — LIDOCAINE HCL (PF) 2 % IJ SOLN
INTRAMUSCULAR | Status: AC
  Filled 2016-03-08: qty 2

## 2016-03-08 MED ORDER — MIDAZOLAM HCL 2 MG/2ML IJ SOLN
INTRAMUSCULAR | Status: DC | PRN
  Administered 2016-03-08: 2 mg via INTRAVENOUS

## 2016-03-08 MED ORDER — ROCURONIUM BROMIDE 100 MG/10ML IV SOLN
INTRAVENOUS | Status: DC | PRN
  Administered 2016-03-08: 10 mg via INTRAVENOUS
  Administered 2016-03-08: 30 mg via INTRAVENOUS

## 2016-03-08 MED ORDER — PHENYLEPHRINE HCL 10 MG/ML IJ SOLN
INTRAMUSCULAR | Status: DC | PRN
  Administered 2016-03-08: 200 ug via INTRAVENOUS
  Administered 2016-03-08: 150 ug via INTRAVENOUS
  Administered 2016-03-08: 200 ug via INTRAVENOUS
  Administered 2016-03-08: 150 ug via INTRAVENOUS
  Administered 2016-03-08: 300 ug via INTRAVENOUS

## 2016-03-08 MED ORDER — ROCURONIUM BROMIDE 50 MG/5ML IV SOLN
INTRAVENOUS | Status: AC
  Filled 2016-03-08: qty 1

## 2016-03-08 MED ORDER — FENTANYL CITRATE (PF) 100 MCG/2ML IJ SOLN
INTRAMUSCULAR | Status: AC
  Filled 2016-03-08: qty 2

## 2016-03-08 SURGICAL SUPPLY — 27 items
CANISTER SUCT 1200ML W/VALVE (MISCELLANEOUS) ×2 IMPLANT
CATH TRAY 16F METER LATEX (MISCELLANEOUS) IMPLANT
CHLORAPREP W/TINT 26ML (MISCELLANEOUS) ×2 IMPLANT
DERMABOND ADVANCED (GAUZE/BANDAGES/DRESSINGS) ×1
DERMABOND ADVANCED .7 DNX12 (GAUZE/BANDAGES/DRESSINGS) ×1 IMPLANT
DRAPE LAPAROTOMY 100X77 ABD (DRAPES) ×2 IMPLANT
ELECT REM PT RETURN 9FT ADLT (ELECTROSURGICAL) ×2
ELECTRODE REM PT RTRN 9FT ADLT (ELECTROSURGICAL) ×1 IMPLANT
GLOVE BIO SURGEON STRL SZ7.5 (GLOVE) ×6 IMPLANT
GOWN STRL REUS W/ TWL LRG LVL3 (GOWN DISPOSABLE) ×2 IMPLANT
GOWN STRL REUS W/TWL LRG LVL3 (GOWN DISPOSABLE) ×2
KIT RM TURNOVER STRD PROC AR (KITS) ×2 IMPLANT
LABEL OR SOLS (LABEL) ×2 IMPLANT
NDL HPO THNWL 1X22GA REG BVL (NEEDLE) ×1 IMPLANT
NEEDLE HYPO 25GX1X1/2 BEV (NEEDLE) ×2 IMPLANT
NEEDLE SAFETY 22GX1 (NEEDLE) ×1
NS IRRIG 1000ML POUR BTL (IV SOLUTION) ×2 IMPLANT
PACK BASIN MAJOR ARMC (MISCELLANEOUS) ×2 IMPLANT
PACK COLON CLEAN CLOSURE (MISCELLANEOUS) IMPLANT
SPONGE LAP 18X18 5 PK (GAUZE/BANDAGES/DRESSINGS) IMPLANT
SUT CHROMIC 0 CT 1 (SUTURE) ×4 IMPLANT
SUT CHROMIC 2 0 SH (SUTURE) ×2 IMPLANT
SUT CHROMIC 3 0 SH 27 (SUTURE) IMPLANT
SUT CHROMIC BR 1/2CLE 2-0 54IN (SUTURE) ×2 IMPLANT
SUT MAXON ABS #0 GS21 30IN (SUTURE) ×4 IMPLANT
SUT VIC AB 5-0 RB1 27 (SUTURE) ×4 IMPLANT
SYR 20CC LL (SYRINGE) ×2 IMPLANT

## 2016-03-08 NOTE — Anesthesia Postprocedure Evaluation (Signed)
Anesthesia Post Note  Patient: Richard Duncan  Procedure(s) Performed: Procedure(s) (LRB): EXCISION OF ABDOMINAL WALL MASS (N/A)  Patient location during evaluation: PACU Anesthesia Type: General Level of consciousness: awake and alert Pain management: pain level controlled Vital Signs Assessment: post-procedure vital signs reviewed and stable Respiratory status: spontaneous breathing, nonlabored ventilation, respiratory function stable and patient connected to nasal cannula oxygen Cardiovascular status: blood pressure returned to baseline and stable Postop Assessment: no signs of nausea or vomiting Anesthetic complications: no Comments: Patient reports that he was able to void before his discharge home     Last Vitals:  Vitals:   03/08/16 0931 03/08/16 0934  BP: 134/69 137/71  Pulse: 77 79  Resp: 16 16  Temp: 36.7 C     Last Pain:  Vitals:   03/08/16 0919  TempSrc:   PainSc: 2                  Precious Haws [place]

## 2016-03-08 NOTE — Discharge Instructions (Signed)
Take Tylenol or Norco if needed for pain.  Should not drive or do anything dangerous when taking Norco.  Resume aspirin on Saturday.  May shower.  Resume usual activities as tolerated.

## 2016-03-08 NOTE — Anesthesia Procedure Notes (Signed)
Procedure Name: Intubation Date/Time: 03/08/2016 7:42 AM Performed by: Zetta Bills Pre-anesthesia Checklist: Patient identified, Emergency Drugs available, Suction available and Patient being monitored Patient Re-evaluated:Patient Re-evaluated prior to inductionOxygen Delivery Method: Circle system utilized Preoxygenation: Pre-oxygenation with 100% oxygen Intubation Type: IV induction and Cricoid Pressure applied Ventilation: Two handed mask ventilation required, Mask ventilation with difficulty and Oral airway inserted - appropriate to patient size Laryngoscope Size: Mac and 4 Grade View: Grade III Tube type: Oral Number of attempts: 3 Airway Equipment and Method: Stylet,  Video-laryngoscopy,  Patient positioned with wedge pillow and Oral airway Placement Confirmation: ETT inserted through vocal cords under direct vision,  positive ETCO2 and breath sounds checked- equal and bilateral Secured at: 22 cm Tube secured with: Tape Dental Injury: Teeth and Oropharynx as per pre-operative assessment  Difficulty Due To: Difficulty was anticipated, Difficult Airway- due to large tongue and Difficult Airway- due to reduced neck mobility Future Recommendations: Recommend- induction with short-acting agent, and alternative techniques readily available Comments: Large head and neck with redundant tissue and beard. Grade 3 with Mac 4, grade one via D blade glidescope

## 2016-03-08 NOTE — H&P (Signed)
He came today for excision 2 symptomatic masses of right abdominal wall.  The 2 masses were marked this AM on exam and right side marked YES  He reports no change in condition since office exam.  Discussed plan for surgery

## 2016-03-08 NOTE — Transfer of Care (Signed)
Immediate Anesthesia Transfer of Care Note  Patient: Richard Duncan  Procedure(s) Performed: Procedure(s): EXCISION OF ABDOMINAL WALL MASS (N/A)  Patient Location: PACU  Anesthesia Type:General  Level of Consciousness: awake  Airway & Oxygen Therapy: Patient Spontanous Breathing  Post-op Assessment: Report given to RN  Post vital signs: stable  Last Vitals:  Vitals:   03/08/16 0611  BP: (!) 131/102  Pulse: 99  Resp: 18  Temp: 36.6 C    Last Pain:  Vitals:   03/08/16 0611  TempSrc: Tympanic  PainSc: 6          Complications: No apparent anesthesia complications

## 2016-03-08 NOTE — CV Procedure (Signed)
OPERATIVE REPORT  PREOPERATIVE  DIAGNOSIS: . Two symptomatic abdominal wall masses.  POSTOPERATIVE DIAGNOSIS: . Two symptomatic abdominal wall masses  PROCEDURE: . Excision of two abdominal wall masses.  ANESTHESIA:  General  SURGEON: Rochel Brome  MD   INDICATIONS: . He reports a history of a palpable mass in the right mid abdominal wall which is been there for about 20 years and recently has been more symptomatic with mild pain. He also reports a more recent development of a mass in the right subcostal area which is also painful and tender. These were identified on physical exam each approximately 3 cm in dimension. These were further identified on the morning of surgery prior to going to the operating room. Ink marks were made at these 2 sites.  With the patient on the operating table in the supine position under general anesthesia the right abdomen was clipped and prepared with ChloraPrep and draped in a sterile manner. First the mass of the subcostal position in the right upper quadrant was excised with a transversely oriented incision ultimately some 7 cm in dimension and dissected down through subcutaneous tissues. Multiple small bleeding points were cauterized. Dissection was carried down several centimeters to encounter a well demarcated lipoma which appeared to be approximately 2.8 cm in dimension and was dissected free from surrounding structures. There were several different small fragments of adipose tissue which were removed during the course of dissection. The mass was submitted in formalin for routine pathology. Wound was inspected and found hemostasis was intact. The subcutaneous tissues were infiltrated with half percent Sensorcaine with epinephrine.  Next the mass in the right mid abdomen was excised by making a transversely oriented incision that was ultimately increased to some 8 cm in dimension and carried down through subcutaneous tissues. Electrocautery was used for  hemostasis. There was a mass which appeared to be a lipoma dissected free from surrounding structures approximally 3 cm in dimension and submitted in a separate formalin container for routine pathology. Several other small fragments of adipose tissue were submitted in the same container. The wound was inspected and could see hemostasis was intact. The subcutaneous tissues were infiltrated with half percent Sensorcaine with epinephrine.  Both incisions were closed with running 4-0 Monocryl subcuticular suture and Dermabond. The patient appeared to tolerate the procedure satisfactorily and was subsequently prepared for transfer to the recovery room  [facility].D.

## 2016-03-08 NOTE — Anesthesia Post-op Follow-up Note (Cosign Needed)
Anesthesia QCDR form completed.        

## 2016-03-08 NOTE — Anesthesia Preprocedure Evaluation (Addendum)
Anesthesia Evaluation  Patient identified by MRN, date of birth, ID band Patient awake    Reviewed: Allergy & Precautions, H&P , NPO status , Patient's Chart, lab work & pertinent test results  History of Anesthesia Complications (+) history of anesthetic complications  Airway Mallampati: II  TM Distance: >3 FB Neck ROM: full    Dental  (+) Poor Dentition, Chipped   Pulmonary neg shortness of breath, sleep apnea , former smoker,    Pulmonary exam normal breath sounds clear to auscultation       Cardiovascular Exercise Tolerance: Good hypertension, (-) angina+ DOE  (-) Past MI Normal cardiovascular exam+ pacemaker  Rhythm:regular Rate:Normal     Neuro/Psych PSYCHIATRIC DISORDERS Anxiety Depression  Neuromuscular disease    GI/Hepatic Neg liver ROS, hiatal hernia, Controlled,  Endo/Other  negative endocrine ROS  Renal/GU      Musculoskeletal  (+) Arthritis ,   Abdominal   Peds  Hematology negative hematology ROS (+)   Anesthesia Other Findings Past Medical History: No date: Anxiety No date: Arthritis No date: Bilateral carpal tunnel syndrome 2011: Cancer ([place])     Comment: Prostate Ca No date: Complication of anesthesia     Comment: urinary retention with Popanol No date: Depression No date: ED (erectile dysfunction) No date: History of blood product transfusion No date: History of hiatal hernia No date: History of kidney stones No date: Hyperlipemia No date: Hypertension No date: Neuropathy ([place]) No date: Obesity No date: Over weight No date: Presence of permanent cardiac pacemaker     Comment: [employer] No date: Prostatitis No date: PTSD (post-traumatic stress disorder) No date: Sleep apnea     Comment: OSA--use Bi-PAP No date: Urinary retention  Past Surgical History: No date: APPENDECTOMY 1988, 2006: BACK SURGERY     Comment: Lumbar Spine No date: BACK SURGERY     Comment: x2  Laminectomy No date: CARDIAC PACEMAKER PLACEMENT No date: CERVICAL FUSION 2008: CERVICAL SPINE SURGERY N/A No date: COLONOSCOPY 2004, 2005, 2014, 2015: JOINT REPLACEMENT     Comment: Bilat Hip Replacements 07/08/2014: SPINAL CORD STIMULATOR INSERTION N/A     Comment: Procedure: LUMBAR SPINAL CORD STIMULATOR               INSERTION;  Surgeon: Clydell Hakim, MD;                Location: [place];  Service: Neurosurgery;                Laterality: N/A;  LUMBAR SPINAL CORD STIMULATOR              INSERTION No date: TONSILLECTOMY     Reproductive/Obstetrics negative OB ROS                            Anesthesia Physical Anesthesia Plan  ASA: III  Anesthesia Plan: General LMA   Post-op Pain Management:    Induction:   Airway Management Planned:   Additional Equipment:   Intra-op Plan:   Post-operative Plan:   Informed Consent: I have reviewed the patients History and Physical, chart, labs and discussed the procedure including the risks, benefits and alternatives for the proposed anesthesia with the patient or authorized representative who has indicated his/her understanding and acceptance.   Dental Advisory Given  Plan Discussed with: Anesthesiologist, CRNA and Surgeon  Anesthesia Plan Comments: (Patient reports urinary retention with propofol but not with volatile anesthetics, plan to induce with etomidate)  Anesthesia Quick Evaluation  

## 2016-03-11 ENCOUNTER — Ambulatory Visit: Payer: Medicare Other | Attending: Pain Medicine | Admitting: Pain Medicine

## 2016-03-11 ENCOUNTER — Ambulatory Visit
Admission: RE | Admit: 2016-03-11 | Discharge: 2016-03-11 | Disposition: A | Discharge status: Home / Self Care | Payer: Medicare Other | Source: Ambulatory Visit | Attending: Pain Medicine | Admitting: Pain Medicine

## 2016-03-11 ENCOUNTER — Other Ambulatory Visit
Admission: RE | Admit: 2016-03-11 | Discharge: 2016-03-11 | Disposition: A | Discharge status: Home / Self Care | Payer: Medicare Other | Source: Ambulatory Visit | Attending: Pain Medicine | Admitting: Pain Medicine

## 2016-03-11 ENCOUNTER — Encounter: Payer: Self-pay | Admitting: Pain Medicine

## 2016-03-11 VITALS — BP 121/79 | HR 86 | Temp 98.4°F | Resp 20 | Ht 72.0 in | Wt 356.0 lb

## 2016-03-11 DIAGNOSIS — M47814 Spondylosis without myelopathy or radiculopathy, thoracic region: Secondary | ICD-10-CM | POA: Diagnosis not present

## 2016-03-11 DIAGNOSIS — G5790 Unspecified mononeuropathy of unspecified lower limb: Secondary | ICD-10-CM | POA: Diagnosis not present

## 2016-03-11 DIAGNOSIS — M1288 Other specific arthropathies, not elsewhere classified, other specified site: Secondary | ICD-10-CM

## 2016-03-11 DIAGNOSIS — M48061 Spinal stenosis, lumbar region without neurogenic claudication: Secondary | ICD-10-CM | POA: Diagnosis not present

## 2016-03-11 DIAGNOSIS — G5603 Carpal tunnel syndrome, bilateral upper limbs: Secondary | ICD-10-CM | POA: Diagnosis not present

## 2016-03-11 DIAGNOSIS — M5442 Lumbago with sciatica, left side: Secondary | ICD-10-CM

## 2016-03-11 DIAGNOSIS — M25551 Pain in right hip: Secondary | ICD-10-CM | POA: Insufficient documentation

## 2016-03-11 DIAGNOSIS — K589 Irritable bowel syndrome without diarrhea: Secondary | ICD-10-CM | POA: Insufficient documentation

## 2016-03-11 DIAGNOSIS — G8929 Other chronic pain: Secondary | ICD-10-CM

## 2016-03-11 DIAGNOSIS — Z96643 Presence of artificial hip joint, bilateral: Secondary | ICD-10-CM

## 2016-03-11 DIAGNOSIS — G5793 Unspecified mononeuropathy of bilateral lower limbs: Secondary | ICD-10-CM

## 2016-03-11 DIAGNOSIS — M5011 Cervical disc disorder with radiculopathy,  high cervical region: Secondary | ICD-10-CM | POA: Insufficient documentation

## 2016-03-11 DIAGNOSIS — F1390 Sedative, hypnotic, or anxiolytic use, unspecified, uncomplicated: Secondary | ICD-10-CM | POA: Diagnosis not present

## 2016-03-11 DIAGNOSIS — K219 Gastro-esophageal reflux disease without esophagitis: Secondary | ICD-10-CM | POA: Insufficient documentation

## 2016-03-11 DIAGNOSIS — M159 Polyosteoarthritis, unspecified: Secondary | ICD-10-CM

## 2016-03-11 DIAGNOSIS — M9983 Other biomechanical lesions of lumbar region: Secondary | ICD-10-CM

## 2016-03-11 DIAGNOSIS — M5441 Lumbago with sciatica, right side: Secondary | ICD-10-CM

## 2016-03-11 DIAGNOSIS — Z7982 Long term (current) use of aspirin: Secondary | ICD-10-CM | POA: Insufficient documentation

## 2016-03-11 DIAGNOSIS — G894 Chronic pain syndrome: Secondary | ICD-10-CM

## 2016-03-11 DIAGNOSIS — M150 Primary generalized (osteo)arthritis: Secondary | ICD-10-CM

## 2016-03-11 DIAGNOSIS — M546 Pain in thoracic spine: Secondary | ICD-10-CM | POA: Insufficient documentation

## 2016-03-11 DIAGNOSIS — Z79891 Long term (current) use of opiate analgesic: Secondary | ICD-10-CM | POA: Diagnosis not present

## 2016-03-11 DIAGNOSIS — M9982 Other biomechanical lesions of thoracic region: Secondary | ICD-10-CM

## 2016-03-11 DIAGNOSIS — M25552 Pain in left hip: Secondary | ICD-10-CM | POA: Insufficient documentation

## 2016-03-11 DIAGNOSIS — I10 Essential (primary) hypertension: Secondary | ICD-10-CM | POA: Insufficient documentation

## 2016-03-11 DIAGNOSIS — M4804 Spinal stenosis, thoracic region: Secondary | ICD-10-CM | POA: Diagnosis not present

## 2016-03-11 DIAGNOSIS — M545 Low back pain: Secondary | ICD-10-CM

## 2016-03-11 DIAGNOSIS — E669 Obesity, unspecified: Secondary | ICD-10-CM | POA: Insufficient documentation

## 2016-03-11 DIAGNOSIS — M25559 Pain in unspecified hip: Secondary | ICD-10-CM | POA: Diagnosis not present

## 2016-03-11 DIAGNOSIS — F4310 Post-traumatic stress disorder, unspecified: Secondary | ICD-10-CM | POA: Insufficient documentation

## 2016-03-11 DIAGNOSIS — M4807 Spinal stenosis, lumbosacral region: Secondary | ICD-10-CM | POA: Insufficient documentation

## 2016-03-11 DIAGNOSIS — Z79899 Other long term (current) drug therapy: Secondary | ICD-10-CM | POA: Diagnosis not present

## 2016-03-11 DIAGNOSIS — M25519 Pain in unspecified shoulder: Secondary | ICD-10-CM | POA: Diagnosis not present

## 2016-03-11 DIAGNOSIS — M47816 Spondylosis without myelopathy or radiculopathy, lumbar region: Secondary | ICD-10-CM | POA: Diagnosis not present

## 2016-03-11 DIAGNOSIS — F1190 Opioid use, unspecified, uncomplicated: Secondary | ICD-10-CM

## 2016-03-11 DIAGNOSIS — R209 Unspecified disturbances of skin sensation: Secondary | ICD-10-CM | POA: Diagnosis not present

## 2016-03-11 DIAGNOSIS — T84099A Other mechanical complication of unspecified internal joint prosthesis, initial encounter: Secondary | ICD-10-CM | POA: Insufficient documentation

## 2016-03-11 DIAGNOSIS — F329 Major depressive disorder, single episode, unspecified: Secondary | ICD-10-CM | POA: Insufficient documentation

## 2016-03-11 DIAGNOSIS — C61 Malignant neoplasm of prostate: Secondary | ICD-10-CM | POA: Diagnosis not present

## 2016-03-11 DIAGNOSIS — R339 Retention of urine, unspecified: Secondary | ICD-10-CM | POA: Diagnosis not present

## 2016-03-11 DIAGNOSIS — K449 Diaphragmatic hernia without obstruction or gangrene: Secondary | ICD-10-CM | POA: Insufficient documentation

## 2016-03-11 DIAGNOSIS — Z950 Presence of cardiac pacemaker: Secondary | ICD-10-CM | POA: Insufficient documentation

## 2016-03-11 DIAGNOSIS — Y792 Prosthetic and other implants, materials and accessory orthopedic devices associated with adverse incidents: Secondary | ICD-10-CM | POA: Diagnosis not present

## 2016-03-11 DIAGNOSIS — G4730 Sleep apnea, unspecified: Secondary | ICD-10-CM | POA: Insufficient documentation

## 2016-03-11 DIAGNOSIS — M47894 Other spondylosis, thoracic region: Secondary | ICD-10-CM

## 2016-03-11 DIAGNOSIS — E785 Hyperlipidemia, unspecified: Secondary | ICD-10-CM | POA: Diagnosis not present

## 2016-03-11 DIAGNOSIS — N529 Male erectile dysfunction, unspecified: Secondary | ICD-10-CM | POA: Diagnosis not present

## 2016-03-11 DIAGNOSIS — M25561 Pain in right knee: Secondary | ICD-10-CM | POA: Insufficient documentation

## 2016-03-11 DIAGNOSIS — S72112S Displaced fracture of greater trochanter of left femur, sequela: Secondary | ICD-10-CM | POA: Insufficient documentation

## 2016-03-11 DIAGNOSIS — M542 Cervicalgia: Secondary | ICD-10-CM

## 2016-03-11 DIAGNOSIS — Z87442 Personal history of urinary calculi: Secondary | ICD-10-CM | POA: Insufficient documentation

## 2016-03-11 DIAGNOSIS — Z9689 Presence of other specified functional implants: Secondary | ICD-10-CM

## 2016-03-11 LAB — VITAMIN B12: Vitamin B-12: 226 pg/mL (ref 180–914)

## 2016-03-11 LAB — MAGNESIUM: Magnesium: 1.7 mg/dL (ref 1.7–2.4)

## 2016-03-11 LAB — C-REACTIVE PROTEIN: CRP: 1.1 mg/dL — ABNORMAL HIGH (ref <1.0)

## 2016-03-11 LAB — SURGICAL PATHOLOGY

## 2016-03-11 LAB — SEDIMENTATION RATE: Sed Rate: 6 mm/hr (ref 0–20)

## 2016-03-11 MED ORDER — PREDNISONE 20 MG PO TABS: ORAL_TABLET | ORAL | 0 refills | Status: AC

## 2016-03-11 NOTE — Progress Notes (Addendum)
Safety precautions to be maintained throughout the outpatient stay will include: orient to surroundings, keep bed in low position, maintain call bell within reach at all times, provide assistance with transfer out of bed and ambulation. Met with [employer] rep Janie for adjustment to Spinal Cord Stimulator.

## 2016-03-11 NOTE — Patient Instructions (Addendum)
Gabapentin Titration  Medication used: Gabapentin (Generic Name) or Neurontin (Brand Name) 300 mg tablets/capsules  Reasons to stop increasing the dose:  Reason 1: You get good relief of symptoms, in which case there is no need to increase the daily dose any further.    Reason 2: You develop some side effects, such as sleeping all of the time, difficulty concentrating, or becoming disoriented, in which case you need to go down on the dose, to the prior level, where you were not experiencing any side effects. Stay on that dose longer, to allow more time for your body to get use it, before attempting to increase it again.   Reasons to stop increasing the dose: Reason 1: You get good relief of symptoms, in which case there is no need to increase the daily dose any further.  Reason 2: You develop some side effects, such as sleeping all of the time, difficulty concentrating, or becoming disoriented, in which case you need to go down on the dose, to the prior level, where you were not experiencing any side effects. Stay on that dose longer, to allow more time for your body to get use it, before attempting to increase it again.  Steps to increase medication: Step 1: Start by taking 1 (one) tablet at bedtime x 7 (seven) days.  Step 2: After 7 (seven) days of taking 1 (one) tablet at bedtime, increase it to 2 (two) tablets at bedtime. Stay on this dose x 7 (seven) days.  Step 3: After 7 (seven) days of taking 2 (two) tablets at bedtime, increase it to 3 (three) tablets at bedtime. Stay on this dose x another 7 (seven) days.  Step 4: After 7 (seven) days of taking 3 (three) tablet at bedtime, begin taking 1 (one) tablet at noon with lunch. Stay on this dose x another 7 (seven) days.  Step 5: After 7 (seven) days of taking 3 (three) tablet at bedtime, and 1 (one) tablet at noon, then begin taking 1 (one) tablet in the afternoon with dinner. Stay on this dose x another 7 (seven) days.  Step 6: After 7  (seven) days of taking 3 (three) tablet at bedtime, 1 (one) tablet at noon, and 1 (one) tablet in the afternoon, then begin taking 1 (one) tablet in the morning with breakfast. Stay on this dose x another 7 (seven) days. At this point you should be taking the medicine 4 (four) times a day, or about every 6 (six) hours. This daily regimen of taking the medicine 4 (four) times a day, will be maintained from now on. You should not take any doses any sooner than every 6 (six) hours.  Step 7: After 7 (seven) days of taking 3 (three) tablet at bedtime, 1 (one) tablet at noon, 1 (one) tablet in the afternoon, and 1 (one) tablet in the morning, begin taking 2 (two) tablets at noon with lunch. Stay on this dose x another 7 (seven) days.   Step 8: After 7 (seven) days of taking 3 (three) tablet at bedtime, 2 (two) tablets at noon, 1 (one) tablet in the afternoon, and 1 (one) tablet in the morning, begin taking 2 (two) tablets in the afternoon with dinner. Stay on this dose x another 7 (seven) days.   Step 9: After 7 (seven) days of taking 3 (three) tablet at bedtime, 2 (two) tablets at noon, 2 (two) tablets in the afternoon, and 1 (one) tablet in the morning, begin taking 2 (two) tablets in the morning  with breakfast. Stay on this dose x another 7 (seven) days. At this point you should be taking the medicine 4 (four) times a day, or about every 6 (six) hours. This daily regimen of taking the medicine 4 (four) times a day, will be maintained from now on. You should not take any doses any sooner than every 6 (six) hours.  Step 10: After 7 (seven) days of taking 3 (three) tablet at bedtime, 2 (two) tablets at noon, 2 (two) tablets in the afternoon, and 2 (two) tablets in the morning, begin taking 3 (three) tablets at noon with lunch. Stay on this dose x another 7 (seven) days.   Step 11: After 7 (seven) days of taking 3 (three) tablet at bedtime, 3 (three) tablets at noon, 2 (two) tablets in the afternoon, and 2 (two)  tablets in the morning, begin taking 3 (three) tablets in the afternoon with dinner. Stay on this dose x another 7 (seven) days.   Step 12: After 7 (seven) days of taking 3 (three) tablet at bedtime, 3 (three) tablets at noon, 3 (three) tablets in the afternoon, and 2 (two) tablet in the morning, begin taking 3 (three) tablets in the morning with breakfast. Stay on this dose x another 7 (seven) days. At this point you should be taking the medicine 4 (four) times a day, or about every 6 (six) hours. This daily regimen of taking the medicine 4 (four) times a day, will be maintained from now on.   Endpoint: Once you have reached the maximum dose you can tolerate without side-effects, contact your physician so as to evaluate the results of the regimen.   Questions: Feel free to contact us for any questions or problems at [phone]  Pain Score  Introduction: The pain score used by this practice is the Verbal Numerical Rating Scale (VNRS-11). This is an 11-point scale. It is for adults and children 10 years or older. There are significant differences in how the pain score is reported, used, and applied. Forget everything you learned in the past and learn this scoring system.  General Information: The scale should reflect your current level of pain. Unless you are specifically asked for the level of your worst pain, or your average pain. If you are asked for one of these two, then it should be understood that it is over the past 24 hours.  Basic Activities of Daily Living (ADL): Personal hygiene, dressing, eating, transferring, and using restroom.  Instructions: Most patients tend to report their level of pain as a combination of two factors, their physical pain and their psychosocial pain. This last one is also known as "suffering" and it is reflection of how physical pain affects you socially and psychologically. From now on, report them separately. From this point on, when asked to report your pain  level, report only your physical pain. Use the following table for reference.  Pain Clinic Pain Levels (0-5/10)  Pain Level Score Description  No Pain 0   Mild pain 1 Nagging, annoying, but does not interfere with basic activities of daily living (ADL). Patients are able to eat, bathe, get dressed, toileting (being able to get on and off the toilet and perform personal hygiene functions), transfer (move in and out of bed or a chair without assistance), and maintain continence (able to control bladder and bowel functions). Blood pressure and heart rate are unaffected. A normal heart rate for a healthy adult ranges from 60 to 100 bpm (beats per minute).  Mild to moderate pain 2 Noticeable and distracting. Impossible to hide from other people. More frequent flare-ups. Still possible to adapt and function close to normal. It can be very annoying and may have occasional stronger flare-ups. With discipline, patients may get used to it and adapt.   Moderate pain 3 Interferes significantly with activities of daily living (ADL). It becomes difficult to feed, bathe, get dressed, get on and off the toilet or to perform personal hygiene functions. Difficult to get in and out of bed or a chair without assistance. Very distracting. With effort, it can be ignored when deeply involved in activities.   Moderately severe pain 4 Impossible to ignore for more than a few minutes. With effort, patients may still be able to manage work or participate in some social activities. Very difficult to concentrate. Signs of autonomic nervous system discharge are evident: dilated pupils (mydriasis); mild sweating (diaphoresis); sleep interference. Heart rate becomes elevated (>115 bpm). Diastolic blood pressure (lower number) rises above 100 mmHg. Patients find relief in laying down and not moving.   Severe pain 5 Intense and extremely unpleasant. Associated with frowning face and frequent crying. Pain overwhelms the senses.   Ability to do any activity or maintain social relationships becomes significantly limited. Conversation becomes difficult. Pacing back and forth is common, as getting into a comfortable position is nearly impossible. Pain wakes you up from deep sleep. Physical signs will be obvious: pupillary dilation; increased sweating; goosebumps; brisk reflexes; cold, clammy hands and feet; nausea, vomiting or dry heaves; loss of appetite; significant sleep disturbance with inability to fall asleep or to remain asleep. When persistent, significant weight loss is observed due to the complete loss of appetite and sleep deprivation.  Blood pressure and heart rate becomes significantly elevated. Caution: If elevated blood pressure triggers a pounding headache associated with blurred vision, then the patient should immediately seek attention at an urgent or emergency care unit, as these may be signs of an impending stroke.    Emergency Department Pain Levels (6-10/10)  Emergency Room Pain 6 Severely limiting. Requires emergency care and should not be seen or managed at an outpatient pain management facility. Communication becomes difficult and requires great effort. Assistance to reach the emergency department may be required. Facial flushing and profuse sweating along with potentially dangerous increases in heart rate and blood pressure will be evident.   Distressing pain 7 Self-care is very difficult. Assistance is required to transport, or use restroom. Assistance to reach the emergency department will be required. Tasks requiring coordination, such as bathing and getting dressed become very difficult.   Disabling pain 8 Self-care is no longer possible. At this level, pain is disabling. The individual is unable to do even the most "basic" activities such as walking, eating, bathing, dressing, transferring to a bed, or toileting. Fine motor skills are lost. It is difficult to think clearly.   Incapacitating pain 9 Pain  becomes incapacitating. Thought processing is no longer possible. Difficult to remember your own name. Control of movement and coordination are lost.   The worst pain imaginable 10 At this level, most patients pass out from pain. When this level is reached, collapse of the autonomic nervous system occurs, leading to a sudden drop in blood pressure and heart rate. This in turn results in a temporary and dramatic drop in blood flow to the brain, leading to a loss of consciousness. Fainting is one of the body's self defense mechanisms. Passing out puts the brain in a calmed state  and causes it to shut down for a while, in order to begin the healing process.    Summary: 1. Refer to this scale when providing [place] with your pain level. 2. Be accurate and careful when reporting your pain level. This will help with your care. 3. Over-reporting your pain level will lead to loss of credibility. 4. Even a level of 1/10 means that there is pain and will be treated at our facility. 5. High, inaccurate reporting will be documented as "Symptom Exaggeration", leading to loss of credibility and suspicions of possible secondary gains such as obtaining more narcotics, or wanting to appear disabled, for fraudulent reasons. 6. Only pain levels of 5 or below will be seen at our facility. 7. Pain levels of 6 and above will be sent to the Emergency Department and the appointment cancelled.

## 2016-03-11 NOTE — Progress Notes (Signed)
Patient's Name: Richard Duncan  MRN: [id number]  Referring Provider: Anabel Bene, MD  DOB: 03/13/1950  PCP: Sofie Hartigan, MD  DOS: 03/11/2016  Note by: Kathlen Brunswick. Dossie Arbour, MD  Service setting: Ambulatory outpatient  Specialty: Interventional Pain Management  Location: [facility] (AMB) Pain Management Facility    Patient type: New Patient   Primary Reason(s) for Visit: Initial Patient Evaluation CC: Hip Pain; Back Pain; Neck Pain; and Shoulder Pain  HPI  [name] is a 66 y.o. year old, male patient, who comes today for an initial evaluation. 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[place]); Chronic pain syndrome; Long term current use of opiate analgesic; Long term prescription opiate use; Opiate use; Chronic prescription benzodiazepine use; Lower extremity neuropathy (Bilateral); Chronic hip pain (Location of Primary Source of Pain) (Bilateral) (R>L); History of bilateral total hip arthroplasty; Chronic low back pain (Location of Secondary source of pain) (Bilateral) (R>L); Chronic neck pain (Location of Tertiary source of pain) (Midline) (Bilateral); Disturbance of skin sensation; Lumbar facet arthropathy; Lumbar foraminal stenosis; Thoracic foraminal stenosis; Lumbar spondylosis; Osteoarthritis; Thoracic spondylosis; and [employer] spinal cord stimulator on his problem list.. His primarily concern today is the Hip Pain; Back Pain; Neck Pain; and Shoulder Pain  Pain Assessment: Self-Reported Pain Score: 8 /10 Clinically the patient looks like a 2/10 Reported level is inconsistent with clinical  observations. Information on the proper use of the pain scale provided to the patient today Pain Type: Chronic pain Pain Location: Hip (back, neck, shoulder) Pain Orientation: Lower, Right, Left Pain Descriptors / Indicators: Aching, Burning, Constant, Dull, Throbbing, Tender, Stabbing, Shooting, Sharp Pain Frequency: Constant  Onset and Duration: Gradual and Date of onset: 2001 Cause of pain: bone degeneration.  Hip replacements Severity: Getting worse, NAS-11 at its worse: 10/10, NAS-11 at its best: 10/10, NAS-11 now: 8/10 and NAS-11 on the average: 10/10 Timing: Not influenced by the time of the day and During activity or exercise Aggravating Factors: Bending, Climbing, Kneeling, Lifiting, Motion, Prolonged sitting, Prolonged standing, Squatting, Stooping , Twisting, Walking, Walking uphill, Walking downhill and Working Alleviating Factors: none Associated Problems: Erectile dysfunction, Fatigue, Nausea, Numbness, Sweating, Tingling and Weakness Quality of Pain: Aching, Agonizing, Annoying, Burning, Constant, Cruel, Disabling, Distressing, Dreadful, Dull, Exhausting, Feeling of constriction, Getting longer, Horrible, Nagging, Punishing, Sharp, Stabbing, Tender, Tingling and Uncomfortable Previous Examinations or Tests: Bone scan, CT scan, Ct-Myelogram, EMG/PNCV, MRI scan, Nerve block, X-rays, Nerve conduction test, Neurological evaluation, Orthoperdic evaluation and Psychiatric evaluation Previous Treatments: Epidural steroid injections, Facet blocks, Narcotic medications, Physical Therapy, Spinal cord stimulator and Stretching exercises  The patient comes into the clinics today for the first time for a chronic pain management evaluation. The patient indicates that his primary pain is that of the right hip, when compared to the left. He does have a history of a bilateral total hip arthroplasty 4. In addition, the patient indicates that his second worst pain is that of the lower back with the  right being worse than the left. Following this is his chronic neck pain that seems to be in the posterior midline with some radiation to both sides. He has pain over both shoulders but he denies one being worse than the other. He also  denies any cervicogenic headaches.  Today I took the time to provide the patient with information regarding my pain practice. The patient was informed that my practice is divided into two sections: an interventional pain management section, as well as a completely separate and distinct medication management section. The interventional portion of my practice takes place on Tuesdays and Thursdays, while the medication management is conducted on Mondays and Wednesdays. Because of the amount of documentation required on both them, they are kept separated. This means that there is the possibility that the patient may be scheduled for a procedure on Tuesday, while also having a medication management appointment on Wednesday. I have also informed the patient that because of current staffing and facility limitations, I no longer take patients for medication management only. To illustrate the reasons for this, I gave the patient the example of a surgeon and how inappropriate it would be to refer a patient to his/her practice so that they write for the post-procedure antibiotics on a surgery done by someone else.   The patient was informed that joining my practice means that they are open to any and all interventional therapies. I clarified for the patient that this does not mean that they will be forced to have any procedures done. What it means is that patients looking for a practitioner to simply write for their pain medications and not take advantage of other interventional techniques will be better served by a different practitioner, other than myself. I made it clear that I prefer to spend my time providing those services that I specialize in.  The patient was also made aware of my  Comprehensive Pain Management Safety Guidelines where by joining my practice, they limit all of their nerve blocks and joint injections to those done by our practice, for as long as we are retained to manage their care.   Historic Controlled Substance Pharmacotherapy Review  PMP and historical list of controlled substances: Valium 10 mg 3 times a day Medications: The patient did not bring the medication(s) to the appointment, as requested in our "New Patient Package" Pharmacodynamics: N/A Undesirable effects: Side-effects or Adverse reactions: None reported Historical Monitoring: The patient  reports that he does not use drugs.. No results found for: MDMA, COCAINSCRNUR, PCPSCRNUR, THCU, ETH Historical Background Evaluation: [place] PDMP: Online review of the past 29-monthperiod conducted. No abnormal patterns identified.       [place] Department of public safety, offender search: (Editor, commissioningInformation) Non-contributory Risk Assessment Profile: Aberrant behavior: None observed or detected today Risk factors for fatal opioid overdose: None identified today Fatal overdose hazard ratio (HR): Calculation deferred Non-fatal overdose hazard ratio (HR): Calculation deferred Risk of opioid abuse or dependence: 0.7-3.0% with doses ? 36 MME/day and 6.1-26% with doses ? 120 MME/day. Substance use disorder (SUD) risk level: Pending results of Medical Psychology Evaluation for SUD Opioid risk tool (ORT) (Total Score): 4  ORT Scoring interpretation table:  Score <3 = Low Risk for SUD  Score between 4-7 = Moderate Risk for SUD  Score >8 = High Risk for Opioid Abuse   PHQ-2 Depression Scale:  Total score: 0  PHQ-2 Scoring interpretation table: (Score and probability of major depressive disorder)  Score 0 = No depression  Score 1 = 15.4% Probability  Score 2 = 21.1% Probability  Score 3 = 38.4% Probability  Score 4 = 45.5% Probability  Score 5 = 56.4% Probability  Score 6 = 78.6% Probability   PHQ-9  Depression Scale:  Total score: 0  PHQ-9 Scoring interpretation table:  Score 0-4 = No depression  Score 5-9 = Mild depression  Score 10-14 = Moderate depression  Score 15-19 = Moderately severe depression  Score 20-27 = Severe depression (2.4 times higher risk of SUD and 2.89 times higher risk of overuse)   Pharmacologic Plan: Pending ordered tests and/or consults  Meds  The patient has a current medication list which includes the following prescription(s): amlodipine, aspirin ec, benazepril, vitamin d3, citalopram, doxycycline, gabapentin, hydralazine, lamotrigine, pantoprazole, prednisone, and sildenafil.  Current Outpatient Prescriptions on File Prior to Visit  Medication Sig  . amLODipine (NORVASC) 10 MG tablet Take 10 mg by mouth daily.   Marland Kitchen aspirin EC 81 MG tablet Take 81 mg by mouth daily.   . benazepril (LOTENSIN) 20 MG tablet Take 20 mg by mouth daily.   . Cholecalciferol (VITAMIN D3) 2000 UNITS capsule Take 2,000 Units by mouth daily.   . citalopram (CELEXA) 40 MG tablet Take 40 mg by mouth at bedtime.   . gabapentin (NEURONTIN) 300 MG capsule Take 900 mg by mouth at bedtime.   . hydrALAZINE (APRESOLINE) 25 MG tablet Take 25 mg by mouth 2 (two) times daily.  . sildenafil (VIAGRA) 100 MG tablet Take 1 tablet (100 mg total) by mouth daily as needed for erectile dysfunction. Reported on 05/18/2015   No current facility-administered medications on file prior to visit.    Imaging Review  Cervical Imaging: Cervical MR wo contrast:  Results for orders placed in visit on 01/12/08  MR C Spine Ltd W/O Cm   Narrative * PRIOR REPORT IMPORTED FROM AN EXTERNAL SYSTEM *   PRIOR REPORT IMPORTED FROM THE SYNGO [place] EXAM:    neck arm pain  COMMENTS:   PROCEDURE:     MMR - MMR CERVICAL SPINE WO CONT  - Jan 12 2008 12:27PM   RESULT:     Non-gadolinium MR imaging of the cervical spine is performed  utilizing sagittal and axial T1 and T2-weighted sequences. There  is no  previous exam for comparison.   Degenerative disc space narrowing and hypertrophic marginal osteophyte  formation is seen at the C3-C4 and C5-C6 levels. A diffuse or focal disc  herniation does not appear to be definitely identified. There is moderate  spinal canal stenosis at C3-C4 and C5-C6 without abnormal spinal cord  signal  or cord deformity. Bilateral foraminal narrowing is present at C5-C6. No  significant foraminal narrowing is present otherwise. Degenerative  endplate  signal changes are seen at C5-C6 and C6-C7. There is some mild to moderate  disc space narrowing at C6-C7.   IMPRESSION:      Areas of spinal canal and neural foraminal narrowing as  described with foraminal narrowing bilaterally at C5-C6 secondary to  degenerative disc disease and spinal canal narrowing at C3-C4 and C5-C6 to  the greatest extent. Clinical correlation is recommended for distribution  and severity of symptoms.   Thank you for the opportunity to contribute to the care of your patient.       Thoracic Imaging: Thoracic CT w/wo contrast:  Results for orders placed in visit on 03/11/14  CT T Spine Ltd Wo Or W/ Cm   Narrative * PRIOR REPORT IMPORTED FROM AN EXTERNAL SYSTEM *   CLINICAL DATA:  Chronic thoracic spine pain.   EXAM:  CT THORACIC SPINE WITHOUT CONTRAST   TECHNIQUE:  Multidetector CT imaging of the thoracic spine was performed without  intravenous contrast administration. Multiplanar CT image  reconstructions were also generated.   COMPARISON:  None.   FINDINGS:  There is a slight thoracic scoliosis with convexity to the right.   Solid interbody fusion at C6-7. Slight disc space narrowing and  anterior osteophyte formation at C7-T1. No neural impingement.  Slight left facet arthritis.   T1-2 through T3-4:  Normal.   T4-5: Calcification of the left ligamentum flavum. Slight narrowing  of the left lateral recess with slight left facet arthritis. The  disc is  normal.   T5-6: Moderate right facet arthritis. Minimal osteophytes from the  inferior endplate of T5 in the right lateral recess.   T6-7: Small Schmorl's node in the superior plate of T7. Small mobile  broad-based endplate osteophytes without neural impingement. Slight  bilateral facet arthritis. Anterior osteophytes appear to fuse this  level.   T7-8: Normal disc. Moderately severe left facet arthritis with  moderate left foraminal stenosis. Slight right facet arthritis.  Anterior osteophytes fuse this level.   T8-9: Normal disc. Moderately severe bilateral facet arthritis.  Anterior osteophytes fuse this level.   T9-10: Normal discs. Anterior osteophytes fuse this level. Severe  right and slight left facet arthritis. Fairly severe right foraminal  stenosis.   T10-11: Disc degeneration. No disc protrusion. Severe right and  moderate left facet arthritis. Severe right foraminal stenosis.   T11-12:  Normal disc.  Anterior osteophytes fuse this level.   T12-L1: Small Schmorl's node in the superior endplate of L1. No disc  protrusion. Anterior osteophytes fuse this level.   IMPRESSION:  1. Diffuse degenerative changes in the thoracic spine. Severe right  foraminal stenosis at T9-10 and T10-11.  2. Multilevel facet arthritis in the thoracic spine without neural  impingement.    Electronically Signed    By: Lorriane Shire M.D.    On: 03/11/2014 17:05       Lumbosacral Imaging: Lumbar MR wo contrast:  Results for orders placed in visit on 08/25/09  [place] W/O Cm   Narrative * PRIOR REPORT IMPORTED FROM AN EXTERNAL SYSTEM *   PRIOR REPORT IMPORTED FROM THE SYNGO [place] EXAM:    low back pain  w radiculopathy  COMMENTS:   PROCEDURE:     MMR - MMR LUMBAR SPINE WO CONTRAST  - Aug 25 2009  1:24PM   RESULT:     Non gadolinium MR imaging of the lumbar spine is performed in  the standard fashion and compared to the previous examination dated   12/28/2007.   As demonstrated on the previous exam, there is severe spinal stenosis at  the  L3-L4 level secondary to disc herniation, facet hypertrophy and  hypertrophy  of the ligamentum flavum. Surgical consultation is again recommended.  There  does not appear to be any improvement since the previous study. There is  mild to moderate left-sided foraminal narrowing at the L3-L4 and L2-L3  levels with mild to moderate right-sided L3-L4 foraminal narrowing and  mild  L2-L3 right-sided foraminal narrowing. The vertebral body heights and  intervertebral disc spaces are maintained. There does appear to be some  slight increased signal on the fat-suppressed T2 sagittal images involving  the L3 vertebral body posterior mid to upper portion. There is decreased  signal in this area on the T1-weighted sagittal sequence images which is  new  compared to the previous study and could represent a Schmorl's node. Modic  endplate degenerative changes may be present as well. There does not  appear  to  be definite evidence of discitis. Multilevel hypertrophic endplate  spurring is seen in the thoracolumbar region. No significant sacral canal  narrowing is seen. The conus medullaris terminates at the L1 mid to lower  level.   IMPRESSION:   There are again changes of degenerative disc disease with disc herniation  and spinal canal as well as neural foraminal stenosis at the L3-L4 level  and  to a lesser extent at L2-L3. Other changes as listed above. Surgical  consultation is recommended.   Thank you for the opportunity to contribute to the care of your patient.       Lumbar MR w/wo contrast:  Results for orders placed in visit on 12/28/07  MR Lumbar Spine W Wo Contrast   Narrative * PRIOR REPORT IMPORTED FROM AN EXTERNAL SYSTEM *   PRIOR REPORT IMPORTED FROM THE SYN[facility] [place] FOR EXAM:    back pain herniated disc  dr request WWO  COMMENTS:  [phone]   PROCEDURE:      MMR - MMR LUMBAR SPINE WO/W  - Dec 28 2007 10:54AM   RESULT:     Pre- and postgadolinium MR imaging of the lumbar spine is  performed. There is no previous study for comparison. The patient has  undergone a laminectomy in the past.   The examination demonstrates disc protrusion secondary to herniation at  the  L3-L4 level with moderate to severe spinal stenosis at this level. There  is  almost complete obliteration of the CSF signal. There is also facet  hypertrophy and ligamental hypertrophy contributing to the spinal  stenosis.  There does not appear to be extensive extrusion of disc material from this  level. There is foraminal narrowing bilaterally at L3-L4 that appears to  be  mild to moderate. There is some mild to moderate foraminal stenosis at  L2-L3  greater on the LEFT. No marrow edema is appreciated. There is no abnormal  enhancement following intravenous administration of gadolinium based  contrast. The conus medullaris appears to terminate at the L1 level. There  is mild to moderate spinal canal narrowing at the L2-L3 level secondary to  diffuse disc bulge as well as facet hypertrophy and hypertrophy of the  ligamentum flavum. The AP dimension of the thecal sac at this level  appears  to be approximately 9 mm in the midline anterior to posterior. At other  levels, the spinal canal is patent. No other areas of significant  foraminal  impingement are appreciated. There is some diffuse foraminal narrowing.   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 There is some foraminal impingement at this  level and at L2-L3 as described. There is mild to moderate spinal canal  stenosis at the L2-L3 level.   Thank you for the opportunity to contribute to the care of your patient.       Lumbar CT wo contrast:  Results for orders placed in visit on 03/11/14  CT Lumbar Spine Wo Contrast   Narrative *  PRIOR REPORT IMPORTED FROM AN EXTERNAL SYSTEM *   CLINICAL DATA:  Chronic thoracic and lumbar pain.  Left sciatica.   EXAM:  CT LUMBAR SPINE WITHOUT CONTRAST   TECHNIQUE:  Multidetector CT imaging of the lumbar spine was performed without  intravenous contrast administration. Multiplanar CT image  reconstructions were also generated.   COMPARISON:  CT scans of the abdomen dated 02/11/2014 and 09/24/2010   FINDINGS:  T12-L1: Moderately  severe due to degenerative disc disease with disc  space narrowing with a small broad-based disc bulge with  accompanying osteophytes. No neural impingement. Slight right facet  arthritis.   L1-2: 3 mm retrolisthesis with disc space narrowing. Broad-based  disc bulge with accompanying osteophytes which should create  moderate spinal stenosis. No focal neural impingement. Moderate  right foraminal stenosis.   L2-3: Severe degenerative disc disease with a prominent Schmorl's  node in the superior endplate of L3. 2 mm retrolisthesis. Small  broad-based disc bulge with accompanying osteophytes asymmetric to  the left with moderate bilateral foraminal stenosis and moderate  spinal stenosis. The patient has a disc protrusion into the left  lateral recess which could affect the left L3 nerve. However, this  appears unchanged since 2012.   L3-4: Broad-based disc bulge with small central protrusion with  accompanying osteophytes. Moderate spinal stenosis. The patient has  had previous resection of the L3 spinous process but the spinal  canal is not decompressed. However, this appears unchanged since  2012. Moderately severe bilateral foraminal stenosis, left worse  than right.   L4-5: Lucency in the posterior aspect of L4 is felt to represent  prominent but normal vessels or possibly a small hemangioma but  appears unchanged since 2012. There is a small broad-based disc  bulge. The canal is narrowed but unchanged since 2012. There is  severe right and  moderately severe left facet arthritis, also  unchanged. Moderate bilateral foraminal stenosis.   L5-S1: Slight degenerative disc disease. No disc protrusion or  bulging. No neural impingement. Slight degenerative changes of the  right facet joint, unchanged since 2012.   IMPRESSION:  Multilevel degenerative disc and joint disease, essentially  unchanged since the prior CT scan of 2012. The spinal canal is  narrowed at L2-3 and L3-4 and L4-5, unchanged. Multilevel foraminal  stenosis as described.    Electronically Signed    By: Lorriane Shire M.D.    On: 03/11/2014 16:55       Lumbar DG 1V:  Results for orders placed during the hospital encounter of 07/08/14  DG Lumbar Spine 1 View   Narrative CLINICAL DATA:  Elective surgery  EXAM: LUMBAR SPINE - 1 VIEW; DG C-ARM 61-120 MIN  COMPARISON:  None.  FINDINGS: Single intraoperative spot image over the lower thoracic and upper lumbar spine demonstrate spinal stimulator wires in place projecting over the T8 vertebral body.  IMPRESSION: Spinal stimulator wires as above.   Electronically Signed   By: Rolm Baptise M.D.   On: 07/08/2014 13:00    Knee Imaging: Knee-R DG 4 views:  Results for orders placed in visit on 07/17/05  DG Knee Complete 4 Views Right   Narrative * PRIOR REPORT IMPORTED FROM AN EXTERNAL SYSTEM *   PRIOR REPORT IMPORTED FROM THE [facility] WORKFLOW SYSTEM   REASON FOR EXAM:  RIGHT  knee pain fax [phone]  COMMENTS:   PROCEDURE:     DXR - DXR KNEE RT COMP WITH OBLIQUES  - Jul 17 2005  9:43AM   RESULT:          Five views of the RIGHT knee were obtained.   No fracture, dislocation, or other acute bony abnormality is seen.  The  knee  joint space is well maintained.  The patella is intact.  In the AP view,  there is observed very slight hypertrophic spurring at the knee medially  and  laterally.   IMPRESSION:   1.     No fracture or  other acute change is identified.  2.     The knee joint space  appears well maintained.  3.     There is slight hypertrophic spurring at the knee medially and  laterally.   Thank you for this opportunity to contribute to the care of your patient.       Note: Available results from prior imaging studies were reviewed.        ROS  Cardiovascular History: Abnormal heart rhythm, Daily Aspirin intake, Hypertension, Pacemaker or defibrillator and Needs antibiotics prior to dental procedures Pulmonary or Respiratory History: Bronchitis and Sleep apnea Neurological History: Scoliosis Review of Past Neurological Studies: No results found for this or any previous visit. Psychological-Psychiatric History: Anxiety Gastrointestinal History: Hiatal hernia, Reflux or heatburn and Irritable Bowel Syndrome (IBS) Genitourinary History: Nephrolithiasis and Recurrent Urinary Tract infections Hematological History: Negative for anticoagulant therapy, anemia, bruising or bleeding easily, hemophilia, sickle cell disease or trait, thrombocytopenia or coagulupathies Endocrine History: Negative for diabetes or thyroid disease Rheumatologic History: Osteoarthritis and Fibromyalgia Musculoskeletal History: Negative for myasthenia gravis, muscular dystrophy, multiple sclerosis or malignant hyperthermia Work History: Disabled  Allergies  Mr. Macqueen is allergic to propofol and propranolol.  Laboratory Chemistry  Inflammation Markers Lab Results  Component Value Date   ESRSEDRATE 8 07/14/2013   Renal Function Lab Results  Component Value Date   BUN 16 02/27/2016   CREATININE 1.08 02/27/2016   GFRAA >60 02/27/2016   GFRNONAA >60 02/27/2016   Hepatic Function Lab Results  Component Value Date   AST 20 02/27/2016   ALT 20 02/27/2016   ALBUMIN 4.0 02/27/2016   Electrolytes Lab Results  Component Value Date   NA 141 02/27/2016   K 3.9 02/27/2016   CL 108 02/27/2016   CALCIUM 9.0 02/27/2016   Pain Modulating Vitamins No results found for: Maralyn Sago  [id number], [id number], [id number], 25OHVITD1, 25OHVITD2, 25OHVITD3, VITAMINB12 Coagulation Parameters Lab Results  Component Value Date   INR 1.0 07/14/2013   LABPROT 13.0 07/14/2013   APTT 31.8 07/14/2013   PLT 202 02/27/2016   Cardiovascular Lab Results  Component Value Date   HGB 16.1 02/27/2016   HCT 47.6 02/27/2016   Note: Lab results reviewed.  [place]  Drug: Mr. Rathbun  reports that he does not use drugs. Alcohol:  reports that he drinks alcohol. Tobacco:  reports that he quit smoking about 49 years ago. His smoking use included Cigarettes. He quit after 2.00 years of use. He has never used smokeless tobacco. Medical:  has a past medical history of Anxiety; Arthritis; Bilateral carpal tunnel syndrome; Cancer ([place]) (2011); Complication of anesthesia; Depression; Difficult intubation; ED (erectile dysfunction); History of blood product transfusion; History of hiatal hernia; History of kidney stones; Hyperlipemia; Hypertension; Neuropathy ([place]); Obesity; Over weight; Presence of permanent cardiac pacemaker; Prostatitis; PTSD (post-traumatic stress disorder); Sleep apnea; and Urinary retention. Family: family history includes Benign prostatic hyperplasia in his father; Hypertension in his mother; Prostate cancer in his father.  Past Surgical History:  Procedure Laterality Date  . APPENDECTOMY    . [place], 2006   Lumbar Spine  . BACK SURGERY     x2 Laminectomy  . CARDIAC PACEMAKER PLACEMENT    . CERVICAL FUSION    . CERVICAL SPINE SURGERY N/A 2008  . COLONOSCOPY    . EXCISION OF ABDOMINAL WALL TUMOR N/A 03/08/2016   Procedure: EXCISION OF ABDOMINAL WALL MASS;  Surgeon: Leonie Green, MD;  Location: [facility];  Service: General;  Laterality: N/A;  . JOINT  REPLACEMENT  2004, 2005, 2014, 2015   Bilat Hip Replacements  . SPINAL CORD STIMULATOR INSERTION N/A 07/08/2014   Procedure: LUMBAR SPINAL CORD STIMULATOR INSERTION;  Surgeon: Clydell Hakim, MD;  Location: [place];   Service: Neurosurgery;  Laterality: N/A;  LUMBAR SPINAL CORD STIMULATOR INSERTION  . TONSILLECTOMY     Active Ambulatory Problems    Diagnosis Date Noted  . Anxiety 06/18/2014  . 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 Mechanical complication of prosthetic joint implant ([place]) 07/12/2013  . Cardiac disease 06/18/2014  . HLD (hyperlipidemia) 06/18/2014  . BP (high blood pressure) 06/18/2014  . CA of prostate ([place]) 06/18/2014  . ED (erectile dysfunction) 06/18/2014  . Prostatitis 06/18/2014  . Obesity 06/18/2014  . Urinary retention 06/18/2014  . Prostate cancer ([place]) 09/13/2014  . 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 Lower urinary tract symptoms 09/13/2014  . Acquired heart block 12/07/2015  . Acute thoracic back pain 12/12/2015  . 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 Chronic pain syndrome 03/11/2016  . Long term current use of opiate analgesic 03/11/2016  . Long term prescription opiate use 03/11/2016  . Opiate use 03/11/2016  . Chronic prescription benzodiazepine use 03/11/2016  . Lower extremity neuropathy (Bilateral) 03/11/2016  . Chronic hip pain (Location of Primary Source of Pain) (Bilateral) (R>L) 03/11/2016  . 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 Chronic low back pain (Location of Secondary source of pain) (Bilateral) (R>L) 03/11/2016  . Chronic neck pain (Location of Tertiary source of pain) (Midline) (Bilateral) 03/11/2016  . Disturbance of skin sensation 03/11/2016  . Lumbar facet arthropathy 03/11/2016  . Lumbar foraminal stenosis 03/11/2016  . Thoracic foraminal stenosis 03/11/2016  . Lumbar spondylosis 03/11/2016  . Osteoarthritis 03/11/2016  . Thoracic spondylosis 03/11/2016  . Medtronic spinal cord stimulator 03/11/2016   Resolved Ambulatory Problems    Diagnosis Date Noted  . No Resolved Ambulatory Problems   Past Medical History:  Diagnosis Date  . Anxiety   . Arthritis   . Bilateral carpal  tunnel syndrome   . Cancer ([place]) 2011  . Complication of anesthesia   . Depression   . Difficult intubation   . ED (erectile dysfunction)   . History of blood product transfusion   . History of hiatal hernia   . History of kidney stones   . Hyperlipemia   . Hypertension   . Neuropathy ([place])   . Obesity   . Over weight   . Presence of permanent cardiac pacemaker   . Prostatitis   . PTSD (post-traumatic stress disorder)   . Sleep apnea   . Urinary retention    Constitutional Exam  General appearance: Well nourished, well developed, and well hydrated. In no apparent acute distress Vitals:   03/11/16 1136  BP: 121/79  Pulse: 86  Resp: 20  Temp: 98.4 F (36.9 C)  SpO2: 98%  Weight: (!) 356 lb (161.5 kg)  Height: 6' (1.829 m)   BMI Assessment: Estimated body mass index is 48.28 kg/m as calculated from the following:   Height as of this encounter: 6' (1.829 m).   Weight as of this encounter: 356 lb (161.5 kg).  BMI interpretation table: BMI level Category Range association with higher incidence of chronic pain  <18 kg/m2 Underweight   18.5-24.9 kg/m2 Ideal body weight   25-29.9 kg/m2 Overweight Increased incidence by 20%  30-34.9 kg/m2 Obese (Class I) Increased incidence by 68%  35-39.9 kg/m2 Severe obesity (Class II) Increased incidence by 136%  >  40 kg/m2 Extreme obesity (Class III) Increased incidence by 254%   BMI Readings from Last 4 Encounters:  03/11/16 48.28 kg/m  02/27/16 48.28 kg/m  11/20/15 49.30 kg/m  05/18/15 49.38 kg/m   Wt Readings from Last 4 Encounters:  03/11/16 (!) 356 lb (161.5 kg)  02/27/16 (!) 356 lb (161.5 kg)  11/20/15 (!) 363 lb 8 oz (164.9 kg)  05/18/15 (!) 364 lb 1.6 oz (165.2 kg)  Psych/Mental status: Alert, oriented x 3 (person, place, & time)       Eyes: PERLA Respiratory: No evidence of acute respiratory distress  Cervical Spine Exam  Inspection: No masses, redness, or swelling Alignment: Symmetrical Functional ROM:  Unrestricted ROM Stability: No instability detected Muscle strength & Tone: Functionally intact Sensory: Unimpaired Palpation: Non-contributory  Upper Extremity (UE) Exam    Side: Right upper extremity  Side: Left upper extremity  Inspection: No masses, redness, swelling, or asymmetry. No contractures  Inspection: No masses, redness, swelling, or asymmetry. No contractures  Functional ROM: Unrestricted ROM          Functional ROM: Unrestricted ROM          Muscle strength & Tone: Functionally intact  Muscle strength & Tone: Functionally intact  Sensory: Unimpaired  Sensory: Unimpaired  Palpation: Euthermic  Palpation: Euthermic  Specialized Test(s): Deferred         Specialized Test(s): Deferred          Thoracic Spine Exam  Inspection: No masses, redness, or swelling Alignment: Symmetrical Functional ROM: Unrestricted ROM Stability: No instability detected Sensory: Unimpaired Muscle strength & Tone: Functionally intact Palpation: Non-contributory  Lumbar Spine Exam  Inspection: No masses, redness, or swelling Alignment: Symmetrical Functional ROM: Unrestricted ROM Stability: No instability detected Muscle strength & Tone: Functionally intact Sensory: Unimpaired Palpation: Non-contributory Provocative Tests: Lumbar Hyperextension and rotation test: evaluation deferred today       Patrick's Maneuver: evaluation deferred today              Gait & Posture Assessment  Ambulation: Unassisted Gait: Relatively normal for age and body habitus Posture: WNL   Lower Extremity Exam    Side: Right lower extremity  Side: Left lower extremity  Inspection: No masses, redness, swelling, or asymmetry. No contractures  Inspection: No masses, redness, swelling, or asymmetry. No contractures  Functional ROM: Unrestricted ROM          Functional ROM: Unrestricted ROM          Muscle strength & Tone: Functionally intact  Muscle strength & Tone: Functionally intact  Sensory: Unimpaired   Sensory: Unimpaired  Palpation: No palpable anomalies  Palpation: No palpable anomalies   Assessment  Primary Diagnosis & Pertinent Problem List: The primary encounter diagnosis was Chronic pain syndrome. Diagnoses of Long term current use of opiate analgesic, Long term prescription opiate use, Opiate use, Chronic prescription benzodiazepine use, Lower extremity neuropathy (Bilateral), Chronic hip pain (Location of Primary Source of Pain) (Bilateral) (R>L), History of bilateral total hip arthroplasty, Chronic low back pain (Location of Secondary source of pain) (Bilateral) (R>L), Chronic neck pain (Location of Tertiary source of pain) (Midline) (Bilateral), Disturbance of skin sensation, Lumbar facet arthropathy, Lumbar foraminal stenosis, Thoracic foraminal stenosis, Lumbar spondylosis, Osteoarthritis, Thoracic spondylosis, and Medtronic spinal cord stimulator were also pertinent to this visit.  Visit Diagnosis: 1. Chronic pain syndrome   2. Long term current use of opiate analgesic   3. Long term prescription opiate use   4. Opiate use   5. Chronic prescription benzodiazepine use  6. Lower extremity neuropathy (Bilateral)   7. Chronic hip pain (Location of Primary Source of Pain) (Bilateral) (R>L)   8. History of bilateral total hip arthroplasty   9. Chronic low back pain (Location of Secondary source of pain) (Bilateral) (R>L)   10. Chronic neck pain (Location of Tertiary source of pain) (Midline) (Bilateral)   11. Disturbance of skin sensation   12. Lumbar facet arthropathy   13. Lumbar foraminal stenosis   14. Thoracic foraminal stenosis   15. Lumbar spondylosis   16. Osteoarthritis   17. Thoracic spondylosis   18. Medtronic spinal cord stimulator    Plan of Care  Initial treatment plan:  Please be advised that as per protocol, today's visit has been an evaluation only. We have not taken over the patient's controlled substance management.  Problem-specific plan: No  problem-specific Assessment & Plan notes found for this encounter.  Ordered Lab-work, Procedure(s), Referral(s), & Consult(s): Orders Placed This Encounter  Procedures  . DG HIP UNILAT W OR W/O PELVIS 2-3 VIEWS LEFT  . DG HIP UNILAT W OR W/O PELVIS 2-3 VIEWS RIGHT  . CT LUMBAR SPINE WO CONTRAST  . CT THORACIC SPINE WO CONTRAST  . Compliance Drug Analysis, Ur  . C-reactive protein  . Magnesium  . Sedimentation rate  . Vitamin B12  . 25-Hydroxyvitamin D Lcms D2+D3  . NCV with EMG(electromyography)   Pharmacotherapy: Medications ordered:  Meds ordered this encounter  Medications  . predniSONE (DELTASONE) 20 MG tablet    Sig: Take 3 tab(s) in the morning x 3 days, then 2 tab(s) x 3 days, followed by 1 tab x 3 days.    Dispense:  21 tablet    Refill:  0    Do not add to the "Automatic Refill" notification system.   Medications administered during this visit: Mr. Schroepfer had no medications administered during this visit.   Pharmacotherapy under consideration:  Opioid Analgesics: The patient was informed that there is no guarantee that he would be a candidate for opioid analgesics. The decision will be made following CDC guidelines. This decision will be based on the results of diagnostic studies, as well as Mr. Vallie's risk profile.  Membrane stabilizer: To be determined at a later time Muscle relaxant: To be determined at a later time NSAID: To be determined at a later time Other analgesic(s): To be determined at a later time   Interventional therapies under consideration: Mr. Molesworth was informed that there is no guarantee that he would be a candidate for interventional therapies. The decision will be based on the results of diagnostic studies, as well as Mr. Fagerstrom's risk profile.  Possible procedure(s): Thoracic epidural steroid injection  Lumbar epidural steroid injection  Transforaminal thoracic epidural steroid injection  Transforaminal lumbar epidural steroid injection   Thoracic facet blocks  Lumbar facet blocks  Lumbar sympathetic block    Provider-requested follow-up: Return in about 2 weeks (around 03/25/2016) for test result(s).  Future Appointments Date Time Provider [place]  05/24/2016 9:00 AM Nori Riis, PA-C [id number] None    Primary Care Physician: Sofie Hartigan, MD Location: [facility] Note by: Kathlen Brunswick. Dossie Arbour, M.D, DABA, DABAPM, DABPM, DABIPP, FIPP Date: 03/11/2016; Time: 2:30 PM  Pain Score Disclaimer: We use the NRS-11 scale. This is a self-reported, subjective measurement of pain severity with only modest accuracy. It is used primarily to identify changes within a particular patient. It must be understood that outpatient pain scales are significantly less accurate that those  used for research, where they can be applied under ideal controlled circumstances with minimal exposure to variables. In reality, the score is likely to be a combination of pain intensity and pain affect, where pain affect describes the degree of emotional arousal or changes in action readiness caused by the sensory experience of pain. Factors such as social and work situation, setting, emotional state, anxiety levels, expectation, and prior pain experience may influence pain perception and show large inter-individual differences that may also be affected by time variables.  Patient instructions provided during this appointment: Patient Instructions   Gabapentin Titration  Medication used: Gabapentin (Generic Name) or Neurontin (Brand Name) 300 mg tablets/capsules  Reasons to stop increasing the dose:  Reason 1: You get good relief of symptoms, in which case there is no need to increase the daily dose any further.    Reason 2: You develop some side effects, such as sleeping all of the time, difficulty concentrating, or becoming disoriented, in which case you need to go down on the dose, to the prior level, where you  were not experiencing any side effects. Stay on that dose longer, to allow more time for your body to get use it, before attempting to increase it again.   Reasons to stop increasing the dose: Reason 1: You get good relief of symptoms, in which case there is no need to increase the daily dose any further.  Reason 2: You develop some side effects, such as sleeping all of the time, difficulty concentrating, or becoming disoriented, in which case you need to go down on the dose, to the prior level, where you were not experiencing any side effects. Stay on that dose longer, to allow more time for your body to get use it, before attempting to increase it again.  Steps to increase medication: Step 1: Start by taking 1 (one) tablet at bedtime x 7 (seven) days.  Step 2: After 7 (seven) days of taking 1 (one) tablet at bedtime, increase it to 2 (two) tablets at bedtime. Stay on this dose x 7 (seven) days.  Step 3: After 7 (seven) days of taking 2 (two) tablets at bedtime, increase it to 3 (three) tablets at bedtime. Stay on this dose x another 7 (seven) days.  Step 4: After 7 (seven) days of taking 3 (three) tablet at bedtime, begin taking 1 (one) tablet at noon with lunch. Stay on this dose x another 7 (seven) days.  Step 5: After 7 (seven) days of taking 3 (three) tablet at bedtime, and 1 (one) tablet at noon, then begin taking 1 (one) tablet in the afternoon with dinner. Stay on this dose x another 7 (seven) days.  Step 6: After 7 (seven) days of taking 3 (three) tablet at bedtime, 1 (one) tablet at noon, and 1 (one) tablet in the afternoon, then begin taking 1 (one) tablet in the morning with breakfast. Stay on this dose x another 7 (seven) days. At this point you should be taking the medicine 4 (four) times a day, or about every 6 (six) hours. This daily regimen of taking the medicine 4 (four) times a day, will be maintained from now on. You should not take any doses any sooner than every 6 (six)  hours.  Step 7: After 7 (seven) days of taking 3 (three) tablet at bedtime, 1 (one) tablet at noon, 1 (one) tablet in the afternoon, and 1 (one) tablet in the morning, begin taking 2 (two) tablets at noon with lunch. Stay on  this dose x another 7 (seven) days.   Step 8: After 7 (seven) days of taking 3 (three) tablet at bedtime, 2 (two) tablets at noon, 1 (one) tablet in the afternoon, and 1 (one) tablet in the morning, begin taking 2 (two) tablets in the afternoon with dinner. Stay on this dose x another 7 (seven) days.   Step 9: After 7 (seven) days of taking 3 (three) tablet at bedtime, 2 (two) tablets at noon, 2 (two) tablets in the afternoon, and 1 (one) tablet in the morning, begin taking 2 (two) tablets in the morning with breakfast. Stay on this dose x another 7 (seven) days. At this point you should be taking the medicine 4 (four) times a day, or about every 6 (six) hours. This daily regimen of taking the medicine 4 (four) times a day, will be maintained from now on. You should not take any doses any sooner than every 6 (six) hours.  Step 10: After 7 (seven) days of taking 3 (three) tablet at bedtime, 2 (two) tablets at noon, 2 (two) tablets in the afternoon, and 2 (two) tablets in the morning, begin taking 3 (three) tablets at noon with lunch. Stay on this dose x another 7 (seven) days.   Step 11: After 7 (seven) days of taking 3 (three) tablet at bedtime, 3 (three) tablets at noon, 2 (two) tablets in the afternoon, and 2 (two) tablets in the morning, begin taking 3 (three) tablets in the afternoon with dinner. Stay on this dose x another 7 (seven) days.   Step 12: After 7 (seven) days of taking 3 (three) tablet at bedtime, 3 (three) tablets at noon, 3 (three) tablets in the afternoon, and 2 (two) tablet in the morning, begin taking 3 (three) tablets in the morning with breakfast. Stay on this dose x another 7 (seven) days. At this point you should be taking the medicine 4 (four) times a day,  or about every 6 (six) hours. This daily regimen of taking the medicine 4 (four) times a day, will be maintained from now on.   Endpoint: Once you have reached the maximum dose you can tolerate without side-effects, contact your physician so as to evaluate the results of the regimen.   Questions: Feel free to contact us for any questions or problems at [phone]  Pain Score  Introduction: The pain score used by this practice is the Verbal Numerical Rating Scale (VNRS-11). This is an 11-point scale. It is for adults and children 10 years or older. There are significant differences in how the pain score is reported, used, and applied. Forget everything you learned in the past and learn this scoring system.  General Information: The scale should reflect your current level of pain. Unless you are specifically asked for the level of your worst pain, or your average pain. If you are asked for one of these two, then it should be understood that it is over the past 24 hours.  Basic Activities of Daily Living (ADL): Personal hygiene, dressing, eating, transferring, and using restroom.  Instructions: Most patients tend to report their level of pain as a combination of two factors, their physical pain and their psychosocial pain. This last one is also known as "suffering" and it is reflection of how physical pain affects you socially and psychologically. From now on, report them separately. From this point on, when asked to report your pain level, report only your physical pain. Use the following table for reference.  Pain Clinic Pain  Levels (0-5/10)  Pain Level Score Description  No Pain 0   Mild pain 1 Nagging, annoying, but does not interfere with basic activities of daily living (ADL). Patients are able to eat, bathe, get dressed, toileting (being able to get on and off the toilet and perform personal hygiene functions), transfer (move in and out of bed or a chair without assistance), and maintain  continence (able to control bladder and bowel functions). Blood pressure and heart rate are unaffected. A normal heart rate for a healthy adult ranges from 60 to 100 bpm (beats per minute).   Mild to moderate pain 2 Noticeable and distracting. Impossible to hide from other people. More frequent flare-ups. Still possible to adapt and function close to normal. It can be very annoying and may have occasional stronger flare-ups. With discipline, patients may get used to it and adapt.   Moderate pain 3 Interferes significantly with activities of daily living (ADL). It becomes difficult to feed, bathe, get dressed, get on and off the toilet or to perform personal hygiene functions. Difficult to get in and out of bed or a chair without assistance. Very distracting. With effort, it can be ignored when deeply involved in activities.   Moderately severe pain 4 Impossible to ignore for more than a few minutes. With effort, patients may still be able to manage work or participate in some social activities. Very difficult to concentrate. Signs of autonomic nervous system discharge are evident: dilated pupils (mydriasis); mild sweating (diaphoresis); sleep interference. Heart rate becomes elevated (>115 bpm). Diastolic blood pressure (lower number) rises above 100 mmHg. Patients find relief in laying down and not moving.   Severe pain 5 Intense and extremely unpleasant. Associated with frowning face and frequent crying. Pain overwhelms the senses.  Ability to do any activity or maintain social relationships becomes significantly limited. Conversation becomes difficult. Pacing back and forth is common, as getting into a comfortable position is nearly impossible. Pain wakes you up from deep sleep. Physical signs will be obvious: pupillary dilation; increased sweating; goosebumps; brisk reflexes; cold, clammy hands and feet; nausea, vomiting or dry heaves; loss of appetite; significant sleep disturbance with inability to  fall asleep or to remain asleep. When persistent, significant weight loss is observed due to the complete loss of appetite and sleep deprivation.  Blood pressure and heart rate becomes significantly elevated. Caution: If elevated blood pressure triggers a pounding headache associated with blurred vision, then the patient should immediately seek attention at an urgent or emergency care unit, as these may be signs of an impending stroke.    Emergency Department Pain Levels (6-10/10)  Emergency Room Pain 6 Severely limiting. Requires emergency care and should not be seen or managed at an outpatient pain management facility. Communication becomes difficult and requires great effort. Assistance to reach the emergency department may be required. Facial flushing and profuse sweating along with potentially dangerous increases in heart rate and blood pressure will be evident.   Distressing pain 7 Self-care is very difficult. Assistance is required to transport, or use restroom. Assistance to reach the emergency department will be required. Tasks requiring coordination, such as bathing and getting dressed become very difficult.   Disabling pain 8 Self-care is no longer possible. At this level, pain is disabling. The individual is unable to do even the most "basic" activities such as walking, eating, bathing, dressing, transferring to a bed, or toileting. Fine motor skills are lost. It is difficult to think clearly.   Incapacitating pain 9 Pain  becomes incapacitating. Thought processing is no longer possible. Difficult to remember your own name. Control of movement and coordination are lost.   The worst pain imaginable 10 At this level, most patients pass out from pain. When this level is reached, collapse of the autonomic nervous system occurs, leading to a sudden drop in blood pressure and heart rate. This in turn results in a temporary and dramatic drop in blood flow to the brain, leading to a loss of  consciousness. Fainting is one of the body's self defense mechanisms. Passing out puts the brain in a calmed state and causes it to shut down for a while, in order to begin the healing process.    Summary: 1. Refer to this scale when providing [place] with your pain level. 2. Be accurate and careful when reporting your pain level. This will help with your care. 3. Over-reporting your pain level will lead to loss of credibility. 4. Even a level of 1/10 means that there is pain and will be treated at our facility. 5. High, inaccurate reporting will be documented as "Symptom Exaggeration", leading to loss of credibility and suspicions of possible secondary gains such as obtaining more narcotics, or wanting to appear disabled, for fraudulent reasons. 6. Only pain levels of 5 or below will be seen at our facility. 7. Pain levels of 6 and above will be sent to the Emergency Department and the appointment cancelled.

## 2016-03-12 ENCOUNTER — Other Ambulatory Visit: Payer: Self-pay | Admitting: Pain Medicine

## 2016-03-14 LAB — 25-HYDROXY VITAMIN D LCMS D2+D3
25-Hydroxy, Vitamin D-2: <1 ng/mL
25-Hydroxy, Vitamin D-3: 33 ng/mL
25-Hydroxy, Vitamin D: 33 ng/mL

## 2016-03-17 LAB — COMPLIANCE DRUG ANALYSIS, UR

## 2016-03-18 ENCOUNTER — Telehealth: Payer: Self-pay | Admitting: Pain Medicine

## 2016-03-18 NOTE — Telephone Encounter (Signed)
Patient called on 03-18-16 stating he cannot have an MRI, due to he has metal implants in his back. Please call him back regarding appt.

## 2016-03-20 ENCOUNTER — Ambulatory Visit
Admission: RE | Admit: 2016-03-20 | Discharge: 2016-03-20 | Disposition: A | Discharge status: Home / Self Care | Payer: Medicare Other | Source: Ambulatory Visit | Attending: Pain Medicine | Admitting: Pain Medicine

## 2016-03-20 DIAGNOSIS — M47814 Spondylosis without myelopathy or radiculopathy, thoracic region: Secondary | ICD-10-CM | POA: Diagnosis not present

## 2016-03-20 DIAGNOSIS — M9983 Other biomechanical lesions of lumbar region: Secondary | ICD-10-CM | POA: Insufficient documentation

## 2016-03-20 DIAGNOSIS — M47816 Spondylosis without myelopathy or radiculopathy, lumbar region: Secondary | ICD-10-CM | POA: Diagnosis not present

## 2016-03-20 DIAGNOSIS — M5184 Other intervertebral disc disorders, thoracic region: Secondary | ICD-10-CM | POA: Insufficient documentation

## 2016-03-20 DIAGNOSIS — M4807 Spinal stenosis, lumbosacral region: Secondary | ICD-10-CM

## 2016-03-20 DIAGNOSIS — M47894 Other spondylosis, thoracic region: Secondary | ICD-10-CM

## 2016-03-20 DIAGNOSIS — M4804 Spinal stenosis, thoracic region: Secondary | ICD-10-CM

## 2016-03-27 ENCOUNTER — Other Ambulatory Visit: Payer: Medicare Other

## 2016-04-10 ENCOUNTER — Encounter: Payer: Self-pay | Admitting: Pain Medicine

## 2016-04-10 DIAGNOSIS — R7982 Elevated C-reactive protein (CRP): Secondary | ICD-10-CM | POA: Insufficient documentation

## 2016-04-10 NOTE — Progress Notes (Signed)
Normal CRP (C-reactive protein) Level(s): Less than <1.0 mg/dL. Elevated level(s): Levels above 1.0 mg/dL. Clinical significance: C-reactive protein (CRP) is produced by the liver. The level of CRP rises when there is inflammation throughout the body. CRP goes up in response to inflammation. High levels suggests the presence of chronic inflammation but do not identify its location or cause. Drops of previously elevated levels suggest that the inflammation or infection is subsiding and/or responding to treatment. Possible causes: High levels have been observed in obese patients, individuals with bacterial infections, chronic inflammation, or flare-ups of inflammatory conditions. Recommendations: - Consider the use of anti-inflammatory diet and medications.

## 2016-04-16 DIAGNOSIS — G629 Polyneuropathy, unspecified: Secondary | ICD-10-CM | POA: Insufficient documentation

## 2016-05-23 ENCOUNTER — Other Ambulatory Visit: Payer: Self-pay | Admitting: *Deleted

## 2016-05-23 DIAGNOSIS — Z8546 Personal history of malignant neoplasm of prostate: Secondary | ICD-10-CM

## 2016-05-23 DIAGNOSIS — N400 Enlarged prostate without lower urinary tract symptoms: Secondary | ICD-10-CM

## 2016-05-23 NOTE — Progress Notes (Signed)
9:32 AM   Richard Duncan 16-Aug-1950 [id number]  Referring provider: Sofie Hartigan, MD [place], [place] [address]  Chief Complaint  Patient presents with  . Prostate Cancer    6 month follow up   . Benign Prostatic Hypertrophy    HPI: [name] is a 66 year old Caucasian male with prostate cancer, BPH with LUTS and erectile dysfunction who presents today for a 6 month follow-up.  Prostate cancer Patient first underwent TRUSPBx of prostate in 2010 for a PSA of 4 and found to have a Gleason's 6. 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  His most recent PSA at the time of this visit was 2.6 ng/mL on 11/13/2015.    BPH WITH LUTS: His IPSS score today is 26, which is severe lower urinary tract symptomatology. He is unhappy with his quality life due to his urinary symptoms. His previous IPSS score was 19/6.  His major complaint today is the frequency of urination..  He has had these symptoms for several years.  He denies any dysuria, hematuria or suprapubic pain.  Patient found good symptom control on Cialis 5 mg daily, but he found the prescription cost prohibitive and had to discontinue the medication.  He did not find tamsulosin effective.  He also denies any recent fevers, chills, nausea or vomiting.  He has a family history of PCa, with his father being diagnosed with PCa.       Sweetser Name 05/24/16 0900         International Prostate Symptom Score   How often have you had the sensation of not emptying your bladder? About half the time     How often have you had to urinate less than every two hours? Almost always     How often have you found you stopped and started again several times when you urinated? Almost always     How often have you found it difficult to postpone urination? Almost always     How often have you had a weak urinary stream? More  than half the time     How often have you had to strain to start urination? Less than 1 in 5 times     How many times did you typically get up at night to urinate? 3 Times     Total IPSS Score 26       Quality of Life due to urinary symptoms   If you were to spend the rest of your life with your urinary condition just the way it is now how would you feel about that? Unhappy        Score:  1-7 Mild 8-19 Moderate 20-35 Severe   PMH: Past Medical History:  Diagnosis Date  . Anxiety   . Arthritis   . Bilateral carpal tunnel syndrome   . Cancer [facility]) 2011   Prostate Ca  . Complication of anesthesia    urinary retention with Popanol  . Depression   . Difficult intubation   . ED (erectile dysfunction)   . History of blood product transfusion   . History of hiatal hernia   . History of kidney stones   . Hyperlipemia   . Hypertension   . Neuropathy   . Obesity   . Over weight   . Presence of permanent cardiac pacemaker    [employer]tr[employer]  .  Prostatitis   . PTSD (post-traumatic stress disorder)   . Sleep apnea    OSA--use Bi-PAP  . Urinary retention     Surgical History: Past Surgical History:  Procedure Laterality Date  . APPENDECTOMY    . [place], 2006   Lumbar Spine  . BACK SURGERY     x2 Laminectomy  . CARDIAC PACEMAKER PLACEMENT    . CERVICAL FUSION    . CERVICAL SPINE SURGERY N/A 2008  . COLONOSCOPY    . EXCISION OF ABDOMINAL WALL TUMOR N/A 03/08/2016   Procedure: EXCISION OF ABDOMINAL WALL MASS;  Surgeon: Leonie Green, MD;  Location: [facility] ORS;  Service: General;  Laterality: N/A;  . JOINT REPLACEMENT  2004, 2005, 2014, 2015   Bilat Hip Replacements  . SPINAL CORD STIMULATOR INSERTION N/A 07/08/2014   Procedure: LUMBAR SPINAL CORD STIMULATOR INSERTION;  Surgeon: Clydell Hakim, MD;  Location: [facility];  Service: Neurosurgery;  Laterality: N/A;  LUMBAR SPINAL CORD STIMULATOR INSERTION  . TONSILLECTOMY      Home Medications:  Allergies as of 05/24/2016       Reactions   Propofol Other (See Comments)   URINARY RETENTION REQUIRING CATHETERIZATION   Propranolol Other (See Comments)   Urinary retention Urinary retention      Medication List       Accurate as of 05/24/16  9:32 AM. Always use your most recent med list.          amLODipine 10 MG tablet Commonly known as:  NORVASC Take 10 mg by mouth daily.   aspirin EC 81 MG tablet Take 81 mg by mouth daily.   benazepril 20 MG tablet Commonly known as:  LOTENSIN Take 20 mg by mouth daily.   citalopram 40 MG tablet Commonly known as:  CELEXA Take 40 mg by mouth at bedtime.   doxycycline 100 MG capsule Commonly known as:  VIBRAMYCIN 100 mg as needed.   gabapentin 300 MG capsule Commonly known as:  NEURONTIN Take 900 mg by mouth at bedtime.   hydrALAZINE 25 MG tablet Commonly known as:  APRESOLINE Take 25 mg by mouth 2 (two) times daily.   lamoTRIgine 200 MG tablet Commonly known as:  LAMICTAL Take 200 mg by mouth daily.   metoprolol tartrate 25 MG tablet Commonly known as:  LOPRESSOR Take by mouth.   pantoprazole 40 MG tablet Commonly known as:  PROTONIX Take 40 mg by mouth daily.   sildenafil 100 MG tablet Commonly known as:  VIAGRA Take 1 tablet (100 mg total) by mouth daily as needed for erectile dysfunction. Reported on 05/18/2015   sildenafil 20 MG tablet Commonly known as:  REVATIO Take 1 tablet (20 mg total) by mouth as needed. Take 3 to 5 tablets before sexual intercourse   Vitamin D3 2000 units capsule Take 2,000 Units by mouth daily.       Allergies:  Allergies  Allergen Reactions  . Propofol Other (See Comments)    URINARY RETENTION REQUIRING CATHETERIZATION  . Propranolol Other (See Comments)    Urinary retention Urinary retention    Family History: Family History  Problem Relation Age of Onset  . Benign prostatic hyperplasia Father   . Prostate cancer Father   . Hypertension Mother   . Kidney disease Neg Hx   . Kidney cancer  Neg Hx   . Bladder Cancer Neg Hx     Social History:  reports that he quit smoking about 49 years ago. His smoking use included Cigarettes. He quit  after 2.00 years of use. He has never used smokeless tobacco. He reports that he drinks alcohol. He reports that he does not use drugs.  ROS: UROLOGY Frequent Urination?: No Hard to postpone urination?: No Burning/pain with urination?: No Get up at night to urinate?: No Leakage of urine?: No Urine stream starts and stops?: No Trouble starting stream?: No Do you have to strain to urinate?: No Blood in urine?: No Urinary tract infection?: No Sexually transmitted disease?: No Injury to kidneys or bladder?: No Painful intercourse?: No Weak stream?: No Erection problems?: No Penile pain?: No  Gastrointestinal Nausea?: No Vomiting?: No Indigestion/heartburn?: No Diarrhea?: No Constipation?: No  Constitutional Fever: No Night sweats?: No Weight loss?: No Fatigue?: Yes  Skin Skin rash/lesions?: No Itching?: Yes  Eyes Blurred vision?: No Double vision?: No  Ears/Nose/Throat Sore throat?: No Sinus problems?: No  Hematologic/Lymphatic Swollen glands?: No Easy bruising?: No  Cardiovascular Leg swelling?: Yes Chest pain?: No  Respiratory Cough?: No Shortness of breath?: No  Endocrine Excessive thirst?: No  Musculoskeletal Back pain?: Yes Joint pain?: Yes  Neurological Headaches?: No Dizziness?: No  Psychologic Depression?: No Anxiety?: Yes  Physical Exam: BP (!) 149/74   Pulse 100   Ht 6' (1.829 m)   Wt (!) 372 lb (168.7 kg)   BMI 50.45 kg/m   GU: Patient with circumcised phallus.  Urethral meatus is patent.  No penile discharge. No penile lesions or rashes. Scrotum without lesions, cysts, rashes and/or edema.  Testicles are located scrotally bilaterally. No masses are appreciated in the testicles. Left and right epididymis are normal. Rectal: Patient with  normal sphincter tone. Perineum without  scarring or rashes. No rectal masses are appreciated. Prostate is approximately 50 grams, no nodules are appreciated. Seminal vesicles are normal.  Laboratory Data: Lab Results  Component Value Date   WBC 12.5 (H) 02/27/2016   HGB 16.1 02/27/2016   HCT 47.6 02/27/2016   MCV 92.8 02/27/2016   PLT 202 02/27/2016    Lab Results  Component Value Date   CREATININE 1.08 02/27/2016   PSA history:      3.0 ng/mL on 11/25/2011      3.2 ng/mL on 05/25/2012-biopsy in May      3.4 ng/mL on 09/11/2012      1.7 ng/mL on 03/01/2013      1.4 ng/mL on 06/30/2013      2.4 ng/mL on 11/09/2013      2.1 ng/mL on 03/11/2014      2.7 ng/mL on 05/06/2015      2.6 ng/mL on 11/13/2015  Pertinent Imaging Results for ALDRICK, DERRIG (MRN [id number]) as of 05/24/2016 10:12  Ref. Range 05/24/2016 09:21  Scan Result Unknown 196    Assessment & Plan:   1. Prostate cancer:   Patient first underwent TRUSPBx of prostate in 2010 for a PSA of 4 and found to have a Gleason's 6. 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  Patient is currently on active surveillance.  His current PSA is 2.6 ng/mL on 10/16//2017.  He will follow-up in 6 months time for an exam and PSA.  PSA is drawn today.    2. BPH with LUTS  - IPSS score is 19/6, it is worsening  - Continue conservative management, avoiding bladder irritants and timed voiding's  - patient has failed medical therapy, but he is not interested in pursuing a possible outlet procedure at  this time  - RTC in 6 months for IPSS, PSA, PVR and exam   3. Erectile dysfunction  - brand name and generic 100 mg Viagra were cost prohibitive  - script given for sildenafil 20 mg, 3 to 5 tablets, # 50   - RTC in 6 months for SHIM and exam     Return in about 6 months (around 11/23/2016) for IPSS, SHIM, PSA and exam.  Zara Council, [facility]  [place] [address], [place], [place] [address] [phone]

## 2016-05-24 ENCOUNTER — Ambulatory Visit (INDEPENDENT_AMBULATORY_CARE_PROVIDER_SITE_OTHER): Payer: Medicare Other | Admitting: Urology

## 2016-05-24 ENCOUNTER — Other Ambulatory Visit
Admission: RE | Admit: 2016-05-24 | Discharge: 2016-05-24 | Disposition: A | Discharge status: Home / Self Care | Payer: Medicare Other | Source: Ambulatory Visit | Attending: Urology | Admitting: Urology

## 2016-05-24 ENCOUNTER — Other Ambulatory Visit: Payer: Self-pay | Admitting: *Deleted

## 2016-05-24 ENCOUNTER — Encounter: Payer: Self-pay | Admitting: Urology

## 2016-05-24 VITALS — BP 149/74 | HR 100 | Ht 72.0 in | Wt 372.0 lb

## 2016-05-24 DIAGNOSIS — C61 Malignant neoplasm of prostate: Secondary | ICD-10-CM

## 2016-05-24 DIAGNOSIS — N529 Male erectile dysfunction, unspecified: Secondary | ICD-10-CM

## 2016-05-24 DIAGNOSIS — R350 Frequency of micturition: Secondary | ICD-10-CM

## 2016-05-24 DIAGNOSIS — N400 Enlarged prostate without lower urinary tract symptoms: Secondary | ICD-10-CM

## 2016-05-24 DIAGNOSIS — Z8546 Personal history of malignant neoplasm of prostate: Secondary | ICD-10-CM | POA: Insufficient documentation

## 2016-05-24 LAB — PSA: PSA: 2.56 ng/mL (ref 0.00–4.00)

## 2016-05-24 LAB — BLADDER SCAN AMB NON-IMAGING: Scan Result: 196

## 2016-05-24 MED ORDER — SILDENAFIL CITRATE 20 MG PO TABS: 20.0000 mg | ORAL_TABLET | ORAL | 3 refills | Status: DC | PRN

## 2016-05-27 ENCOUNTER — Telehealth: Payer: Self-pay | Admitting: *Deleted

## 2016-05-27 NOTE — Telephone Encounter (Signed)
LMOM for patient to call back about recent lab results.

## 2016-05-27 NOTE — Telephone Encounter (Signed)
-----   Message from Nori Riis, PA-C sent at 05/25/2016 11:17 AM EDT ----- Patient's PSA is stable at 2.56   We will see him in 6 months.  PSA to be drawn before his next appointment.

## 2016-05-28 NOTE — Telephone Encounter (Signed)
Tried to call patient again, no answer, Future orders placed for PSA for return 6 month appt.

## 2016-05-28 NOTE — Telephone Encounter (Signed)
LMOM for patient to return call.

## 2016-05-29 NOTE — Telephone Encounter (Signed)
LMOM for patient to return call. Unable to contact letter sent to patient.

## 2016-06-18 ENCOUNTER — Encounter: Payer: Self-pay | Admitting: Pain Medicine

## 2016-06-18 ENCOUNTER — Ambulatory Visit: Payer: Medicare Other | Attending: Pain Medicine | Admitting: Pain Medicine

## 2016-06-18 VITALS — BP 140/75 | HR 80 | Temp 98.2°F | Resp 18 | Ht 72.0 in | Wt 366.0 lb

## 2016-06-18 DIAGNOSIS — M792 Neuralgia and neuritis, unspecified: Secondary | ICD-10-CM | POA: Insufficient documentation

## 2016-06-18 DIAGNOSIS — M542 Cervicalgia: Secondary | ICD-10-CM | POA: Insufficient documentation

## 2016-06-18 DIAGNOSIS — M47814 Spondylosis without myelopathy or radiculopathy, thoracic region: Secondary | ICD-10-CM | POA: Insufficient documentation

## 2016-06-18 DIAGNOSIS — R51 Headache: Secondary | ICD-10-CM

## 2016-06-18 DIAGNOSIS — M25559 Pain in unspecified hip: Secondary | ICD-10-CM | POA: Diagnosis not present

## 2016-06-18 DIAGNOSIS — M4694 Unspecified inflammatory spondylopathy, thoracic region: Secondary | ICD-10-CM

## 2016-06-18 DIAGNOSIS — M545 Low back pain: Secondary | ICD-10-CM | POA: Insufficient documentation

## 2016-06-18 DIAGNOSIS — M5442 Lumbago with sciatica, left side: Secondary | ICD-10-CM | POA: Diagnosis not present

## 2016-06-18 DIAGNOSIS — M25511 Pain in right shoulder: Secondary | ICD-10-CM

## 2016-06-18 DIAGNOSIS — M4696 Unspecified inflammatory spondylopathy, lumbar region: Secondary | ICD-10-CM

## 2016-06-18 DIAGNOSIS — M4807 Spinal stenosis, lumbosacral region: Secondary | ICD-10-CM

## 2016-06-18 DIAGNOSIS — Z9689 Presence of other specified functional implants: Secondary | ICD-10-CM | POA: Diagnosis not present

## 2016-06-18 DIAGNOSIS — M5030 Other cervical disc degeneration, unspecified cervical region: Secondary | ICD-10-CM

## 2016-06-18 DIAGNOSIS — G8929 Other chronic pain: Secondary | ICD-10-CM

## 2016-06-18 DIAGNOSIS — M5134 Other intervertebral disc degeneration, thoracic region: Secondary | ICD-10-CM

## 2016-06-18 DIAGNOSIS — Z96643 Presence of artificial hip joint, bilateral: Secondary | ICD-10-CM

## 2016-06-18 DIAGNOSIS — G608 Other hereditary and idiopathic neuropathies: Secondary | ICD-10-CM | POA: Insufficient documentation

## 2016-06-18 DIAGNOSIS — G894 Chronic pain syndrome: Secondary | ICD-10-CM | POA: Diagnosis not present

## 2016-06-18 DIAGNOSIS — M4804 Spinal stenosis, thoracic region: Secondary | ICD-10-CM

## 2016-06-18 DIAGNOSIS — M9982 Other biomechanical lesions of thoracic region: Secondary | ICD-10-CM

## 2016-06-18 DIAGNOSIS — M51369 Other intervertebral disc degeneration, lumbar region without mention of lumbar back pain or lower extremity pain: Secondary | ICD-10-CM | POA: Insufficient documentation

## 2016-06-18 DIAGNOSIS — M47816 Spondylosis without myelopathy or radiculopathy, lumbar region: Secondary | ICD-10-CM | POA: Diagnosis not present

## 2016-06-18 DIAGNOSIS — M25512 Pain in left shoulder: Secondary | ICD-10-CM

## 2016-06-18 DIAGNOSIS — M5136 Other intervertebral disc degeneration, lumbar region: Secondary | ICD-10-CM | POA: Insufficient documentation

## 2016-06-18 DIAGNOSIS — M5441 Lumbago with sciatica, right side: Secondary | ICD-10-CM

## 2016-06-18 DIAGNOSIS — M25551 Pain in right hip: Secondary | ICD-10-CM | POA: Diagnosis not present

## 2016-06-18 DIAGNOSIS — G4486 Cervicogenic headache: Secondary | ICD-10-CM

## 2016-06-18 NOTE — Progress Notes (Signed)
Safety precautions to be maintained throughout the outpatient stay will include: orient to surroundings, keep bed in low position, maintain call bell within reach at all times, provide assistance with transfer out of bed and ambulation.  

## 2016-06-18 NOTE — Patient Instructions (Addendum)
Pain Score  Introduction: The pain score used by this practice is the Verbal Numerical Rating Scale (VNRS-11). This is an 11-point scale. It is for adults and children 10 years or older. There are significant differences in how the pain score is reported, used, and applied. Forget everything you learned in the past and learn this scoring system.  General Information: The scale should reflect your current level of pain. Unless you are specifically asked for the level of your worst pain, or your average pain. If you are asked for one of these two, then it should be understood that it is over the past 24 hours.  Basic Activities of Daily Living (ADL): Personal hygiene, dressing, eating, transferring, and using restroom.  Instructions: Most patients tend to report their level of pain as a combination of two factors, their physical pain and their psychosocial pain. This last one is also known as "suffering" and it is reflection of how physical pain affects you socially and psychologically. From now on, report them separately. From this point on, when asked to report your pain level, report only your physical pain. Use the following table for reference.  Pain Clinic Pain Levels (0-5/10)  Pain Level Score Description  No Pain 0   Mild pain 1 Nagging, annoying, but does not interfere with basic activities of daily living (ADL). Patients are able to eat, bathe, get dressed, toileting (being able to get on and off the toilet and perform personal hygiene functions), transfer (move in and out of bed or a chair without assistance), and maintain continence (able to control bladder and bowel functions). Blood pressure and heart rate are unaffected. A normal heart rate for a healthy adult ranges from 60 to 100 bpm (beats per minute).   Mild to moderate pain 2 Noticeable and distracting. Impossible to hide from other people. More frequent flare-ups. Still possible to adapt and function close to normal. It can be very  annoying and may have occasional stronger flare-ups. With discipline, patients may get used to it and adapt.   Moderate pain 3 Interferes significantly with activities of daily living (ADL). It becomes difficult to feed, bathe, get dressed, get on and off the toilet or to perform personal hygiene functions. Difficult to get in and out of bed or a chair without assistance. Very distracting. With effort, it can be ignored when deeply involved in activities.   Moderately severe pain 4 Impossible to ignore for more than a few minutes. With effort, patients may still be able to manage work or participate in some social activities. Very difficult to concentrate. Signs of autonomic nervous system discharge are evident: dilated pupils (mydriasis); mild sweating (diaphoresis); sleep interference. Heart rate becomes elevated (>115 bpm). Diastolic blood pressure (lower number) rises above 100 mmHg. Patients find relief in laying down and not moving.   Severe pain 5 Intense and extremely unpleasant. Associated with frowning face and frequent crying. Pain overwhelms the senses.  Ability to do any activity or maintain social relationships becomes significantly limited. Conversation becomes difficult. Pacing back and forth is common, as getting into a comfortable position is nearly impossible. Pain wakes you up from deep sleep. Physical signs will be obvious: pupillary dilation; increased sweating; goosebumps; brisk reflexes; cold, clammy hands and feet; nausea, vomiting or dry heaves; loss of appetite; significant sleep disturbance with inability to fall asleep or to remain asleep. When persistent, significant weight loss is observed due to the complete loss of appetite and sleep deprivation.  Blood pressure and heart   rate becomes significantly elevated. Caution: If elevated blood pressure triggers a pounding headache associated with blurred vision, then the patient should immediately seek attention at an urgent or  emergency care unit, as these may be signs of an impending stroke.    Emergency Department Pain Levels (6-10/10)  Emergency Room Pain 6 Severely limiting. Requires emergency care and should not be seen or managed at an outpatient pain management facility. Communication becomes difficult and requires great effort. Assistance to reach the emergency department may be required. Facial flushing and profuse sweating along with potentially dangerous increases in heart rate and blood pressure will be evident.   Distressing pain 7 Self-care is very difficult. Assistance is required to transport, or use restroom. Assistance to reach the emergency department will be required. Tasks requiring coordination, such as bathing and getting dressed become very difficult.   Disabling pain 8 Self-care is no longer possible. At this level, pain is disabling. The individual is unable to do even the most "basic" activities such as walking, eating, bathing, dressing, transferring to a bed, or toileting. Fine motor skills are lost. It is difficult to think clearly.   Incapacitating pain 9 Pain becomes incapacitating. Thought processing is no longer possible. Difficult to remember your own name. Control of movement and coordination are lost.   The worst pain imaginable 10 At this level, most patients pass out from pain. When this level is reached, collapse of the autonomic nervous system occurs, leading to a sudden drop in blood pressure and heart rate. This in turn results in a temporary and dramatic drop in blood flow to the brain, leading to a loss of consciousness. Fainting is one of the body's self defense mechanisms. Passing out puts the brain in a calmed state and causes it to shut down for a while, in order to begin the healing process.    Summary: 1. Refer to this scale when providing [place] with your pain level. 2. Be accurate and careful when reporting your pain level. This will help with your care. 3. Over-reporting  your pain level will lead to loss of credibility. 4. Even a level of 1/10 means that there is pain and will be treated at our facility. 5. High, inaccurate reporting will be documented as "Symptom Exaggeration", leading to loss of credibility and suspicions of possible secondary gains such as obtaining more narcotics, or wanting to appear disabled, for fraudulent reasons. 6. Only pain levels of 5 or below will be seen at our facility. 7. Pain levels of 6 and above will be sent to the Emergency Department and the appointment cancelled. _____________________________________________________________________________________________  ____________________________________________________________________________________________  Appointment Policy  It is our goal and responsibility to provide the medical community with assistance in the evaluation and management of patients with chronic pain. Unfortunately our resources are limited. Because we do not have an unlimited amount of time, or available appointments, we are required to closely monitor and manage their use. The following rules exist to maximize their use:  Patient's responsibilities: 1. Punctuality: You are required to be physically present and registered in our facility at least 30 minutes before your appointment. 2. Tardiness: The cutoff is your appointment time. If you have an appointment scheduled for 10:00 AM and you arrive at 10:01, you will be required to reschedule your appointment.  3. Plan ahead: Always assume that you will encounter traffic on your way in. Plan for it. If you are dependent on a driver, make sure they understand these rules and the need to  arrive early. 4. Other appointments and responsibilities: Avoid scheduling any other appointments before or after your pain clinic appointments.  5. Be prepared: Write down everything that you need to discuss with your healthcare provider and give this information to the admitting  nurse. Write down the medications that you will need refilled. Bring your pills and bottles (even the empty ones), to all of your appointments, except for those where a procedure is scheduled. 6. No children or pets: Find someone to take care of them. It is not appropriate to bring them in. 7. Scheduling changes: We request "advanced notification" of any changes or cancellations. 8. Advanced notification: Defined as a time period of more than 24 hours prior to the originally scheduled appointment. This allows for the appointment to be offered to other patients. 9. Rescheduling: When a visit is rescheduled, it will require the cancellation of the original appointment. For this reason they both fall within the category of "Cancellations".  10. Cancellations: They require advanced notification. Any cancellation less than 24 hours before the  appointment will be recorded as a "No Show". 11. No Show: Defined as an unkept appointment where the patient failed to notify or declare to the practice their intention or inability to keep the appointment.  Corrective process for repeat offenders:  1. Tardiness: Three (3) episodes of rescheduling due to late arrivals will be recorded as one (1) "No Show". 2. Cancellation or reschedule: Three (3) cancellations or rescheduling will be recorded as one (1) "No Show". 3. "No Shows": Three (3) "No Shows" within a 12 month period will result in discharge from the practice.  _____________________________________________________________________________________________   Preparing for Procedure with Sedation Instructions: . Oral Intake: Do not eat or drink anything for at least 8 hours prior to your procedure. . Transportation: Public transportation is not allowed. Bring an adult driver. The driver must be physically present in our waiting room before any procedure can be started. [facility] Kitchen Physical Assistance: Bring an adult capable of physically assisting you, in the event you  need help. . Blood Pressure Medicine: Take your blood pressure medicine with a sip of water the morning of the procedure. . Insulin: Take only  of your normal insulin dose. . Preventing infections: Shower with an antibacterial soap the morning of your procedure. . Build-up your immune system: Take 1000 mg of Vitamin C with every meal (3 times a day) the day prior to your procedure. . Pregnancy: If you are pregnant, call and cancel the procedure. . Sickness: If you have a cold, fever, or any active infections, call and cancel the procedure. . Arrival: You must be in the facility at least 30 minutes prior to your scheduled procedure. . Children: Do not bring children with you. . Dress appropriately: Bring dark clothing that you would not mind if they get stained. . Valuables: Do not bring any jewelry or valuables. Procedure appointments are reserved for interventional treatments only. Marland Kitchen No Prescription Refills. . No medication changes will be discussed during procedure appointments. No disability issues will be discussed.Facet Blocks Patient Information  Description: The facets are joints in the spine between the vertebrae.  Like any joints in the body, facets can become irritated and painful.  Arthritis can also effect the facets.  By injecting steroids and local anesthetic in and around these joints, we can temporarily block the nerve supply to them.  Steroids act directly on irritated nerves and tissues to reduce selling and inflammation which often leads to decreased pain.  Facet blocks  may be done anywhere along the spine from the neck to the low back depending upon the location of your pain.   After numbing the skin with local anesthetic (like Novocaine), a small needle is passed onto the facet joints under x-ray guidance.  You may experience a sensation of pressure while this is being done.  The entire block usually lasts about 15-25 minutes.   Conditions which may be treated by facet  blocks:   Low back/buttock pain  Neck/shoulder pain  Certain types of headaches  Preparation for the injection:  12. Do not eat any solid food or dairy products within 8 hours of your appointment. 13. You may drink clear liquid up to 3 hours before appointment.  Clear liquids include water, black coffee, juice or soda.  No milk or cream please. 14. You may take your regular medication, including pain medications, with a sip of water before your appointment.  Diabetics should hold regular insulin (if taken separately) and take 1/2 normal NPH dose the morning of the procedure.  Carry some sugar containing items with you to your appointment. 15. A driver must accompany you and be prepared to drive you home after your procedure. 59. Bring all your current medications with you. 17. An IV may be inserted and sedation may be given at the discretion of the physician. 18. A blood pressure cuff, EKG and other monitors will often be applied during the procedure.  Some patients may need to have extra oxygen administered for a short period. 3. You will be asked to provide medical information, including your allergies and medications, prior to the procedure.  We must know immediately if you are taking blood thinners (like Coumadin/Warfarin) or if you are allergic to IV iodine contrast (dye).  We must know if you could possible be pregnant.  Possible side-effects:   Bleeding from needle site  Infection (rare, may require surgery)  Nerve injury (rare)  Numbness & tingling (temporary)  Difficulty urinating (rare, temporary)  Spinal headache (a headache worse with upright posture)  Light-headedness (temporary)  Pain at injection site (serveral days)  Decreased blood pressure (rare, temporary)  Weakness in arm/leg (temporary)  Pressure sensation in back/neck (temporary)   Call if you experience:   Fever/chills associated with headache or increased back/neck pain  Headache worsened by  an upright position  New onset, weakness or numbness of an extremity below the injection site  Hives or difficulty breathing (go to the emergency room)  Inflammation or drainage at the injection site(s)  Severe back/neck pain greater than usual  New symptoms which are concerning to you  Please note:  Although the local anesthetic injected can often make your back or neck feel good for several hours after the injection, the pain will likely return. It takes 3-7 days for steroids to work.  You may not notice any pain relief for at least one week.  If effective, we will often do a series of 2-3 injections spaced 3-6 weeks apart to maximally decrease your pain.  After the initial series, you may be a candidate for a more permanent nerve block of the facets.  If you have any questions, please call #[phone]) [facility]

## 2016-06-18 NOTE — Progress Notes (Addendum)
Patient's Name: Richard Duncan  MRN: [id number]  Referring Provider: Sofie Hartigan, MD  DOB: Jun 07, 1950  PCP: Sofie Hartigan, MD  DOS: 06/18/2016  Note by: Kathlen Brunswick. Dossie Arbour, MD  Service setting: Ambulatory outpatient  Specialty: Interventional Pain Management  Location: [facility] (AMB) Pain Management Facility    Patient type: Established   Primary Reason(s) for Visit: Encounter for evaluation before starting new chronic pain management plan of care (Level of risk: moderate) CC: Neck Pain; Back Pain (lower); and Hip Pain (right)  HPI  [name] is a 66 y.o. year old, male patient, who comes today for a follow-up evaluation to review the test results and decide on a treatment plan. 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[place]); Chronic pain syndrome; Chronic prescription benzodiazepine use; Lower extremity neuropathy (Bilateral); Chronic hip pain (Location of Primary Source of Pain) (Bilateral) (R>L); History of bilateral total hip arthroplasty; Chronic low back pain (Location of Secondary source of pain) (Bilateral) (R>L); Chronic neck pain (Location of Tertiary source of pain) (Midline) (Bilateral); Disturbance of skin sensation; Lumbar facet arthropathy (Bilateral: L12, L2-3, L3-4, L4-5, L5-S1); Lumbar foraminal stenosis (Bilateral: L2-3, L3-4, and L4-5); Thoracic foraminal stenosis (Right: T8-9 and T9-10); Lumbar spondylosis; Osteoarthritis; Thoracic spondylosis; [employer] spinal cord stimulator; Elevated C-reactive protein (CRP); Neuropathy; Chronic pain of both shoulders (L>R); Cervicogenic headache (B)(L>R); Lumbar lateral recess stenosis (Bilateral: L2-3, L3-4,  and L4-5); Lumbar facet syndrome (Bilateral) (R>L); 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 His primarily concern today is the Neck Pain; Back Pain (lower); and Hip Pain (right)  Pain Assessment: Self-Reported Pain Score: 8 /10 Clinically the patient looks like a 2/10 Reported level is inconsistent with clinical observations. Information on the proper use of the pain scale provided to the patient today Pain Type: Chronic pain Pain Location: Neck (back, hip) Pain Descriptors / Indicators: Aching Pain Frequency: Constant  [name] comes in today for a follow-up visit after his initial evaluation on 03/18/2016. Today we went over the results of his tests. These were explained in "[place]man's terms". During today's appointment we went over my diagnostic impression, as well as the proposed treatment plan.  In considering the treatment plan options, Mr. Corvin was reminded that I no longer take patients for medication management only. I asked him to let me know if he had no intention of taking advantage of the interventional therapies, so that we could make arrangements to provide this space to someone interested. I also made it clear that undergoing interventional therapies for the purpose of getting pain medications is very inappropriate on the part of a patient, and it will not be tolerated in this practice. This type of behavior would suggest true addiction and therefore it requires referral to an addiction specialist.   Further details on both, my assessment(s),  as well as the proposed treatment plan, please see below. Controlled Substance Pharmacotherapy Assessment REMS (Risk Evaluation and Mitigation Strategy)  Analgesic: No opioids since 07/08/2014. He  takes diazepam on a daily basis. MME/day: 0 mg/day. Pill Count: None expected due to no prior prescriptions written by our practice. Pharmacokinetics: N/A Pharmacodynamics: N/A Monitoring: [place] PMP: Online review of the past 50-monthperiod previously conducted. Not applicable at this point since we have not taken over the patient's medication management yet. List of all Serum Drug Screening Test(s):  No results found for: AMPHSCRSER, BARBSCRSER, BENZOSCRSER, COCAINSCRSER, PCPSCRSER, THCSCRSER, OPIATESCRSER, [place] of [place] UDS test(s) done:  Lab Results  Component Value Date   SUMMARY FINAL 03/11/2016   Last UDS on record: Summary  Date Value Ref Range Status  03/11/2016 FINAL  Final    Comment:    ==================================================================== TOXASSURE COMP DRUG ANALYSIS,UR ==================================================================== Test                             Result       Flag       Units Drug Present and Declared for Prescription Verification   Oxazepam                       192          EXPECTED   ng/mg creat   Temazepam                      99           EXPECTED   ng/mg creat    Oxazepam and temazepam are expected metabolites of diazepam.    Oxazepam is also an expected metabolite of other benzodiazepine    drugs, including chlordiazepoxide, prazepam, clorazepate,    halazepam, and temazepam.  Oxazepam and temazepam are available    as scheduled prescription medications.   Gabapentin                     PRESENT      EXPECTED   Lamotrigine                    PRESENT      EXPECTED   Citalopram                     PRESENT      EXPECTED   Desmethylcitalopram            PRESENT      EXPECTED    Desmethylcitalopram is an expected  metabolite of citalopram or    the enantiomeric form, escitalopram.   Acetaminophen                  PRESENT      EXPECTED Drug Absent but Declared for Prescription Verification   Hydrocodone                    Not Detected UNEXPECTED ng/mg creat   Salicylate                     Not Detected UNEXPECTED    Aspirin, as indicated in the declared medication list, is not    always detected even when used as directed. ==================================================================== Test                      Result    Flag   Units      Ref Range   Creatinine              109  mg/dL      >=20 ==================================================================== Declared Medications:  The flagging and interpretation on this report are based on the  following declared medications.  Unexpected results may arise from  inaccuracies in the declared medications.  **Note: The testing scope of this panel includes these medications:  Citalopram  Diazepam  Gabapentin  Hydrocodone (Hydrocodone-Acetaminophen)  Lamotrigine  **Note: The testing scope of this panel does not include small to  moderate amounts of these reported medications:  Acetaminophen (Hydrocodone-Acetaminophen)  Aspirin  **Note: The testing scope of this panel does not include following  reported medications:  Amlodipine  Atorvastatin  Benazepril  Cholecalciferol  Doxycycline  Hydralazine  Levofloxacin  Olopatadine  Pantoprazole  Sildenafil ==================================================================== For clinical consultation, please call [phone]. ====================================================================    UDS interpretation: Unexpected findings not considered significantly abnormal          Medication Assessment Form: Patient introduced to form today Treatment compliance: Treatment may start today if patient agrees with proposed plan. Evaluation of compliance is not applicable at  this point Risk Assessment Profile: Aberrant behavior: See initial evaluations. None observed or detected today Comorbid factors increasing risk of overdose: Concomitant use of Benzodiazepines Risk Mitigation Strategies:  Patient opioid safety counseling: No controlled substances prescribed Patient-Prescriber Agreement (PPA): No agreement signed  Controlled substance notification to other providers: None required. No opioid therapy  Pharmacologic Plan: Mr. Amrhein is not a candidate for opioid therapy at this time  Laboratory Chemistry  Inflammation Markers Lab Results  Component Value Date   CRP 1.1 (H) 03/11/2016   ESRSEDRATE 6 03/11/2016   (CRP: Acute Phase) (ESR: Chronic Phase) Renal Function Markers Lab Results  Component Value Date   BUN 16 02/27/2016   CREATININE 1.08 02/27/2016   GFRAA >60 02/27/2016   GFRNONAA >60 02/27/2016   Hepatic Function Markers Lab Results  Component Value Date   AST 20 02/27/2016   ALT 20 02/27/2016   ALBUMIN 4.0 02/27/2016   ALKPHOS 89 02/27/2016   Electrolytes Lab Results  Component Value Date   NA 141 02/27/2016   K 3.9 02/27/2016   CL 108 02/27/2016   CALCIUM 9.0 02/27/2016   MG 1.7 03/11/2016   Neuropathy Markers Lab Results  Component Value Date   VITAMINB12 226 03/11/2016   Bone Pathology Markers Lab Results  Component Value Date   ALKPHOS 89 02/27/2016   25OHVITD1 33 03/11/2016   25OHVITD2 <1.0 03/11/2016   25OHVITD3 33 03/11/2016   CALCIUM 9.0 02/27/2016   Coagulation Parameters Lab Results  Component Value Date   INR 1.0 07/14/2013   LABPROT 13.0 07/14/2013   APTT 31.8 07/14/2013   PLT 202 02/27/2016   Cardiovascular Markers Lab Results  Component Value Date   HGB 16.1 02/27/2016   HCT 47.6 02/27/2016   Note: Lab results reviewed.  Recent Diagnostic Imaging Review  Cervical Imaging: Cervical MR wo contrast:  Results for orders placed in visit on 01/12/08  MR C Spine Ltd W/O Cm   Narrative *  PRIOR REPORT IMPORTED FROM AN EXTERNAL SYSTEM *   PRIOR REPORT IMPORTED FROM THE SYNGO [place] EXAM:    neck arm pain  COMMENTS:   PROCEDURE:     MMR - MMR CERVICAL SPINE WO CONT  - Jan 12 2008 12:27PM   RESULT:     Non-gadolinium MR imaging of the cervical spine is performed  utilizing sagittal and axial T1 and T2-weighted sequences. There is no  previous exam for comparison.   Degenerative disc  space narrowing and hypertrophic marginal osteophyte  formation is seen at the C3-C4 and C5-C6 levels. A diffuse or focal disc  herniation does not appear to be definitely identified. There is moderate  spinal canal stenosis at C3-C4 and C5-C6 without abnormal spinal cord  signal  or cord deformity. Bilateral foraminal narrowing is present at C5-C6. No  significant foraminal narrowing is present otherwise. Degenerative  endplate  signal changes are seen at C5-C6 and C6-C7. There is some mild to moderate  disc space narrowing at C6-C7.   IMPRESSION:      Areas of spinal canal and neural foraminal narrowing as  described with foraminal narrowing bilaterally at C5-C6 secondary to  degenerative disc disease and spinal canal narrowing at C3-C4 and C5-C6 to  the greatest extent. Clinical correlation is recommended for distribution  and severity of symptoms.   Thank you for the opportunity to contribute to the care of your patient.       Thoracic Imaging: Thoracic CT wo contrast:  Results for orders placed during the hospital encounter of 03/20/16  CT THORACIC SPINE WO CONTRAST   Narrative CLINICAL DATA:  Back pain.  EXAM: CT THORACIC AND LUMBAR SPINE WITHOUT CONTRAST  TECHNIQUE: Multidetector CT imaging of the thoracic and lumbar spine was performed without contrast. Multiplanar CT image reconstructions were also generated.  COMPARISON:  CT lumbar spine 03/11/2014.  FINDINGS: Image quality is reduced due to body habitus.  CT THORACIC SPINE  FINDINGS  Alignment: Anatomic  Vertebrae: No worrisome osseous lesion  Paraspinal and other soft tissues: Dorsal column stimulator leads enter the spinal canal at T12-L1 and appears appropriately placed. Electrode array terminates opposite T8 spinous process.Pacemaker.  Disc levels: Significant vacuum phenomenon at T9-10, with facet joint vacuum phenomenon. Mild stenosis at this level. Moderate facet arthropathy also noted at T7-8 and T8-9 bilaterally. Foraminal narrowing due to bony overgrowth from the facet at T8-9 and T9-10 on the RIGHT. No large calcific protrusion is evident.  CT LUMBAR SPINE FINDINGS  Segmentation: 5 lumbar type vertebrae.  Alignment: Normal.  Vertebrae: Osseous spurring. Subchondral cyst formation/Schmorl's nodes most prominent at L2-L3. Osseous spurring.  Paraspinal and other soft tissues: Negative.  Disc levels:  T12-L1 vacuum phenomenon. Anterior osseous spurring. No definite compressive lesion.  L1-L2:  Osseous spurring with facet arthropathy, worse on the RIGHT.  L2-L3: Vacuum phenomenon. Osseous spurring with facet arthropathy. Mild stenosis, subarticular zone, and foraminal zone narrowing.  L3-L4: Osseous spurring with facet arthropathy. Mild stenosis, subarticular zone, and foraminal zone narrowing.  L4-L5: Osseous spurring with facet arthropathy and ligamentum flavum calcification. Moderate stenosis with subarticular zone and foraminal zone narrowing.  L5-S1: Unremarkable disc space. Mild facet arthropathy. No definite stenosis or impingement.  IMPRESSION: CT THORACIC SPINE IMPRESSION  Thoracic spondylosis. Vacuum phenomenon at T9-10. Foraminal narrowing at T8-9 and T9-10 on the RIGHT due to bony overgrowth. Unremarkable dorsal column stimulator.  CT LUMBAR SPINE IMPRESSION  Lumbar spondylosis, similar to 2016. No acute abnormality is evident.   Electronically Signed   By: Staci Righter M.D.   On: 03/20/2016 14:35     Thoracic CT w/wo contrast:  Results for orders placed in visit on 03/11/14  CT T Spine Ltd Wo Or W/ Cm   Narrative * PRIOR REPORT IMPORTED FROM AN EXTERNAL SYSTEM *   CLINICAL DATA:  Chronic thoracic spine pain.   EXAM:  CT THORACIC SPINE WITHOUT CONTRAST   TECHNIQUE:  Multidetector CT imaging of the thoracic spine was performed without  intravenous contrast administration. Multiplanar  CT image  reconstructions were also generated.   COMPARISON:  None.   FINDINGS:  There is a slight thoracic scoliosis with convexity to the right.   Solid interbody fusion at C6-7. Slight disc space narrowing and  anterior osteophyte formation at C7-T1. No neural impingement.  Slight left facet arthritis.   T1-2 through T3-4:  Normal.   T4-5: Calcification of the left ligamentum flavum. Slight narrowing  of the left lateral recess with slight left facet arthritis. The  disc is normal.   T5-6: Moderate right facet arthritis. Minimal osteophytes from the  inferior endplate of T5 in the right lateral recess.   T6-7: Small Schmorl's node in the superior plate of T7. Small mobile  broad-based endplate osteophytes without neural impingement. Slight  bilateral facet arthritis. Anterior osteophytes appear to fuse this  level.   T7-8: Normal disc. Moderately severe left facet arthritis with  moderate left foraminal stenosis. Slight right facet arthritis.  Anterior osteophytes fuse this level.   T8-9: Normal disc. Moderately severe bilateral facet arthritis.  Anterior osteophytes fuse this level.   T9-10: Normal discs. Anterior osteophytes fuse this level. Severe  right and slight left facet arthritis. Fairly severe right foraminal  stenosis.   T10-11: Disc degeneration. No disc protrusion. Severe right and  moderate left facet arthritis. Severe right foraminal stenosis.   T11-12:  Normal disc.  Anterior osteophytes fuse this level.   T12-L1: Small Schmorl's node in the superior endplate  of L1. No disc  protrusion. Anterior osteophytes fuse this level.   IMPRESSION:  1. Diffuse degenerative changes in the thoracic spine. Severe right  foraminal stenosis at T9-10 and T10-11.  2. Multilevel facet arthritis in the thoracic spine without neural  impingement.    Electronically Signed    By: Lorriane Shire M.D.    On: 03/11/2014 17:05       Lumbosacral Imaging: Lumbar MR wo contrast:  Results for orders placed in visit on 08/25/09  [place] W/O Cm   Narrative * PRIOR REPORT IMPORTED FROM AN EXTERNAL SYSTEM *   PRIOR REPORT IMPORTED FROM THE SYNGO [place] EXAM:    low back pain  w radiculopathy  COMMENTS:   PROCEDURE:     MMR - MMR LUMBAR SPINE WO CONTRAST  - Aug 25 2009  1:24PM   RESULT:     Non gadolinium MR imaging of the lumbar spine is performed in  the standard fashion and compared to the previous examination dated  12/28/2007.   As demonstrated on the previous exam, there is severe spinal stenosis at  the  L3-L4 level secondary to disc herniation, facet hypertrophy and  hypertrophy  of the ligamentum flavum. Surgical consultation is again recommended.  There  does not appear to be any improvement since the previous study. There is  mild to moderate left-sided foraminal narrowing at the L3-L4 and L2-L3  levels with mild to moderate right-sided L3-L4 foraminal narrowing and  mild  L2-L3 right-sided foraminal narrowing. The vertebral body heights and  intervertebral disc spaces are maintained. There does appear to be some  slight increased signal on the fat-suppressed T2 sagittal images involving  the L3 vertebral body posterior mid to upper portion. There is decreased  signal in this area on the T1-weighted sagittal sequence images which is  new  compared to the previous study and could represent a Schmorl's node. Modic  endplate degenerative changes may be present as well. There does not  appear  to be definite evidence of  discitis. Multilevel hypertrophic endplate  spurring is seen in the thoracolumbar region. No significant sacral canal  narrowing is seen. The conus medullaris terminates at the L1 mid to lower  level.   IMPRESSION:   There are again changes of degenerative disc disease with disc herniation  and spinal canal as well as neural foraminal stenosis at the L3-L4 level  and  to a lesser extent at L2-L3. Other changes as listed above. Surgical  consultation is recommended.   Thank you for the opportunity to contribute to the care of your patient.       Lumbar MR w/wo contrast:  Results for orders placed in visit on 12/28/07  MR Lumbar Spine W Wo Contrast   Narrative * PRIOR REPORT IMPORTED FROM AN EXTERNAL SYSTEM *   PRIOR REPORT IMPORTED FROM THE SYNGO [place] FOR EXAM:    back pain herniated disc  dr request [facility]  COMMENTS:  [phone]   PROCEDURE:     MMR - MMR LUMBAR SPINE WO/W  - Dec 28 2007 10:54AM   RESULT:     Pre- and postgadolinium MR imaging of the lumbar spine is  performed. There is no previous study for comparison. The patient has  undergone a laminectomy in the past.   The examination demonstrates disc protrusion secondary to herniation at  the  L3-L4 level with moderate to severe spinal stenosis at this level. There  is  almost complete obliteration of the CSF signal. There is also facet  hypertrophy and ligamental hypertrophy contributing to the spinal  stenosis.  There does not appear to be extensive extrusion of disc material from this  level. There is foraminal narrowing bilaterally at L3-L4 that appears to  be  mild to moderate. There is some mild to moderate foraminal stenosis at  L2-L3  greater on the LEFT. No marrow edema is appreciated. There is no abnormal  enhancement following intravenous administration of gadolinium based  contrast. The conus medullaris appears to terminate at the L1 level. There  is mild to moderate spinal canal  narrowing at the L2-L3 level secondary to  diffuse disc bulge as well as facet hypertrophy and hypertrophy of the  ligamentum flavum. The AP dimension of the thecal sac at this level  appears  to be approximately 9 mm in the midline anterior to posterior. At other  levels, the spinal canal is patent. No other areas of significant  foraminal  impingement are appreciated. There is some diffuse foraminal narrowing.   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 There is some foraminal impingement at this  level and at L2-L3 as described. There is mild to moderate spinal canal  stenosis at the L2-L3 level.   Thank you for the opportunity to contribute to the care of your patient.       Lumbar CT wo contrast:  Results for orders placed during the hospital encounter of 03/20/16  CT LUMBAR SPINE WO CONTRAST   Narrative CLINICAL DATA:  Back pain.  EXAM: CT THORACIC AND LUMBAR SPINE WITHOUT CONTRAST  TECHNIQUE: Multidetector CT imaging of the thoracic and lumbar spine was performed without contrast. Multiplanar CT image reconstructions were also generated.  COMPARISON:  CT lumbar spine 03/11/2014.  FINDINGS: Image quality is reduced due to body habitus.  CT THORACIC SPINE FINDINGS  Alignment: Anatomic  Vertebrae: No worrisome osseous lesion  Paraspinal and other soft tissues:  Dorsal column stimulator leads enter the spinal canal at T12-L1 and appears appropriately placed. Electrode array terminates opposite T8 spinous process.Pacemaker.  Disc levels: Significant vacuum phenomenon at T9-10, with facet joint vacuum phenomenon. Mild stenosis at this level. Moderate facet arthropathy also noted at T7-8 and T8-9 bilaterally. Foraminal narrowing due to bony overgrowth from the facet at T8-9 and T9-10 on the RIGHT. No large calcific protrusion is evident.  CT LUMBAR SPINE FINDINGS  Segmentation: 5 lumbar  type vertebrae.  Alignment: Normal.  Vertebrae: Osseous spurring. Subchondral cyst formation/Schmorl's nodes most prominent at L2-L3. Osseous spurring.  Paraspinal and other soft tissues: Negative.  Disc levels:  T12-L1 vacuum phenomenon. Anterior osseous spurring. No definite compressive lesion.  L1-L2:  Osseous spurring with facet arthropathy, worse on the RIGHT.  L2-L3: Vacuum phenomenon. Osseous spurring with facet arthropathy. Mild stenosis, subarticular zone, and foraminal zone narrowing.  L3-L4: Osseous spurring with facet arthropathy. Mild stenosis, subarticular zone, and foraminal zone narrowing.  L4-L5: Osseous spurring with facet arthropathy and ligamentum flavum calcification. Moderate stenosis with subarticular zone and foraminal zone narrowing.  L5-S1: Unremarkable disc space. Mild facet arthropathy. No definite stenosis or impingement.  IMPRESSION: CT THORACIC SPINE IMPRESSION  Thoracic spondylosis. Vacuum phenomenon at T9-10. Foraminal narrowing at T8-9 and T9-10 on the RIGHT due to bony overgrowth. Unremarkable dorsal column stimulator.  CT LUMBAR SPINE IMPRESSION  Lumbar spondylosis, similar to 2016. No acute abnormality is evident.   Electronically Signed   By: Staci Righter M.D.   On: 03/20/2016 14:35    Lumbar DG 1V:  Results for orders placed during the hospital encounter of 07/08/14  DG Lumbar Spine 1 View   Narrative CLINICAL DATA:  Elective surgery  EXAM: LUMBAR SPINE - 1 VIEW; DG C-ARM 61-120 MIN  COMPARISON:  None.  FINDINGS: Single intraoperative spot image over the lower thoracic and upper lumbar spine demonstrate spinal stimulator wires in place projecting over the T8 vertebral body.  IMPRESSION: Spinal stimulator wires as above.   Electronically Signed   By: Rolm Baptise M.D.   On: 07/08/2014 13:00    Hip Imaging: Hip-R DG 2-3 views:  Results for orders placed during the hospital encounter of 03/11/16  DG HIP  UNILAT W OR W/O PELVIS 2-3 VIEWS RIGHT   Narrative CLINICAL DATA:  66 year old male with hip pain. Chronic hip arthroplasty. Initial encounter.  EXAM: DG HIP (WITH OR WITHOUT PELVIS) 2-3V RIGHT  COMPARISON:  Left hip series today, CT Abdomen 11/24/2014 and earlier.  FINDINGS: Bilateral total hip arthroplasty changes, the left are described separately today. The right hip hardware appears intact and normally aligned. Extensive dystrophic ossification about the right hip, some which was present on the operative images in 2014, and some of which represents healed intertrochanteric fracture. No evidence of hardware loosening. No acute osseous abnormality identified.  IMPRESSION: 1. Right total hip arthroplasty appears intact and aligned with no acute osseous abnormality identified. 2. Left hip and pelvis findings reported separately today.   Electronically Signed   By: Genevie Ann M.D.   On: 03/11/2016 14:51    Hip-L DG 2-3 views:  Results for orders placed during the hospital encounter of 03/11/16  DG HIP UNILAT W OR W/O PELVIS 2-3 VIEWS LEFT   Narrative CLINICAL DATA:  66 year old male with hip pain. Chronic hip arthroplasty. Initial encounter.  EXAM: DG HIP (WITH OR WITHOUT PELVIS) 2-3V LEFT  COMPARISON:  CT Abdomen and Pelvis 11/24/2014 and earlier  FINDINGS: Partially visible spinal stimulator device generator projecting over the  right flank. Lower lumbar osteophytosis. Pelvis appears intact. Bone mineralization is within normal limits. Sacral ala and SI joints within normal limits.  Left total hip arthroplasty hardware appears intact and aligned. Dystrophic ossification about the proximal left femur appears to in part reflect healed greater trochanter fracture since 2015. No evidence of hardware loosening. No acute osseous abnormality identified; right hip in proximal right femur reported separately today.  IMPRESSION: 1. Left total hip arthroplasty and healed  fracture of the left greater trochanter with no adverse features identified. 2. Right hip series reported separately today.   Electronically Signed   By: Genevie Ann M.D.   On: 03/11/2016 14:49    Knee Imaging: Knee-R DG 4 views:  Results for orders placed in visit on 07/17/05  DG Knee Complete 4 Views Right   Narrative * PRIOR REPORT IMPORTED FROM AN EXTERNAL SYSTEM *   PRIOR REPORT IMPORTED FROM THE [facility] WORKFLOW SYSTEM   REASON FOR EXAM:  RIGHT  knee pain fax [phone]  COMMENTS:   PROCEDURE:     DXR - DXR KNEE RT COMP WITH OBLIQUES  - Jul 17 2005  9:43AM   RESULT:          Five views of the RIGHT knee were obtained.   No fracture, dislocation, or other acute bony abnormality is seen.  The  knee  joint space is well maintained.  The patella is intact.  In the AP view,  there is observed very slight hypertrophic spurring at the knee medially  and  laterally.   IMPRESSION:   1.     No fracture or other acute change is identified.  2.     The knee joint space appears well maintained.  3.     There is slight hypertrophic spurring at the knee medially and  laterally.   Thank you for this opportunity to contribute to the care of your patient.       Note: Results of ordered imaging test(s) reviewed and explained to patient in Layman's terms. Copy of results provided to patient  Meds  The patient has a current medication list which includes the following prescription(s): amlodipine, aspirin ec, benazepril, vitamin d3, doxycycline, and sildenafil.  Current Outpatient Prescriptions on File Prior to Visit  Medication Sig  . amLODipine (NORVASC) 10 MG tablet Take 10 mg by mouth daily.   Marland Kitchen aspirin EC 81 MG tablet Take 81 mg by mouth daily.   . benazepril (LOTENSIN) 20 MG tablet Take 20 mg by mouth daily.   . Cholecalciferol (VITAMIN D3) 2000 UNITS capsule Take 2,000 Units by mouth daily.   Marland Kitchen doxycycline (VIBRAMYCIN) 100 MG capsule 100 mg as needed.   . sildenafil (REVATIO) 20  MG tablet Take 1 tablet (20 mg total) by mouth as needed. Take 3 to 5 tablets before sexual intercourse   No current facility-administered medications on file prior to visit.    ROS  Constitutional: Denies any fever or chills Gastrointestinal: No reported hemesis, hematochezia, vomiting, or acute GI distress Musculoskeletal: Denies any acute onset joint swelling, redness, loss of ROM, or weakness Neurological: No reported episodes of acute onset apraxia, aphasia, dysarthria, agnosia, amnesia, paralysis, loss of coordination, or loss of consciousness  Allergies  Mr. Meyerhoff is allergic to propofol and propranolol.  Henry  Drug: Mr. Mceachern  reports that he does not use drugs. Alcohol:  reports that he drinks alcohol. Tobacco:  reports that he quit smoking about 50 years ago. His smoking use included Cigarettes. He quit  after 2.00 years of use. He has never used smokeless tobacco. Medical:  has a past medical history of Anxiety; Arthritis; Arthropathia (11/08/2013); Bilateral carpal tunnel syndrome; Cancer ([place]) (2011); Complication of anesthesia; Depression; Difficult intubation; ED (erectile dysfunction); History of blood product transfusion; History of hiatal hernia; History of kidney stones; Hyperlipemia; Hypertension; Neuropathy; Obesity; Over weight; Presence of permanent cardiac pacemaker; Prostatitis; PTSD (post-traumatic stress disorder); Sleep apnea; and Urinary retention. Family: family history includes Benign prostatic hyperplasia in his father; Hypertension in his mother; Prostate cancer in his father.  Past Surgical History:  Procedure Laterality Date  . APPENDECTOMY    . [place], 2006   Lumbar Spine  . BACK SURGERY     x2 Laminectomy  . CARDIAC PACEMAKER PLACEMENT    . CERVICAL FUSION    . CERVICAL SPINE SURGERY N/A 2008  . COLONOSCOPY    . EXCISION OF ABDOMINAL WALL TUMOR N/A 03/08/2016   Procedure: EXCISION OF ABDOMINAL WALL MASS;  Surgeon: Leonie Green,  MD;  Location: [facility];  Service: General;  Laterality: N/A;  . JOINT REPLACEMENT  2004, 2005, 2014, 2015   Bilat Hip Replacements  . SPINAL CORD STIMULATOR INSERTION N/A 07/08/2014   Procedure: LUMBAR SPINAL CORD STIMULATOR INSERTION;  Surgeon: Clydell Hakim, MD;  Location: [place];  Service: Neurosurgery;  Laterality: N/A;  LUMBAR SPINAL CORD STIMULATOR INSERTION  . TONSILLECTOMY     Constitutional Exam  General appearance: Well nourished, well developed, and well hydrated. In no apparent acute distress Vitals:   06/18/16 1418 06/18/16 1421  BP:  140/75  Pulse: 80   Resp: 18   Temp: 98.2 F (36.8 C)   SpO2: 96%   Weight: (!) 366 lb (166 kg)   Height: 6' (1.829 m)    BMI Assessment: Estimated body mass index is 49.64 kg/m as calculated from the following:   Height as of this encounter: 6' (1.829 m).   Weight as of this encounter: 366 lb (166 kg).  BMI interpretation table: BMI level Category Range association with higher incidence of chronic pain  <18 kg/m2 Underweight   18.5-24.9 kg/m2 Ideal body weight   25-29.9 kg/m2 Overweight Increased incidence by 20%  30-34.9 kg/m2 Obese (Class I) Increased incidence by 68%  35-39.9 kg/m2 Severe obesity (Class II) Increased incidence by 136%  >40 kg/m2 Extreme obesity (Class III) Increased incidence by 254%   BMI Readings from Last 4 Encounters:  06/18/16 49.64 kg/m  05/24/16 50.45 kg/m  03/11/16 48.28 kg/m  02/27/16 48.28 kg/m   Wt Readings from Last 4 Encounters:  06/18/16 (!) 366 lb (166 kg)  05/24/16 (!) 372 lb (168.7 kg)  03/11/16 (!) 356 lb (161.5 kg)  02/27/16 (!) 356 lb (161.5 kg)  Psych/Mental status: Alert, oriented x 3 (person, place, & time)       Eyes: PERLA Respiratory: No evidence of acute respiratory distress  Cervical Spine Exam  Inspection: No masses, redness, or swelling Alignment: Symmetrical Functional ROM: Unrestricted ROM      Stability: No instability detected Muscle strength & Tone: Functionally  intact Sensory: Unimpaired Palpation: No palpable anomalies              Upper Extremity (UE) Exam    Side: Right upper extremity  Side: Left upper extremity  Inspection: No masses, redness, swelling, or asymmetry. No contractures  Inspection: No masses, redness, swelling, or asymmetry. No contractures  Functional ROM: Unrestricted ROM          Functional ROM: Unrestricted ROM  Muscle strength & Tone: Functionally intact  Muscle strength & Tone: Functionally intact  Sensory: Unimpaired  Sensory: Unimpaired  Palpation: No palpable anomalies              Palpation: No palpable anomalies              Specialized Test(s): Deferred         Specialized Test(s): Deferred          Thoracic Spine Exam  Inspection: No masses, redness, or swelling Alignment: Symmetrical Functional ROM: Unrestricted ROM Stability: No instability detected Sensory: Unimpaired Muscle strength & Tone: No palpable anomalies  Lumbar Spine Exam  Inspection: No masses, redness, or swelling Alignment: Symmetrical Functional ROM: Unrestricted ROM      Stability: No instability detected Muscle strength & Tone: Functionally intact Sensory: Unimpaired Palpation: No palpable anomalies       Provocative Tests: Lumbar Hyperextension and rotation test: evaluation deferred today       Patrick's Maneuver: evaluation deferred today                    Gait & Posture Assessment  Ambulation: Unassisted Gait: Relatively normal for age and body habitus Posture: WNL   Lower Extremity Exam    Side: Right lower extremity  Side: Left lower extremity  Inspection: No masses, redness, swelling, or asymmetry. No contractures  Inspection: No masses, redness, swelling, or asymmetry. No contractures  Functional ROM: Unrestricted ROM          Functional ROM: Unrestricted ROM          Muscle strength & Tone: Functionally intact  Muscle strength & Tone: Functionally intact  Sensory: Unimpaired  Sensory: Unimpaired  Palpation: No  palpable anomalies  Palpation: No palpable anomalies   Assessment & Plan  Primary Diagnosis & Pertinent Problem List: The primary encounter diagnosis was Chronic hip pain (Location of Primary Source of Pain) (Bilateral) (R>L). 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  Visit Diagnosis: 1. Chronic hip pain (Location of Primary Source of Pain) (Bilateral) (R>L)   2. Chronic low back pain (Location of Secondary source of pain) (Bilateral) (R>L)   3. Chronic neck pain (Location of Tertiary source of pain) (Midline) (Bilateral)   4. History of bilateral total hip arthroplasty   5. 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 Lumbar spondylosis   7. Lumbar facet arthropathy (HCC)   8. Lumbar facet syndrome (Bilateral) (R>L)   9. Chronic pain of both shoulders (L>R)   10. Cervicogenic headache (B)(L>R)   11. 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 Neurogenic pain   13. Neuropathic pain   14. Medtronic spinal cord stimulator   15. Thoracic facet arthropathy (Bilateral: T7-8, T8-9, and T9-10)   16. Thoracic  foraminal stenosis (Right: T8-9 and T9-10)   17. DDD (  degenerative disc disease), lumbar   18. DDD (degenerative disc disease), cervical   19. DDD (degenerative disc disease), thoracic    Problems updated and reviewed during this visit: Problem  Numbness of upper extremity (Bilateral)  Chronic cervical radiculopathy (Bilateral)  Carpal tunnel syndrome (Bilateral)  Cervical facet syndrome (Bilateral)   Problem-specific Plan(s): No problem-specific Assessment & Plan notes found for this encounter.  Assessment & plan notes cannot be loaded without a specified hospital service.  Plan of Care  Pharmacotherapy (Medications Ordered): No orders of the defined types were placed in this encounter.  Lab-work, procedure(s), and/or referral(s): Orders Placed This Encounter  Procedures  . LUMBAR FACET(MEDIAL BRANCH NERVE BLOCK) MBNB  . CT CERVICAL SPINE WO CONTRAST  . DG Shoulder Left  . DG Shoulder Right    Pharmacotherapy: Opioid Analgesics: We'll take over management today. See above orders Membrane stabilizer: We have discussed the possibility of optimizing this mode of therapy, if tolerated Muscle relaxant: We have discussed the possibility of a trial NSAID: We have discussed the possibility of a trial Other analgesic(s): To be determined at a later time   Interventional therapies: Planned, scheduled, and/or pending:    Diagnostic bilateral lumbar facet block under fluoroscopic guidance and IV sedation vs. Diagnostic bilateral cervical facet block under fluoroscopic guidance and IV sedation    Considering:   Diagnostic Thoracic epidural steroid injection  Diagnostic Lumbar epidural steroid injection  Diagnostic Transforaminal thoracic epidural steroid injection  Diagnostic Transforaminal lumbar epidural steroid injection  Diagnostic Thoracic facet blocks  Possible thoracic facet RFA Diagnostic Bilateral Lumbar facet blocks  Possible Bilateral Lumbar facet RFA Diagnostic  Lumbar sympathetic block  Possible lumbar sympathetic RFA    PRN Procedures:   Diagnostic bilateral cervical facet block under fluoroscopic guidance and IV sedation    Provider-requested follow-up: Return for procedure (w/ sedation):, (ASAP), by MD.  Future Appointments Date Time Provider [employer]  11/22/2016 9:00 AM McGowan, Hunt Oris, PA-C [facility] None    Primary Care Physician: Sofie Hartigan, MD Location: [facility] Note by: Kathlen Brunswick. Dossie Arbour, M.D, DABA, DABAPM, DABPM, DABIPP, FIPP Date: 06/18/2016; Time: 10:09 AM  Patient instructions provided during this appointment: Patient Instructions   Pain Score  Introduction: The pain score used by this practice is the Verbal Numerical Rating Scale (VNRS-11). This is an 11-point scale. It is for adults and children 10 years or older. There are significant differences in how the pain score is reported, used, and applied. Forget everything you learned in the past and learn this scoring system.  General Information: The scale should reflect your current level of pain. Unless you are specifically asked for the level of your worst pain, or your average pain. If you are asked for one of these two, then it should be understood that it is over the past 24 hours.  Basic Activities of Daily Living (ADL): Personal hygiene, dressing, eating, transferring, and using restroom.  Instructions: Most patients tend to report their level of pain as a combination of two factors, their physical pain and their psychosocial pain. This last one is also known as "suffering" and it is reflection of how physical pain affects you socially and psychologically. From now on, report them separately. From this point on, when asked to report your pain level, report only your physical pain. Use the following table for reference.  Pain Clinic Pain Levels (0-5/10)  Pain Level Score Description  No Pain 0   Mild pain 1 Nagging,  annoying, but  does not interfere with basic activities of daily living (ADL). Patients are able to eat, bathe, get dressed, toileting (being able to get on and off the toilet and perform personal hygiene functions), transfer (move in and out of bed or a chair without assistance), and maintain continence (able to control bladder and bowel functions). Blood pressure and heart rate are unaffected. A normal heart rate for a healthy adult ranges from 60 to 100 bpm (beats per minute).   Mild to moderate pain 2 Noticeable and distracting. Impossible to hide from other people. More frequent flare-ups. Still possible to adapt and function close to normal. It can be very annoying and may have occasional stronger flare-ups. With discipline, patients may get used to it and adapt.   Moderate pain 3 Interferes significantly with activities of daily living (ADL). It becomes difficult to feed, bathe, get dressed, get on and off the toilet or to perform personal hygiene functions. Difficult to get in and out of bed or a chair without assistance. Very distracting. With effort, it can be ignored when deeply involved in activities.   Moderately severe pain 4 Impossible to ignore for more than a few minutes. With effort, patients may still be able to manage work or participate in some social activities. Very difficult to concentrate. Signs of autonomic nervous system discharge are evident: dilated pupils (mydriasis); mild sweating (diaphoresis); sleep interference. Heart rate becomes elevated (>115 bpm). Diastolic blood pressure (lower number) rises above 100 mmHg. Patients find relief in laying down and not moving.   Severe pain 5 Intense and extremely unpleasant. Associated with frowning face and frequent crying. Pain overwhelms the senses.  Ability to do any activity or maintain social relationships becomes significantly limited. Conversation becomes difficult. Pacing back and forth is common, as getting into a comfortable  position is nearly impossible. Pain wakes you up from deep sleep. Physical signs will be obvious: pupillary dilation; increased sweating; goosebumps; brisk reflexes; cold, clammy hands and feet; nausea, vomiting or dry heaves; loss of appetite; significant sleep disturbance with inability to fall asleep or to remain asleep. When persistent, significant weight loss is observed due to the complete loss of appetite and sleep deprivation.  Blood pressure and heart rate becomes significantly elevated. Caution: If elevated blood pressure triggers a pounding headache associated with blurred vision, then the patient should immediately seek attention at an urgent or emergency care unit, as these may be signs of an impending stroke.    Emergency Department Pain Levels (6-10/10)  Emergency Room Pain 6 Severely limiting. Requires emergency care and should not be seen or managed at an outpatient pain management facility. Communication becomes difficult and requires great effort. Assistance to reach the emergency department may be required. Facial flushing and profuse sweating along with potentially dangerous increases in heart rate and blood pressure will be evident.   Distressing pain 7 Self-care is very difficult. Assistance is required to transport, or use restroom. Assistance to reach the emergency department will be required. Tasks requiring coordination, such as bathing and getting dressed become very difficult.   Disabling pain 8 Self-care is no longer possible. At this level, pain is disabling. The individual is unable to do even the most "basic" activities such as walking, eating, bathing, dressing, transferring to a bed, or toileting. Fine motor skills are lost. It is difficult to think clearly.   Incapacitating pain 9 Pain becomes incapacitating. Thought processing is no longer possible. Difficult to remember your own name. Control of movement and coordination are  lost.   The worst pain imaginable 10 At  this level, most patients pass out from pain. When this level is reached, collapse of the autonomic nervous system occurs, leading to a sudden drop in blood pressure and heart rate. This in turn results in a temporary and dramatic drop in blood flow to the brain, leading to a loss of consciousness. Fainting is one of the body's self defense mechanisms. Passing out puts the brain in a calmed state and causes it to shut down for a while, in order to begin the healing process.    Summary: 1. Refer to this scale when providing [place] with your pain level. 2. Be accurate and careful when reporting your pain level. This will help with your care. 3. Over-reporting your pain level will lead to loss of credibility. 4. Even a level of 1/10 means that there is pain and will be treated at our facility. 5. High, inaccurate reporting will be documented as "Symptom Exaggeration", leading to loss of credibility and suspicions of possible secondary gains such as obtaining more narcotics, or wanting to appear disabled, for fraudulent reasons. 6. Only pain levels of 5 or below will be seen at our facility. 7. Pain levels of 6 and above will be sent to the Emergency Department and the appointment cancelled. _____________________________________________________________________________________________  ____________________________________________________________________________________________  Appointment Policy  It is our goal and responsibility to provide the medical community with assistance in the evaluation and management of patients with chronic pain. Unfortunately our resources are limited. Because we do not have an unlimited amount of time, or available appointments, we are required to closely monitor and manage their use. The following rules exist to maximize their use:  Patient's responsibilities: 1. Punctuality: You are required to be physically present and registered in our facility at least 30 minutes before  your appointment. 2. Tardiness: The cutoff is your appointment time. If you have an appointment scheduled for 10:00 AM and you arrive at 10:01, you will be required to reschedule your appointment.  3. Plan ahead: Always assume that you will encounter traffic on your way in. Plan for it. If you are dependent on a driver, make sure they understand these rules and the need to arrive early. 4. Other appointments and responsibilities: Avoid scheduling any other appointments before or after your pain clinic appointments.  5. Be prepared: Write down everything that you need to discuss with your healthcare provider and give this information to the admitting nurse. Write down the medications that you will need refilled. Bring your pills and bottles (even the empty ones), to all of your appointments, except for those where a procedure is scheduled. 6. No children or pets: Find someone to take care of them. It is not appropriate to bring them in. 7. Scheduling changes: We request "advanced notification" of any changes or cancellations. 8. Advanced notification: Defined as a time period of more than 24 hours prior to the originally scheduled appointment. This allows for the appointment to be offered to other patients. 9. Rescheduling: When a visit is rescheduled, it will require the cancellation of the original appointment. For this reason they both fall within the category of "Cancellations".  10. Cancellations: They require advanced notification. Any cancellation less than 24 hours before the  appointment will be recorded as a "No Show". 11. No Show: Defined as an unkept appointment where the patient failed to notify or declare to the practice their intention or inability to keep the appointment.  Corrective process for repeat offenders:  1. Tardiness: Three (3) episodes of rescheduling due to late arrivals will be recorded as one (1) "No Show". 2. Cancellation or reschedule: Three (3) cancellations or  rescheduling will be recorded as one (1) "No Show". 3. "No Shows": Three (3) "No Shows" within a 12 month period will result in discharge from the practice.  _____________________________________________________________________________________________   Preparing for Procedure with Sedation Instructions: . Oral Intake: Do not eat or drink anything for at least 8 hours prior to your procedure. . Transportation: Public transportation is not allowed. Bring an adult driver. The driver must be physically present in our waiting room before any procedure can be started. Marland Kitchen Physical Assistance: Bring an adult capable of physically assisting you, in the event you need help. . Blood Pressure Medicine: Take your blood pressure medicine with a sip of water the morning of the procedure. . Insulin: Take only  of your normal insulin dose. . Preventing infections: Shower with an antibacterial soap the morning of your procedure. . Build-up your immune system: Take 1000 mg of Vitamin C with every meal (3 times a day) the day prior to your procedure. . Pregnancy: If you are pregnant, call and cancel the procedure. . Sickness: If you have a cold, fever, or any active infections, call and cancel the procedure. . Arrival: You must be in the facility at least 30 minutes prior to your scheduled procedure. . Children: Do not bring children with you. . Dress appropriately: Bring dark clothing that you would not mind if they get stained. . Valuables: Do not bring any jewelry or valuables. Procedure appointments are reserved for interventional treatments only. Marland Kitchen No Prescription Refills. . No medication changes will be discussed during procedure appointments. No disability issues will be discussed.Facet Blocks Patient Information  Description: The facets are joints in the spine between the vertebrae.  Like any joints in the body, facets can become irritated and painful.  Arthritis can also effect the facets.  By  injecting steroids and local anesthetic in and around these joints, we can temporarily block the nerve supply to them.  Steroids act directly on irritated nerves and tissues to reduce selling and inflammation which often leads to decreased pain.  Facet blocks may be done anywhere along the spine from the neck to the low back depending upon the location of your pain.   After numbing the skin with local anesthetic (like Novocaine), a small needle is passed onto the facet joints under x-ray guidance.  You may experience a sensation of pressure while this is being done.  The entire block usually lasts about 15-25 minutes.   Conditions which may be treated by facet blocks:   Low back/buttock pain  Neck/shoulder pain  Certain types of headaches  Preparation for the injection:  12. Do not eat any solid food or dairy products within 8 hours of your appointment. 13. You may drink clear liquid up to 3 hours before appointment.  Clear liquids include water, black coffee, juice or soda.  No milk or cream please. 14. You may take your regular medication, including pain medications, with a sip of water before your appointment.  Diabetics should hold regular insulin (if taken separately) and take 1/2 normal NPH dose the morning of the procedure.  Carry some sugar containing items with you to your appointment. 15. A driver must accompany you and be prepared to drive you home after your procedure. 40. Bring all your current medications with you. 17. An IV may be inserted and sedation may  be given at the discretion of the physician. 18. A blood pressure cuff, EKG and other monitors will often be applied during the procedure.  Some patients may need to have extra oxygen administered for a short period. 15. You will be asked to provide medical information, including your allergies and medications, prior to the procedure.  We must know immediately if you are taking blood thinners (like Coumadin/Warfarin) or if you  are allergic to IV iodine contrast (dye).  We must know if you could possible be pregnant.  Possible side-effects:   Bleeding from needle site  Infection (rare, may require surgery)  Nerve injury (rare)  Numbness & tingling (temporary)  Difficulty urinating (rare, temporary)  Spinal headache (a headache worse with upright posture)  Light-headedness (temporary)  Pain at injection site (serveral days)  Decreased blood pressure (rare, temporary)  Weakness in arm/leg (temporary)  Pressure sensation in back/neck (temporary)   Call if you experience:   Fever/chills associated with headache or increased back/neck pain  Headache worsened by an upright position  New onset, weakness or numbness of an extremity below the injection site  Hives or difficulty breathing (go to the emergency room)  Inflammation or drainage at the injection site(s)  Severe back/neck pain greater than usual  New symptoms which are concerning to you  Please note:  Although the local anesthetic injected can often make your back or neck feel good for several hours after the injection, the pain will likely return. It takes 3-7 days for steroids to work.  You may not notice any pain relief for at least one week.  If effective, we will often do a series of 2-3 injections spaced 3-6 weeks apart to maximally decrease your pain.  After the initial series, you may be a candidate for a more permanent nerve block of the facets.  If you have any questions, please call #[phone] [facility]

## 2016-07-17 ENCOUNTER — Ambulatory Visit: Payer: Medicare Other

## 2016-07-18 ENCOUNTER — Ambulatory Visit
Admission: RE | Admit: 2016-07-18 | Discharge: 2016-07-18 | Disposition: A | Discharge status: Home / Self Care | Payer: Medicare Other | Source: Ambulatory Visit | Attending: Pain Medicine | Admitting: Pain Medicine

## 2016-07-18 DIAGNOSIS — M47812 Spondylosis without myelopathy or radiculopathy, cervical region: Secondary | ICD-10-CM | POA: Diagnosis not present

## 2016-07-18 DIAGNOSIS — M25512 Pain in left shoulder: Secondary | ICD-10-CM | POA: Diagnosis not present

## 2016-07-18 DIAGNOSIS — M25511 Pain in right shoulder: Secondary | ICD-10-CM | POA: Insufficient documentation

## 2016-07-18 DIAGNOSIS — G8929 Other chronic pain: Secondary | ICD-10-CM | POA: Diagnosis not present

## 2016-07-18 DIAGNOSIS — Z981 Arthrodesis status: Secondary | ICD-10-CM | POA: Diagnosis not present

## 2016-07-18 DIAGNOSIS — R51 Headache: Secondary | ICD-10-CM | POA: Diagnosis not present

## 2016-07-18 DIAGNOSIS — M542 Cervicalgia: Secondary | ICD-10-CM | POA: Insufficient documentation

## 2016-07-18 DIAGNOSIS — G4486 Cervicogenic headache: Secondary | ICD-10-CM

## 2016-07-19 ENCOUNTER — Encounter: Payer: Self-pay | Admitting: Pain Medicine

## 2016-07-30 ENCOUNTER — Other Ambulatory Visit: Payer: Self-pay | Admitting: Pain Medicine

## 2016-07-30 ENCOUNTER — Telehealth: Payer: Self-pay

## 2016-07-30 DIAGNOSIS — R200 Anesthesia of skin: Secondary | ICD-10-CM | POA: Insufficient documentation

## 2016-07-30 DIAGNOSIS — M5412 Radiculopathy, cervical region: Secondary | ICD-10-CM | POA: Insufficient documentation

## 2016-07-30 DIAGNOSIS — G5603 Carpal tunnel syndrome, bilateral upper limbs: Secondary | ICD-10-CM

## 2016-07-30 DIAGNOSIS — M25511 Pain in right shoulder: Secondary | ICD-10-CM

## 2016-07-30 DIAGNOSIS — M25512 Pain in left shoulder: Secondary | ICD-10-CM

## 2016-07-30 DIAGNOSIS — M47812 Spondylosis without myelopathy or radiculopathy, cervical region: Secondary | ICD-10-CM

## 2016-07-30 DIAGNOSIS — M5030 Other cervical disc degeneration, unspecified cervical region: Secondary | ICD-10-CM

## 2016-07-30 DIAGNOSIS — G8929 Other chronic pain: Secondary | ICD-10-CM

## 2016-07-30 DIAGNOSIS — M542 Cervicalgia: Secondary | ICD-10-CM

## 2016-07-30 NOTE — Telephone Encounter (Signed)
Please schedule bilateral cervical facet block and nerve conduction test. Dr. Dossie Arbour put orders in. Pre procedure instructions given to patient.

## 2016-07-30 NOTE — Telephone Encounter (Signed)
Patient is having pain from neck to lower back. He wants a procedure but there is no order for one. Please let me know what procedure to schedule.

## 2016-07-30 NOTE — Telephone Encounter (Signed)
Pain in the neck, to both shoulders.

## 2016-08-05 ENCOUNTER — Ambulatory Visit: Payer: Medicare Other | Admitting: Pain Medicine

## 2016-08-07 ENCOUNTER — Ambulatory Visit (HOSPITAL_BASED_OUTPATIENT_CLINIC_OR_DEPARTMENT_OTHER): Payer: Medicare Other | Admitting: Pain Medicine

## 2016-08-07 ENCOUNTER — Encounter: Payer: Self-pay | Admitting: Pain Medicine

## 2016-08-07 ENCOUNTER — Ambulatory Visit
Admission: RE | Admit: 2016-08-07 | Discharge: 2016-08-07 | Disposition: A | Discharge status: Home / Self Care | Payer: Medicare Other | Source: Ambulatory Visit | Attending: Pain Medicine | Admitting: Pain Medicine

## 2016-08-07 VITALS — BP 113/77 | HR 64 | Temp 97.6°F | Resp 18 | Ht 72.0 in | Wt 369.0 lb

## 2016-08-07 DIAGNOSIS — M5382 Other specified dorsopathies, cervical region: Secondary | ICD-10-CM

## 2016-08-07 DIAGNOSIS — M47812 Spondylosis without myelopathy or radiculopathy, cervical region: Secondary | ICD-10-CM | POA: Diagnosis not present

## 2016-08-07 DIAGNOSIS — M542 Cervicalgia: Secondary | ICD-10-CM | POA: Diagnosis not present

## 2016-08-07 DIAGNOSIS — M5030 Other cervical disc degeneration, unspecified cervical region: Secondary | ICD-10-CM | POA: Insufficient documentation

## 2016-08-07 DIAGNOSIS — M4722 Other spondylosis with radiculopathy, cervical region: Secondary | ICD-10-CM

## 2016-08-07 DIAGNOSIS — M4692 Unspecified inflammatory spondylopathy, cervical region: Secondary | ICD-10-CM

## 2016-08-07 DIAGNOSIS — G8929 Other chronic pain: Secondary | ICD-10-CM | POA: Diagnosis present

## 2016-08-07 MED ORDER — ROPIVACAINE HCL 2 MG/ML IJ SOLN
9.0000 mL | Freq: Once | INTRAMUSCULAR | Status: AC
  Administered 2016-08-07: 9 mL via PERINEURAL

## 2016-08-07 MED ORDER — MIDAZOLAM HCL 5 MG/5ML IJ SOLN
1.0000 mg | INTRAMUSCULAR | Status: DC | PRN
  Administered 2016-08-07: 5 mg via INTRAVENOUS

## 2016-08-07 MED ORDER — FENTANYL CITRATE (PF) 100 MCG/2ML IJ SOLN
25.0000 ug | INTRAMUSCULAR | Status: DC | PRN
  Administered 2016-08-07: 100 ug via INTRAVENOUS

## 2016-08-07 MED ORDER — LIDOCAINE HCL (PF) 1.5 % IJ SOLN
20.0000 mL | Freq: Once | INTRAMUSCULAR | Status: DC
  Filled 2016-08-07: qty 20

## 2016-08-07 MED ORDER — TRIAMCINOLONE ACETONIDE 40 MG/ML IJ SUSP
40.0000 mg | Freq: Once | INTRAMUSCULAR | Status: AC
  Administered 2016-08-07: 40 mg

## 2016-08-07 MED ORDER — MIDAZOLAM HCL 5 MG/5ML IJ SOLN
INTRAMUSCULAR | Status: AC
  Filled 2016-08-07: qty 5

## 2016-08-07 MED ORDER — TRIAMCINOLONE ACETONIDE 40 MG/ML IJ SUSP
INTRAMUSCULAR | Status: AC
  Filled 2016-08-07: qty 2

## 2016-08-07 MED ORDER — LACTATED RINGERS IV SOLN: 1000.0000 mL | Freq: Once | INTRAVENOUS | Status: DC

## 2016-08-07 MED ORDER — ROPIVACAINE HCL 2 MG/ML IJ SOLN
INTRAMUSCULAR | Status: AC
Start: 2016-08-07 — End: ?
  Filled 2016-08-07: qty 20

## 2016-08-07 MED ORDER — FENTANYL CITRATE (PF) 100 MCG/2ML IJ SOLN
INTRAMUSCULAR | Status: AC
  Filled 2016-08-07: qty 2

## 2016-08-07 NOTE — Progress Notes (Signed)
Patient's Name: Richard Duncan  MRN: [id number]  Referring Provider: Milinda Pointer, MD  DOB: Aug 20, 1950  PCP: Sofie Hartigan, MD  DOS: 08/07/2016  Note by: Gaspar Cola, MD  Service setting: Ambulatory outpatient  Specialty: Interventional Pain Management  Patient type: Established  Location: [facility] (AMB) Pain Management Facility  Visit type: Interventional Procedure   Primary Reason for Visit: Interventional Pain Management Treatment. CC: Neck Pain (both sides) and Back Pain (all over)  Procedure:  Anesthesia, Analgesia, Anxiolysis:  Type: Diagnostic Cervical Facet Medial Branch Block(s) Region: Posterolateral cervical spine region Level: C3, C4, C5, C6, & C7 Medial Branch Level(s) Laterality: Bilateral Paraspinal  Type: Local Anesthesia with Moderate (Conscious) Sedation Local Anesthetic: Lidocaine 1% Route: Intravenous (IV) IV Access: Secured Sedation: Meaningful verbal contact was maintained at all times during the procedure  Indication(s): Analgesia and Anxiety  Indications: 1. Cervical facet syndrome (Bilateral)   2. Chronic neck pain (Location of Tertiary source of pain) (Midline) (Bilateral)   3. DDD (degenerative disc disease), cervical   4. Cervical spondylosis    Pain Score: Pre-procedure: 8 /10 Post-procedure: 0-No pain/10  Pre-op Assessment:  Previous date of service: 06/18/16 Service provided: Evaluation Mr. Cuadra is a 66 y.o. (year old), male patient, seen today for interventional treatment. 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 Mr. Grobe has a current medication list which includes the following prescription(s): amlodipine, aspirin ec,  benazepril, vitamin d3, doxycycline, and metoprolol tartrate, and the following Facility-Administered Medications: fentanyl, lactated ringers, lidocaine, and midazolam. His primarily concern today is the Neck Pain (both sides) and Back Pain (all over)  Initial Vital Signs: Blood pressure (!) 143/85, pulse 81, temperature 98.4 F (36.9 C), resp. rate 16, height 6' (1.829 m), weight (!) 369 lb (167.4 kg), SpO2 97 %. BMI: 50.05 kg/m  Risk Assessment: Allergies: Reviewed. He is allergic to propofol and propranolol.  Allergy Precautions: None required Coagulopathies: Reviewed. None identified.  Blood-thinner therapy: None at this time Active Infection(s): Reviewed. None identified. Mr. Letourneau is afebrile  Site Confirmation: Mr. Sensabaugh was asked to confirm the procedure and laterality before marking the site Procedure checklist: Completed Consent: Before the procedure and under the influence of no sedative(s), amnesic(s), or anxiolytics, the patient was informed of the treatment options, risks and possible complications. To fulfill our ethical and legal obligations, as recommended by the American Medical Association's Code of Ethics, I have informed the patient of my clinical impression; the nature and purpose of the treatment or procedure; the risks, benefits, and possible complications of the intervention; the alternatives, including doing nothing; the risk(s) and benefit(s) of the alternative treatment(s) or procedure(s); and the risk(s) and benefit(s) of doing nothing. The patient was provided information about the general risks and possible complications associated with the procedure. These may include, but are not limited to: failure to achieve desired goals, infection, bleeding, organ or nerve damage, allergic reactions, paralysis, and death. In addition, the patient was informed of those risks and complications associated to Spine-related procedures, such as failure to decrease pain; infection  (i.e.: Meningitis, epidural or intraspinal abscess); bleeding (i.e.: epidural hematoma, subarachnoid hemorrhage, or any other type of intraspinal or peri-dural bleeding); organ or nerve damage (i.e.: Any type of peripheral nerve, nerve root, or spinal cord injury) with  subsequent damage to sensory, motor, and/or autonomic systems, resulting in permanent pain, numbness, and/or weakness of one or several areas of the body; allergic reactions; (i.e.: anaphylactic reaction); and/or death. Furthermore, the patient was informed of those risks and complications associated with the medications. These include, but are not limited to: allergic reactions (i.e.: anaphylactic or anaphylactoid reaction(s)); adrenal axis suppression; blood sugar elevation that in diabetics may result in ketoacidosis or comma; water retention that in patients with history of congestive heart failure may result in shortness of breath, pulmonary edema, and decompensation with resultant heart failure; weight gain; swelling or edema; medication-induced neural toxicity; particulate matter embolism and blood vessel occlusion with resultant organ, and/or nervous system infarction; and/or aseptic necrosis of one or more joints. Finally, the patient was informed that Medicine is not an exact science; therefore, there is also the possibility of unforeseen or unpredictable risks and/or possible complications that may result in a catastrophic outcome. The patient indicated having understood very clearly. We have given the patient no guarantees and we have made no promises. Enough time was given to the patient to ask questions, all of which were answered to the patient's satisfaction. Mr. Maclellan has indicated that he wanted to continue with the procedure. Attestation: I, the ordering provider, attest that I have discussed with the patient the benefits, risks, side-effects, alternatives, likelihood of achieving goals, and potential problems during recovery  for the procedure that I have provided informed consent. Date: 08/07/2016; Time: 1:27 PM  Pre-Procedure Preparation:  Monitoring: As per clinic protocol. Respiration, ETCO2, SpO2, BP, heart rate and rhythm monitor placed and checked for adequate function Safety Precautions: Patient was assessed for positional comfort and pressure points before starting the procedure. Time-out: I initiated and conducted the "Time-out" before starting the procedure, as per protocol. The patient was asked to participate by confirming the accuracy of the "Time Out" information. Verification of the correct person, site, and procedure were performed and confirmed by me, the nursing staff, and the patient. "Time-out" conducted as per Joint Commission's Universal Protocol (UP.01.01.01). "Time-out" Date & Time: 08/07/2016; 1342 hrs.  Description of Procedure Process:   Position: Prone with head of the table was raised to facilitate breathing. Target Area: For Cervical Facet blocks, the target is the postero-lateral waist of the articular pillars at the C3, C4, C5, C6, & C7 levels. Approach: Posterior approach. Area Prepped: Entire Posterior Cervico-thoracic Region Prepping solution: ChloraPrep (2% chlorhexidine gluconate and 70% isopropyl alcohol) Safety Precautions: Aspiration looking for blood return was conducted prior to all injections. At no point did we inject any substances, as a needle was being advanced. No attempts were made at seeking any paresthesias. Safe injection practices and needle disposal techniques used. Medications properly checked for expiration dates. SDV (single dose vial) medications used. Description of the Procedure: Protocol guidelines were followed. The patient was placed in position over the fluoroscopy table. The target area was identified and the area prepped in the usual manner. Skin desensitized using vapocoolant spray. Skin & deeper tissues infiltrated with local anesthetic. Appropriate amount  of time allowed to pass for local anesthetics to take effect. The procedure needle was introduced through the skin, ipsilateral to the reported pain, and advanced to the target area. Bone was contacted on the posterior aspect of the articular pillars and the needle walked lateral, until the border was cleared. Lateral views taken to make sure the needle tip did not advance past the posterior third of the lateral mass of the posterior columns. The procedure  was repeated in identical fashion for each level. Negative aspiration confirmed. Solution injected in intermittent fashion, asking for systemic symptoms every 0.5cc of injectate. The needles were then removed and the area cleansed, making sure to leave some of the prepping solution back to take advantage of its long term bactericidal properties. Vitals:   08/07/16 1405 08/07/16 1415 08/07/16 1425 08/07/16 1435  BP: 120/80 127/60 (!) 115/59 113/77  Pulse: 72 67 63 64  Resp: 16 13 14 18   Temp:  97.6 F (36.4 C)    SpO2: 99% 96% 95% 96%  Weight:      Height:        Start Time: 1342 hrs. End Time: 1359 hrs. Materials:  Needle(s) Type: Regular needle Gauge: 22G Length: 3.5-in Medication(s): We administered midazolam, fentaNYL, triamcinolone acetonide, ropivacaine (PF) 2 mg/mL (0.2%), triamcinolone acetonide, and ropivacaine (PF) 2 mg/mL (0.2%). Please see chart orders for dosing details.  Imaging Guidance (Spinal):  Type of Imaging Technique: Fluoroscopy Guidance (Spinal) Indication(s): Assistance in needle guidance and placement for procedures requiring needle placement in or near specific anatomical locations not easily accessible without such assistance. Exposure Time: Please see nurses notes. Contrast: None used. Fluoroscopic Guidance: I was personally present during the use of fluoroscopy. "Tunnel Vision Technique" used to obtain the best possible view of the target area. Parallax error corrected before commencing the procedure.  "Direction-depth-direction" technique used to introduce the needle under continuous pulsed fluoroscopy. Once target was reached, antero-posterior, oblique, and lateral fluoroscopic projection used confirm needle placement in all planes. Images permanently stored in EMR. Interpretation: No contrast injected. I personally interpreted the imaging intraoperatively. Adequate needle placement confirmed in multiple planes. Permanent images saved into the patient's record.  Antibiotic Prophylaxis:  Indication(s): None identified Antibiotic given: None  Post-operative Assessment:  EBL: None Complications: No immediate post-treatment complications observed by team, or reported by patient. Note: The patient tolerated the entire procedure well. A repeat set of vitals were taken after the procedure and the patient was kept under observation following institutional policy, for this type of procedure. Post-procedural neurological assessment was performed, showing return to baseline, prior to discharge. The patient was provided with post-procedure discharge instructions, including a section on how to identify potential problems. Should any problems arise concerning this procedure, the patient was given instructions to immediately contact us, at any time, without hesitation. In any case, we plan to contact the patient by telephone for a follow-up status report regarding this interventional procedure. Comments:  No additional relevant information.  Plan of Care  Disposition: Discharge home  Discharge Date & Time: 08/07/2016; 1435 hrs.  Physician-requested Follow-up:  Return for post-procedure eval (in 2 wks), by MD.  Future Appointments Date Time Provider [place]  09/02/2016 8:00 AM Milinda Pointer, MD [facility] None  11/22/2016 9:00 AM Ernestine Conrad, Larene [place] A, PA-C [facility] None   Medications ordered for procedure: Meds ordered this encounter  Medications  . lactated ringers infusion 1,000 mL  .  midazolam (VERSED) 5 MG/5ML injection 1-2 mg    Make sure Flumazenil is available in the pyxis when using this medication. If oversedation occurs, administer 0.2 mg IV over 15 sec. If after 45 sec no response, administer 0.2 mg again over 1 min; may repeat at 1 min intervals; not to exceed 4 doses (1 mg)  . fentaNYL (SUBLIMAZE) injection 25-50 mcg    Make sure Narcan is available in the pyxis when using this medication. In the event of respiratory depression (RR< 8/min): Titrate NARCAN (naloxone) in increments  of 0.1 to 0.2 mg IV at 2-3 minute intervals, until desired degree of reversal.  . lidocaine 1.5 % injection 20 mL    From block tray  . triamcinolone acetonide (KENALOG-40) injection 40 mg  . ropivacaine (PF) 2 mg/mL (0.2%) (NAROPIN) injection 9 mL  . triamcinolone acetonide (KENALOG-40) injection 40 mg  . ropivacaine (PF) 2 mg/mL (0.2%) (NAROPIN) injection 9 mL   Medications administered: We administered midazolam, fentaNYL, triamcinolone acetonide, ropivacaine (PF) 2 mg/mL (0.2%), triamcinolone acetonide, and ropivacaine (PF) 2 mg/mL (0.2%).  See the medical record for exact dosing, route, and time of administration.  Lab-work, Procedure(s), & Referral(s) Ordered: Orders Placed This Encounter  Procedures  . DG C-Arm 1-60 Min-No Report  . Informed Consent Details: Transcribe to consent form and obtain patient signature  . Provider attestation of informed consent for procedure/surgical case  . Verify informed consent  . Discharge instructions  . Follow-up   Imaging Ordered: New Prescriptions   No medications on file   Primary Care Physician: Sofie Hartigan, MD Location: [facility] Note by: Gaspar Cola, MD Date: 08/07/2016; Time: 6:04 PM  Disclaimer:  Medicine is not an Chief Strategy Officer. The only guarantee in medicine is that nothing is guaranteed. It is important to note that the decision to proceed with this intervention was based on the  information collected from the patient. The Data and conclusions were drawn from the patient's questionnaire, the interview, and the physical examination. Because the information was provided in large part by the patient, it cannot be guaranteed that it has not been purposely or unconsciously manipulated. Every effort has been made to obtain as much relevant data as possible for this evaluation. It is important to note that the conclusions that lead to this procedure are derived in large part from the available data. Always take into account that the treatment will also be dependent on availability of resources and existing treatment guidelines, considered by other Pain Management Practitioners as being common knowledge and practice, at the time of the intervention. For Medico-Legal purposes, it is also important to point out that variation in procedural techniques and pharmacological choices are the acceptable norm. The indications, contraindications, technique, and results of the above procedure should only be interpreted and judged by a Board-Certified Interventional Pain Specialist with extensive familiarity and expertise in the same exact procedure and technique.  Instructions provided at this appointment: Patient Instructions   ____________________________________________________________________________________________  Post-Procedure instructions Instructions:  Apply ice: Fill a plastic sandwich bag with crushed ice. Cover it with a small towel and apply to injection site. Apply for 15 minutes then remove x 15 minutes. Repeat sequence on day of procedure, until you go to bed. The purpose is to minimize swelling and discomfort after procedure.  Apply heat: Apply heat to procedure site starting the day following the procedure. The purpose is to treat any soreness and discomfort from the procedure.  Food intake: Start with clear liquids (like water) and advance to regular food, as tolerated.    Physical activities: Keep activities to a minimum for the first 8 hours after the procedure.   Driving: If you have received any sedation, you are not allowed to drive for 24 hours after your procedure.  Blood thinner: Restart your blood thinner 6 hours after your procedure. (Only for those taking blood thinners)  Insulin: As soon as you can eat, you may resume your normal dosing schedule. (Only for those taking insulin)  Infection prevention: Keep procedure site  clean and dry.  Post-procedure Pain Diary: Extremely important that this be done correctly and accurately. Recorded information will be used to determine the next step in treatment.  Pain evaluated is that of treated area only. Do not include pain from an untreated area.  Complete every hour, on the hour, for the initial 8 hours. Set an alarm to help you do this part accurately.  Do not go to sleep and have it completed later. It will not be accurate.  Follow-up appointment: Keep your follow-up appointment after the procedure. Usually 2 weeks for most procedures. (6 weeks in the case of radiofrequency.) Bring you pain diary.  Expect:  From numbing medicine (AKA: Local Anesthetics): Numbness or decrease in pain.  Onset: Full effect within 15 minutes of injected.  Duration: It will depend on the type of local anesthetic used. On the average, 1 to 8 hours.   From steroids: Decrease in swelling or inflammation. Once inflammation is improved, relief of the pain will follow.  Onset of benefits: Depends on the amount of swelling present. The more swelling, the longer it will take for the benefits to be seen. In some cases, up to 10 days.  Duration: Steroids will stay in the system x 2 weeks. Duration of benefits will depend on multiple posibilities including persistent irritating factors.  From procedure: Some discomfort is to be expected once the numbing medicine wears off. This should be minimal if ice and heat are applied as  instructed. Call if:  You experience numbness and weakness that gets worse with time, as opposed to wearing off.  New onset bowel or bladder incontinence. (Spinal procedures only)  Emergency Numbers:  Durning business hours (Monday - Thursday, 8:00 AM - 4:00 PM) (Friday, 9:00 AM - 12:00 Noon): [phone]  After hours: [phone] ____________________________________________________________________________________________  Pain Management Discharge Instructions  General Discharge Instructions :  If you need to reach your doctor call: Monday-Friday 8:00 am - 4:00 pm at [phone] or toll free [phone].  After clinic hours [phone] to have operator reach doctor.  Bring all of your medication bottles to all your appointments in the pain clinic.  To cancel or reschedule your appointment with Pain Management please remember to call 24 hours in advance to avoid a fee.  Refer to the educational materials which you have been given on: General Risks, I had my Procedure. Discharge Instructions, Post Sedation.  Post Procedure Instructions:  The drugs you were given will stay in your system until tomorrow, so for the next 24 hours you should not drive, make any legal decisions or drink any alcoholic beverages.  You may eat anything you prefer, but it is better to start with liquids then soups and crackers, and gradually work up to solid foods.  Please notify your doctor immediately if you have any unusual bleeding, trouble breathing or pain that is not related to your normal pain.  Depending on the type of procedure that was done, some parts of your body may feel week and/or numb.  This usually clears up by tonight or the next day.  Walk with the use of an assistive device or accompanied by an adult for the 24 hours.  You may use ice on the affected area for the first 24 hours.  Put ice in a Ziploc bag and cover with a towel and place against area 15 minutes on 15 minutes  off.  You may switch to heat after 24 hours. Facet Joint Block The facet joints connect the  bones of the spine (vertebrae). They make it possible for you to bend, twist, and make other movements with your spine. They also keep you from bending too far, twisting too far, and making other excessive movements. A facet joint block is a procedure where a numbing medicine (anesthetic) is injected into a facet joint. Often, a type of anti-inflammatory medicine called a steroid is also injected. A facet joint block may be done to diagnose neck or back pain. If the pain gets better after a facet joint block, it means the pain is probably coming from the facet joint. If the pain does not get better, it means the pain is probably not coming from the facet joint. A facet joint block may also be done to relieve neck or back pain caused by an inflamed facet joint. A facet joint block is only done to relieve pain if the pain does not improve with other methods, such as medicine, exercise programs, and physical therapy. Tell a health care provider about:  Any allergies you have.  All medicines you are taking, including vitamins, herbs, eye drops, creams, and over-the-counter medicines.  Any problems you or family members have had with anesthetic medicines.  Any blood disorders you have.  Any surgeries you have had.  Any medical conditions you have.  Whether you are pregnant or may be pregnant. What are the risks? Generally, this is a safe procedure. However, problems may occur, including:  Bleeding.  Injury to a nerve near the injection site.  Pain at the injection site.  Weakness or numbness in areas controlled by nerves near the injection site.  Infection.  Temporary fluid retention.  Allergic reactions to medicines or dyes.  Injury to other structures or organs near the injection site.  What happens before the procedure?  Follow instructions from your health care provider about eating or  drinking restrictions.  Ask your health care provider about: ? Changing or stopping your regular medicines. This is especially important if you are taking diabetes medicines or blood thinners. ? Taking medicines such as aspirin and ibuprofen. These medicines can thin your blood. Do not take these medicines before your procedure if your health care provider instructs you not to.  Do not take any new dietary supplements or medicines without asking your health care provider first.  Plan to have someone take you home after the procedure. What happens during the procedure?  You may need to remove your clothing and dress in an open-back gown.  The procedure will be done while you are lying on an X-ray table. You will most likely be asked to lie on your stomach, but you may be asked to lie in a different position if an injection will be made in your neck.  Machines will be used to monitor your oxygen levels, heart rate, and blood pressure.  If an injection will be made in your neck, an IV tube will be inserted into one of your veins. Fluids and medicine will flow directly into your body through the IV tube.  The area over the facet joint where the injection will be made will be cleaned with soap. The surrounding skin will be covered with clean drapes.  A numbing medicine (local anesthetic) will be applied to your skin. Your skin may sting or burn for a moment.  A video X-ray machine (fluoroscopy) will be used to locate the joint. In some cases, a CT scan may be used.  A contrast dye may be injected into the facet  joint area to help locate the joint.  When the joint is located, an anesthetic will be injected into the joint through the needle.  Your health care provider will ask you whether you feel pain relief. If you do feel relief, a steroid may be injected to provide pain relief for a longer period of time. If you do not feel relief or feel only partial relief, additional injections of an  anesthetic may be made in other facet joints.  The needle will be removed.  Your skin will be cleaned.  A bandage (dressing) will be applied over each injection site. The procedure may vary among health care providers and hospitals. What happens after the procedure?  You will be observed for 15-30 minutes before being allowed to go home. This information is not intended to replace advice given to you by your health care provider. Make sure you discuss any questions you have with your health care provider. Document Released: 06/05/2006 Document Revised: 02/15/2015 Document Reviewed: 10/10/2014 Elsevier Interactive Patient Education  2018 [place]  Facet Joint Block, Care After Refer to this sheet in the next few weeks. These instructions provide you with information about caring for yourself after your procedure. Your health care provider may also give you more specific instructions. Your treatment has been planned according to current medical practices, but problems sometimes occur. Call your health care provider if you have any problems or questions after your procedure. What can I expect after the procedure? After the procedure, it is common to have:  Some tenderness over the injection sites for 2 days after the procedure.  A temporary increase in blood sugar if you have diabetes.  Follow these instructions at home:  Keep track of the amount of pain relief you feel and how long it lasts.  Take over-the-counter and prescription medicines only as told by your health care provider. You may need to limit pain medicine within the first 4-6 hours after the procedure.  Remove your bandages (dressings) the morning after the procedure.  For the first 24 hours after the procedure: ? Do not apply heat near or over the injection sites. ? Do not take a bath or soak in water, such as in a pool or lake. ? Do not drive or operate heavy machinery unless approved by your health care  provider. ? Avoid activities that require a lot of energy.  If the injection site is tender, try applying ice to the area. To do this: ? Put ice in a plastic bag. ? Place a towel between your skin and the bag. ? Leave the ice on for 20 minutes, 2-3 times a day.  Keep all follow-up visits as told by your health care provider. This is important. Contact a health care provider if:  Fluid is coming from an injection site.  There is significant bleeding or swelling at an injection site.  You have diabetes and your blood sugar is above 180 mg/dL. Get help right away if:  You have a fever.  You have worsening pain or swelling around an injection site.  There are red streaks around an injection site.  You develop severe pain that is not controlled by your medicines.  You develop a headache, stiff neck, nausea, or vomiting.  Your eyes become very sensitive to light.  You have weakness, paralysis, or tingling in your arms or legs that was not present before the procedure.  You have difficulty urinating or breathing. This information is not intended to replace advice  given to you by your health care provider. Make sure you discuss any questions you have with your health care provider. Document Released: 01/01/2012 Document Revised: 05/31/2015 Document Reviewed: 10/10/2014 [employer] Interactive Patient Education  2018 [employer]  What are the risk, side effects and possible complications? Generally speaking, most procedures are safe.  However, with any procedure there are risks, side effects, and the possibility of complications.  The risks and complications are dependent upon the sites that are lesioned, or the type of nerve block to be performed.  The closer the procedure is to the spine, the more serious the risks are.  Great care is taken when placing the radio frequency needles, block needles or lesioning probes, but sometimes complications can  occur. 1. Infection: Any time there is an injection through the skin, there is a risk of infection.  This is why sterile conditions are used for these blocks.  There are four possible types of infection. 1. Localized skin infection. 2. Central Nervous System Infection-This can be in the form of Meningitis, which can be deadly. 3. Epidural Infections-This can be in the form of an epidural abscess, which can cause pressure inside of the spine, causing compression of the spinal cord with subsequent paralysis. This would require an emergency surgery to decompress, and there are no guarantees that the patient would recover from the paralysis. 4. Discitis-This is an infection of the intervertebral discs.  It occurs in about 1% of discography procedures.  It is difficult to treat and it may lead to surgery.        2. Pain: the needles have to go through skin and soft tissues, will cause soreness.       3. Damage to internal structures:  The nerves to be lesioned may be near blood vessels or    other nerves which can be potentially damaged.       4. Bleeding: Bleeding is more common if the patient is taking blood thinners such as  aspirin, Coumadin, Ticiid, Plavix, etc., or if he/she have some genetic predisposition  such as hemophilia. Bleeding into the spinal canal can cause compression of the spinal  cord with subsequent paralysis.  This would require an emergency surgery to  decompress and there are no guarantees that the patient would recover from the  paralysis.       5. Pneumothorax:  Puncturing of a lung is a possibility, every time a needle is introduced in  the area of the chest or upper back.  Pneumothorax refers to free air around the  collapsed lung(s), inside of the thoracic cavity (chest cavity).  Another two possible  complications related to a similar event would include: Hemothorax and Chylothorax.   These are variations of the Pneumothorax, where instead of air around the collapsed  lung(s),  you may have blood or chyle, respectively.       6. Spinal headaches: They may occur with any procedures in the area of the spine.       7. Persistent CSF (Cerebro-Spinal Fluid) leakage: This is a rare problem, but may occur  with prolonged intrathecal or epidural catheters either due to the formation of a fistulous  track or a dural tear.       8. Nerve damage: By working so close to the spinal cord, there is always a possibility of  nerve damage, which could be as serious as a permanent spinal cord injury with  paralysis.  9. Death:  Although rare, severe deadly allergic reactions known as "Anaphylactic  reaction" can occur to any of the medications used.      10. Worsening of the symptoms:  We can always make thing worse.  What are the chances of something like this happening? Chances of any of this occuring are extremely low.  By statistics, you have more of a chance of getting killed in a motor vehicle accident: while driving to the hospital than any of the above occurring .  Nevertheless, you should be aware that they are possibilities.  In general, it is similar to taking a shower.  Everybody knows that you can slip, hit your head and get killed.  Does that mean that you should not shower again?  Nevertheless always keep in mind that statistics do not mean anything if you happen to be on the wrong side of them.  Even if a procedure has a 1 (one) in a 1,000,000 (million) chance of going wrong, it you happen to be that one..Also, keep in mind that by statistics, you have more of a chance of having something go wrong when taking medications.  Who should not have this procedure? If you are on a blood thinning medication (e.g. Coumadin, Plavix, see list of "Blood Thinners"), or if you have an active infection going on, you should not have the procedure.  If you are taking any blood thinners, please inform your physician.  How should I prepare for this procedure?  Do not eat or drink anything at  least six hours prior to the procedure.  Bring a driver with you .  It cannot be a taxi.  Come accompanied by an adult that can drive you back, and that is strong enough to help you if your legs get weak or numb from the local anesthetic.  Take all of your medicines the morning of the procedure with just enough water to swallow them.  If you have diabetes, make sure that you are scheduled to have your procedure done first thing in the morning, whenever possible.  If you have diabetes, take only half of your insulin dose and notify our nurse that you have done so as soon as you arrive at the clinic.  If you are diabetic, but only take blood sugar pills (oral hypoglycemic), then do not take them on the morning of your procedure.  You may take them after you have had the procedure.  Do not take aspirin or any aspirin-containing medications, at least eleven (11) days prior to the procedure.  They may prolong bleeding.  Wear loose fitting clothing that may be easy to take off and that you would not mind if it got stained with Betadine or blood.  Do not wear any jewelry or perfume  Remove any nail coloring.  It will interfere with some of our monitoring equipment.  NOTE: Remember that this is not meant to be interpreted as a complete list of all possible complications.  Unforeseen problems may occur.  BLOOD THINNERS The following drugs contain aspirin or other products, which can cause increased bleeding during surgery and should not be taken for 2 weeks prior to and 1 week after surgery.  If you should need take something for relief of minor pain, you may take acetaminophen which is found in Tylenol,m Datril, Anacin-3 and Panadol. It is not blood thinner. The products listed below are.  Do not take any of the products listed below in addition to any listed on your  instruction sheet.  A.P.C or A.P.C with Codeine Codeine Phosphate Capsules #3 Ibuprofen Ridaura  ABC compound Congesprin Imuran  rimadil  Advil Cope Indocin Robaxisal  Alka-Seltzer Effervescent Pain Reliever and Antacid Coricidin or Coricidin-D  Indomethacin Rufen  Alka-Seltzer plus Cold Medicine Cosprin Ketoprofen S-A-C Tablets  Anacin Analgesic Tablets or Capsules Coumadin Korlgesic Salflex  Anacin Extra Strength Analgesic tablets or capsules CP-2 Tablets Lanoril Salicylate  Anaprox Cuprimine Capsules Levenox Salocol  Anexsia-D Dalteparin Magan Salsalate  Anodynos Darvon compound Magnesium Salicylate Sine-off  Ansaid Dasin Capsules Magsal Sodium Salicylate  Anturane Depen Capsules Marnal Soma  APF Arthritis pain formula Dewitt's Pills Measurin Stanback  Argesic Dia-Gesic Meclofenamic Sulfinpyrazone  Arthritis Bayer Timed Release Aspirin Diclofenac Meclomen Sulindac  Arthritis pain formula Anacin Dicumarol Medipren Supac  Analgesic (Safety coated) Arthralgen Diffunasal Mefanamic Suprofen  Arthritis Strength Bufferin Dihydrocodeine Mepro Compound Suprol  Arthropan liquid Dopirydamole Methcarbomol with Aspirin Synalgos  ASA tablets/Enseals Disalcid Micrainin Tagament  Ascriptin Doan's Midol Talwin  Ascriptin A/D Dolene Mobidin Tanderil  Ascriptin Extra Strength Dolobid Moblgesic Ticlid  Ascriptin with Codeine Doloprin or Doloprin with Codeine Momentum Tolectin  Asperbuf Duoprin Mono-gesic Trendar  Aspergum Duradyne Motrin or Motrin IB Triminicin  Aspirin plain, buffered or enteric coated Durasal Myochrisine Trigesic  Aspirin Suppositories Easprin Nalfon Trillsate  Aspirin with Codeine Ecotrin Regular or Extra Strength Naprosyn Uracel  Atromid-S Efficin Naproxen Ursinus  Auranofin Capsules Elmiron Neocylate Vanquish  Axotal Emagrin Norgesic Verin  Azathioprine Empirin or Empirin with Codeine Normiflo Vitamin E  Azolid Emprazil Nuprin Voltaren  Bayer Aspirin plain, buffered or children's or timed BC Tablets or powders Encaprin Orgaran Warfarin Sodium  Buff-a-Comp Enoxaparin Orudis Zorpin  Buff-a-Comp with  Codeine Equegesic Os-Cal-Gesic   Buffaprin Excedrin plain, buffered or Extra Strength Oxalid   Bufferin Arthritis Strength Feldene Oxphenbutazone   Bufferin plain or Extra Strength Feldene Capsules Oxycodone with Aspirin   Bufferin with Codeine Fenoprofen Fenoprofen Pabalate or Pabalate-SF   Buffets II Flogesic Panagesic   Buffinol plain or Extra Strength Florinal or Florinal with Codeine Panwarfarin   Buf-Tabs Flurbiprofen Penicillamine   Butalbital Compound Four-way cold tablets Penicillin   Butazolidin Fragmin Pepto-Bismol   Carbenicillin Geminisyn Percodan   Carna Arthritis Reliever Geopen Persantine   Carprofen Gold's salt Persistin   Chloramphenicol Goody's Phenylbutazone   Chloromycetin Haltrain Piroxlcam   Clmetidine heparin Plaquenil   Cllnoril Hyco-pap Ponstel   Clofibrate Hydroxy chloroquine Propoxyphen         Before stopping any of these medications, be sure to consult the physician who ordered them.  Some, such as Coumadin (Warfarin) are ordered to prevent or treat serious conditions such as "deep thrombosis", "pumonary embolisms", and other heart problems.  The amount of time that you may need off of the medication may also vary with the medication and the reason for which you were taking it.  If you are taking any of these medications, please make sure you notify your pain physician before you undergo any procedures.

## 2016-08-07 NOTE — Progress Notes (Signed)
Safety precautions to be maintained throughout the outpatient stay will include: orient to surroundings, keep bed in low position, maintain call bell within reach at all times, provide assistance with transfer out of bed and ambulation.  

## 2016-08-07 NOTE — Patient Instructions (Addendum)
____________________________________________________________________________________________  Post-Procedure instructions Instructions:  Apply ice: Fill a plastic sandwich bag with crushed ice. Cover it with a small towel and apply to injection site. Apply for 15 minutes then remove x 15 minutes. Repeat sequence on day of procedure, until you go to bed. The purpose is to minimize swelling and discomfort after procedure.  Apply heat: Apply heat to procedure site starting the day following the procedure. The purpose is to treat any soreness and discomfort from the procedure.  Food intake: Start with clear liquids (like water) and advance to regular food, as tolerated.   Physical activities: Keep activities to a minimum for the first 8 hours after the procedure.   Driving: If you have received any sedation, you are not allowed to drive for 24 hours after your procedure.  Blood thinner: Restart your blood thinner 6 hours after your procedure. (Only for those taking blood thinners)  Insulin: As soon as you can eat, you may resume your normal dosing schedule. (Only for those taking insulin)  Infection prevention: Keep procedure site clean and dry.  Post-procedure Pain Diary: Extremely important that this be done correctly and accurately. Recorded information will be used to determine the next step in treatment.  Pain evaluated is that of treated area only. Do not include pain from an untreated area.  Complete every hour, on the hour, for the initial 8 hours. Set an alarm to help you do this part accurately.  Do not go to sleep and have it completed later. It will not be accurate.  Follow-up appointment: Keep your follow-up appointment after the procedure. Usually 2 weeks for most procedures. (6 weeks in the case of radiofrequency.) Bring you pain diary.  Expect:  From numbing medicine (AKA: Local Anesthetics): Numbness or decrease in pain.  Onset: Full effect within 15 minutes of  injected.  Duration: It will depend on the type of local anesthetic used. On the average, 1 to 8 hours.   From steroids: Decrease in swelling or inflammation. Once inflammation is improved, relief of the pain will follow.  Onset of benefits: Depends on the amount of swelling present. The more swelling, the longer it will take for the benefits to be seen. In some cases, up to 10 days.  Duration: Steroids will stay in the system x 2 weeks. Duration of benefits will depend on multiple posibilities including persistent irritating factors.  From procedure: Some discomfort is to be expected once the numbing medicine wears off. This should be minimal if ice and heat are applied as instructed. Call if:  You experience numbness and weakness that gets worse with time, as opposed to wearing off.  New onset bowel or bladder incontinence. (Spinal procedures only)  Emergency Numbers:  Durning business hours (Monday - Thursday, 8:00 AM - 4:00 PM) (Friday, 9:00 AM - 12:00 Noon): [phone]  After hours: [phone] ____________________________________________________________________________________________  Pain Management Discharge Instructions  General Discharge Instructions :  If you need to reach your doctor call: Monday-Friday 8:00 am - 4:00 pm at [phone] or toll free [phone].  After clinic hours [phone] to have operator reach doctor.  Bring all of your medication bottles to all your appointments in the pain clinic.  To cancel or reschedule your appointment with Pain Management please remember to call 24 hours in advance to avoid a fee.  Refer to the educational materials which you have been given on: General Risks, I had my Procedure. Discharge Instructions, Post Sedation.  Post Procedure Instructions:  The drugs you   were given will stay in your system until tomorrow, so for the next 24 hours you should not drive, make any legal decisions or drink any alcoholic  beverages.  You may eat anything you prefer, but it is better to start with liquids then soups and crackers, and gradually work up to solid foods.  Please notify your doctor immediately if you have any unusual bleeding, trouble breathing or pain that is not related to your normal pain.  Depending on the type of procedure that was done, some parts of your body may feel week and/or numb.  This usually clears up by tonight or the next day.  Walk with the use of an assistive device or accompanied by an adult for the 24 hours.  You may use ice on the affected area for the first 24 hours.  Put ice in a Ziploc bag and cover with a towel and place against area 15 minutes on 15 minutes off.  You may switch to heat after 24 hours. Facet Joint Block The facet joints connect the bones of the spine (vertebrae). They make it possible for you to bend, twist, and make other movements with your spine. They also keep you from bending too far, twisting too far, and making other excessive movements. A facet joint block is a procedure where a numbing medicine (anesthetic) is injected into a facet joint. Often, a type of anti-inflammatory medicine called a steroid is also injected. A facet joint block may be done to diagnose neck or back pain. If the pain gets better after a facet joint block, it means the pain is probably coming from the facet joint. If the pain does not get better, it means the pain is probably not coming from the facet joint. A facet joint block may also be done to relieve neck or back pain caused by an inflamed facet joint. A facet joint block is only done to relieve pain if the pain does not improve with other methods, such as medicine, exercise programs, and physical therapy. Tell a health care provider about:  Any allergies you have.  All medicines you are taking, including vitamins, herbs, eye drops, creams, and over-the-counter medicines.  Any problems you or family members have had with  anesthetic medicines.  Any blood disorders you have.  Any surgeries you have had.  Any medical conditions you have.  Whether you are pregnant or may be pregnant. What are the risks? Generally, this is a safe procedure. However, problems may occur, including:  Bleeding.  Injury to a nerve near the injection site.  Pain at the injection site.  Weakness or numbness in areas controlled by nerves near the injection site.  Infection.  Temporary fluid retention.  Allergic reactions to medicines or dyes.  Injury to other structures or organs near the injection site.  What happens before the procedure?  Follow instructions from your health care provider about eating or drinking restrictions.  Ask your health care provider about: ? Changing or stopping your regular medicines. This is especially important if you are taking diabetes medicines or blood thinners. ? Taking medicines such as aspirin and ibuprofen. These medicines can thin your blood. Do not take these medicines before your procedure if your health care provider instructs you not to.  Do not take any new dietary supplements or medicines without asking your health care provider first.  Plan to have someone take you home after the procedure. What happens during the procedure?  You may need to remove your   clothing and dress in an open-back gown.  The procedure will be done while you are lying on an X-ray table. You will most likely be asked to lie on your stomach, but you may be asked to lie in a different position if an injection will be made in your neck.  Machines will be used to monitor your oxygen levels, heart rate, and blood pressure.  If an injection will be made in your neck, an IV tube will be inserted into one of your veins. Fluids and medicine will flow directly into your body through the IV tube.  The area over the facet joint where the injection will be made will be cleaned with soap. The surrounding skin  will be covered with clean drapes.  A numbing medicine (local anesthetic) will be applied to your skin. Your skin may sting or burn for a moment.  A video X-ray machine (fluoroscopy) will be used to locate the joint. In some cases, a CT scan may be used.  A contrast dye may be injected into the facet joint area to help locate the joint.  When the joint is located, an anesthetic will be injected into the joint through the needle.  Your health care provider will ask you whether you feel pain relief. If you do feel relief, a steroid may be injected to provide pain relief for a longer period of time. If you do not feel relief or feel only partial relief, additional injections of an anesthetic may be made in other facet joints.  The needle will be removed.  Your skin will be cleaned.  A bandage (dressing) will be applied over each injection site. The procedure may vary among health care providers and hospitals. What happens after the procedure?  You will be observed for 15-30 minutes before being allowed to go home. This information is not intended to replace advice given to you by your health care provider. Make sure you discuss any questions you have with your health care provider. Document Released: 06/05/2006 Document Revised: 02/15/2015 Document Reviewed: 10/10/2014 Elsevier Interactive Patient Education  2018 [employer]  Facet Joint Block, Care After Refer to this sheet in the next few weeks. These instructions provide you with information about caring for yourself after your procedure. Your health care provider may also give you more specific instructions. Your treatment has been planned according to current medical practices, but problems sometimes occur. Call your health care provider if you have any problems or questions after your procedure. What can I expect after the procedure? After the procedure, it is common to have:  Some tenderness over the injection sites for 2 days  after the procedure.  A temporary increase in blood sugar if you have diabetes.  Follow these instructions at home:  Keep track of the amount of pain relief you feel and how long it lasts.  Take over-the-counter and prescription medicines only as told by your health care provider. You may need to limit pain medicine within the first 4-6 hours after the procedure.  Remove your bandages (dressings) the morning after the procedure.  For the first 24 hours after the procedure: ? Do not apply heat near or over the injection sites. ? Do not take a bath or soak in water, such as in a pool or lake. ? Do not drive or operate heavy machinery unless approved by your health care provider. ? Avoid activities that require a lot of energy.  If the injection site is tender, try applying ice   to the area. To do this: ? Put ice in a plastic bag. ? Place a towel between your skin and the bag. ? Leave the ice on for 20 minutes, 2-3 times a day.  Keep all follow-up visits as told by your health care provider. This is important. Contact a health care provider if:  Fluid is coming from an injection site.  There is significant bleeding or swelling at an injection site.  You have diabetes and your blood sugar is above 180 mg/dL. Get help right away if:  You have a fever.  You have worsening pain or swelling around an injection site.  There are red streaks around an injection site.  You develop severe pain that is not controlled by your medicines.  You develop a headache, stiff neck, nausea, or vomiting.  Your eyes become very sensitive to light.  You have weakness, paralysis, or tingling in your arms or legs that was not present before the procedure.  You have difficulty urinating or breathing. This information is not intended to replace advice given to you by your health care provider. Make sure you discuss any questions you have with your health care provider. Document Released: 01/01/2012  Document Revised: 05/31/2015 Document Reviewed: 10/10/2014 Elsevier Interactive Patient Education  2018 [employer]  What are the risk, side effects and possible complications? Generally speaking, most procedures are safe.  However, with any procedure there are risks, side effects, and the possibility of complications.  The risks and complications are dependent upon the sites that are lesioned, or the type of nerve block to be performed.  The closer the procedure is to the spine, the more serious the risks are.  Great care is taken when placing the radio frequency needles, block needles or lesioning probes, but sometimes complications can occur. 1. Infection: Any time there is an injection through the skin, there is a risk of infection.  This is why sterile conditions are used for these blocks.  There are four possible types of infection. 1. Localized skin infection. 2. Central Nervous System Infection-This can be in the form of Meningitis, which can be deadly. 3. Epidural Infections-This can be in the form of an epidural abscess, which can cause pressure inside of the spine, causing compression of the spinal cord with subsequent paralysis. This would require an emergency surgery to decompress, and there are no guarantees that the patient would recover from the paralysis. 4. Discitis-This is an infection of the intervertebral discs.  It occurs in about 1% of discography procedures.  It is difficult to treat and it may lead to surgery.        2. Pain: the needles have to go through skin and soft tissues, will cause soreness.       3. Damage to internal structures:  The nerves to be lesioned may be near blood vessels or    other nerves which can be potentially damaged.       4. Bleeding: Bleeding is more common if the patient is taking blood thinners such as  aspirin, Coumadin, Ticiid, Plavix, etc., or if he/she have some genetic predisposition  such as hemophilia.  Bleeding into the spinal canal can cause compression of the spinal  cord with subsequent paralysis.  This would require an emergency surgery to  decompress and there are no guarantees that the patient would recover from the  paralysis.       5. Pneumothorax:  Puncturing of a lung is a possibility, every  time a needle is introduced in  the area of the chest or upper back.  Pneumothorax refers to free air around the  collapsed lung(s), inside of the thoracic cavity (chest cavity).  Another two possible  complications related to a similar event would include: Hemothorax and Chylothorax.   These are variations of the Pneumothorax, where instead of air around the collapsed  lung(s), you may have blood or chyle, respectively.       6. Spinal headaches: They may occur with any procedures in the area of the spine.       7. Persistent CSF (Cerebro-Spinal Fluid) leakage: This is a rare problem, but may occur  with prolonged intrathecal or epidural catheters either due to the formation of a fistulous  track or a dural tear.       8. Nerve damage: By working so close to the spinal cord, there is always a possibility of  nerve damage, which could be as serious as a permanent spinal cord injury with  paralysis.       9. Death:  Although rare, severe deadly allergic reactions known as "Anaphylactic  reaction" can occur to any of the medications used.      10. Worsening of the symptoms:  We can always make thing worse.  What are the chances of something like this happening? Chances of any of this occuring are extremely low.  By statistics, you have more of a chance of getting killed in a motor vehicle accident: while driving to the hospital than any of the above occurring .  Nevertheless, you should be aware that they are possibilities.  In general, it is similar to taking a shower.  Everybody knows that you can slip, hit your head and get killed.  Does that mean that you should not shower again?  Nevertheless always keep  in mind that statistics do not mean anything if you happen to be on the wrong side of them.  Even if a procedure has a 1 (one) in a 1,000,000 (million) chance of going wrong, it you happen to be that one..Also, keep in mind that by statistics, you have more of a chance of having something go wrong when taking medications.  Who should not have this procedure? If you are on a blood thinning medication (e.g. Coumadin, Plavix, see list of "Blood Thinners"), or if you have an active infection going on, you should not have the procedure.  If you are taking any blood thinners, please inform your physician.  How should I prepare for this procedure?  Do not eat or drink anything at least six hours prior to the procedure.  Bring a driver with you .  It cannot be a taxi.  Come accompanied by an adult that can drive you back, and that is strong enough to help you if your legs get weak or numb from the local anesthetic.  Take all of your medicines the morning of the procedure with just enough water to swallow them.  If you have diabetes, make sure that you are scheduled to have your procedure done first thing in the morning, whenever possible.  If you have diabetes, take only half of your insulin dose and notify our nurse that you have done so as soon as you arrive at the clinic.  If you are diabetic, but only take blood sugar pills (oral hypoglycemic), then do not take them on the morning of your procedure.  You may take them after you have had the procedure.  Do not take aspirin or any aspirin-containing medications, at least eleven (11) days prior to the procedure.  They may prolong bleeding.  Wear loose fitting clothing that may be easy to take off and that you would not mind if it got stained with Betadine or blood.  Do not wear any jewelry or perfume  Remove any nail coloring.  It will interfere with some of our monitoring equipment.  NOTE: Remember that this is not meant to be interpreted as a  complete list of all possible complications.  Unforeseen problems may occur.  BLOOD THINNERS The following drugs contain aspirin or other products, which can cause increased bleeding during surgery and should not be taken for 2 weeks prior to and 1 week after surgery.  If you should need take something for relief of minor pain, you may take acetaminophen which is found in Tylenol,m Datril, Anacin-3 and Panadol. It is not blood thinner. The products listed below are.  Do not take any of the products listed below in addition to any listed on your instruction sheet.  A.P.C or A.P.C with Codeine Codeine Phosphate Capsules #3 Ibuprofen Ridaura  ABC compound Congesprin Imuran rimadil  Advil Cope Indocin Robaxisal  Alka-Seltzer Effervescent Pain Reliever and Antacid Coricidin or Coricidin-D  Indomethacin Rufen  Alka-Seltzer plus Cold Medicine Cosprin Ketoprofen S-A-C Tablets  Anacin Analgesic Tablets or Capsules Coumadin Korlgesic Salflex  Anacin Extra Strength Analgesic tablets or capsules CP-2 Tablets Lanoril Salicylate  Anaprox Cuprimine Capsules Levenox Salocol  Anexsia-D Dalteparin Magan Salsalate  Anodynos Darvon compound Magnesium Salicylate Sine-off  Ansaid Dasin Capsules Magsal Sodium Salicylate  Anturane Depen Capsules Marnal Soma  APF Arthritis pain formula Dewitt's Pills Measurin Stanback  Argesic Dia-Gesic Meclofenamic Sulfinpyrazone  Arthritis Bayer Timed Release Aspirin Diclofenac Meclomen Sulindac  Arthritis pain formula Anacin Dicumarol Medipren Supac  Analgesic (Safety coated) Arthralgen Diffunasal Mefanamic Suprofen  Arthritis Strength Bufferin Dihydrocodeine Mepro Compound Suprol  Arthropan liquid Dopirydamole Methcarbomol with Aspirin Synalgos  ASA tablets/Enseals Disalcid Micrainin Tagament  Ascriptin Doan's Midol Talwin  Ascriptin A/D Dolene Mobidin Tanderil  Ascriptin Extra Strength Dolobid Moblgesic Ticlid  Ascriptin with Codeine Doloprin or Doloprin with Codeine  Momentum Tolectin  Asperbuf Duoprin Mono-gesic Trendar  Aspergum Duradyne Motrin or Motrin IB Triminicin  Aspirin plain, buffered or enteric coated Durasal Myochrisine Trigesic  Aspirin Suppositories Easprin Nalfon Trillsate  Aspirin with Codeine Ecotrin Regular or Extra Strength Naprosyn Uracel  Atromid-S Efficin Naproxen Ursinus  Auranofin Capsules Elmiron Neocylate Vanquish  Axotal Emagrin Norgesic Verin  Azathioprine Empirin or Empirin with Codeine Normiflo Vitamin E  Azolid Emprazil Nuprin Voltaren  Bayer Aspirin plain, buffered or children's or timed BC Tablets or powders Encaprin Orgaran Warfarin Sodium  Buff-a-Comp Enoxaparin Orudis Zorpin  Buff-a-Comp with Codeine Equegesic Os-Cal-Gesic   Buffaprin Excedrin plain, buffered or Extra Strength Oxalid   Bufferin Arthritis Strength Feldene Oxphenbutazone   Bufferin plain or Extra Strength Feldene Capsules Oxycodone with Aspirin   Bufferin with Codeine Fenoprofen Fenoprofen Pabalate or Pabalate-SF   Buffets II Flogesic Panagesic   Buffinol plain or Extra Strength Florinal or Florinal with Codeine Panwarfarin   Buf-Tabs Flurbiprofen Penicillamine   Butalbital Compound Four-way cold tablets Penicillin   Butazolidin Fragmin Pepto-Bismol   Carbenicillin Geminisyn Percodan   Carna Arthritis Reliever Geopen Persantine   Carprofen Gold's salt Persistin   Chloramphenicol Goody's Phenylbutazone   Chloromycetin Haltrain Piroxlcam   Clmetidine heparin Plaquenil   Cllnoril Hyco-pap Ponstel   Clofibrate Hydroxy chloroquine Propoxyphen         Before stopping  any of these medications, be sure to consult the physician who ordered them.  Some, such as Coumadin (Warfarin) are ordered to prevent or treat serious conditions such as "deep thrombosis", "pumonary embolisms", and other heart problems.  The amount of time that you may need off of the medication may also vary with the medication and the reason for which you were taking it.  If you are  taking any of these medications, please make sure you notify your pain physician before you undergo any procedures.

## 2016-08-08 ENCOUNTER — Telehealth: Payer: Self-pay

## 2016-08-12 NOTE — Progress Notes (Signed)
Results were reviewed and found to be: abnormal  Review would suggest interventional pain management techniques may be of benefit

## 2016-09-02 ENCOUNTER — Ambulatory Visit: Payer: Medicare Other | Admitting: Pain Medicine

## 2016-09-02 NOTE — Progress Notes (Deleted)
Patient's Name: Richard Duncan  MRN: [id number]  Referring Provider: Sofie Hartigan, MD  DOB: 03-09-1950  PCP: Sofie Hartigan, MD  DOS: 09/02/2016  Note by: Gaspar Cola, MD  Service setting: Ambulatory outpatient  Specialty: Interventional Pain Management  Location: [facility] (AMB) Pain Management Facility    Patient type: Established   Primary Reason(s) for Visit: Encounter for post-procedure evaluation of chronic illness with mild to moderate exacerbation CC: No chief complaint on file.  HPI  Richard Duncan is a 66 y.o. year old, male patient, who comes today for a post-procedure evaluation. 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[place]); Chronic pain syndrome; Chronic prescription benzodiazepine use; Lower extremity neuropathy (Bilateral); Chronic hip pain (Location of Primary Source of Pain) (Bilateral) (R>L); History of bilateral total hip arthroplasty; Chronic low back pain (Location of Secondary source of pain) (Bilateral) (R>L); Chronic neck pain (Location of Tertiary source of pain) (Midline) (Bilateral); Disturbance of skin sensation; Lumbar facet arthropathy (Bilateral: L12, L2-3, L3-4, L4-5, L5-S1); Lumbar foraminal stenosis (Bilateral: L2-3, L3-4, and L4-5); Thoracic foraminal stenosis (Right: T8-9 and T9-10); Lumbar spondylosis; Osteoarthritis; Thoracic spondylosis; [employer] spinal cord stimulator; Elevated C-reactive protein (CRP); Neuropathy; Chronic shoulder pain (Bilateral) (L>R); Cervicogenic headache (Bilateral) (L>R); Lumbar lateral recess stenosis (Bilateral: L2-3, L3-4, and L4-5); Lumbar facet syndrome (Bilateral) (R>L); 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 His primarily concern today is the No chief complaint on file.  Pain Assessment: Location:     Radiating:   Onset:   Duration:   Quality:   Severity:  /10 (self-reported pain score)  Note: Reported level is compatible with observation.                   Effect on ADL:   Timing:   Modifying factors:    Richard Duncan comes in today for post-procedure evaluation after the treatment done on 08/07/2016.  Further details on both, my assessment(s), as well as the proposed treatment plan, please see below.  Post-Procedure Assessment  08/07/2016 Procedure: Diagnostic bilateral cervical facet block under fluoroscopic guidance and IV sedation Pre-procedure pain score:  8/10 Post-procedure pain score: 0/10 (100% relief) Influential Factors: BMI:   Intra-procedural challenges: None observed.         Assessment challenges: None detected.              Reported side-effects: None.        Post-procedural adverse reactions or complications: None reported         Sedation: Sedation provided. When no sedatives are used, the analgesic levels obtained are directly associated to the effectiveness of the local anesthetics. However, when sedation is provided, the level of analgesia obtained during the initial 1 hour following the intervention, is believed to be the result of a combination of factors. These factors may include, but are not limited to: 1. The effectiveness of the local anesthetics used. 2. The effects of the  analgesic(s) and/or anxiolytic(s) used.  3. The degree of discomfort experienced by the patient at the time of the procedure. 4. The patients ability and reliability in recalling and recording the events. 5. The presence and influence of possible secondary gains and/or psychosocial factors. Reported result: Relief experienced during the 1st hour after the procedure:   (Ultra-Short Term Relief)            Interpretative annotation: Clinically appropriate result. Analgesia during this period is likely to be Local Anesthetic and/or IV Sedative (Analgesic/Anxiolytic) related.          Effects of local anesthetic: The analgesic effects attained during this period are directly associated to the localized infiltration of local anesthetics and therefore cary significant diagnostic value as to the etiological location, or anatomical origin, of the pain. Expected duration of relief is directly dependent on the pharmacodynamics of the local anesthetic used. Long-acting (4-6 hours) anesthetics used.  Reported result: Relief during the next 4 to 6 hour after the procedure:   (Short-Term Relief)            Interpretative annotation: Clinically appropriate result. Analgesia during this period is likely to be Local Anesthetic-related.          Long-term benefit: Defined as the period of time past the expected duration of local anesthetics (1 hour for short-acting and 4-6 hours for long-acting). With the possible exception of prolonged sympathetic blockade from the local anesthetics, benefits during this period are typically attributed to, or associated with, other factors such as analgesic sensory neuropraxia, antiinflammatory effects, or beneficial biochemical changes provided by agents other than the local anesthetics.  Reported result: Extended relief following procedure:   (Long-Term Relief)            Interpretative annotation: Clinically appropriate result. Good relief. No permanent benefit expected. Inflammation  plays a part in the etiology to the pain.          Current benefits: Defined as persistent relief that continues at this point in time.   Reported results: Treated area: *** %       Interpretative annotation: Recurrence of symptoms. No permanent benefit expected. Effective diagnostic intervention.          Interpretation: Results would suggest a successful diagnostic intervention.                  Plan:  Please see "Plan of Care" for details.  Laboratory Chemistry  Inflammation Markers (CRP: Acute Phase) (ESR: Chronic Phase) Lab Results  Component Value Date   CRP 1.1 (H) 03/11/2016   ESRSEDRATE 6 03/11/2016                 Renal Function Markers Lab Results  Component Value Date   BUN 16 02/27/2016   CREATININE 1.08 02/27/2016   GFRAA >60 02/27/2016   GFRNONAA >60 02/27/2016                 Hepatic Function Markers Lab Results  Component Value Date   AST 20 02/27/2016   ALT 20 02/27/2016   ALBUMIN 4.0 02/27/2016   ALKPHOS 89 02/27/2016                 Electrolytes Lab Results  Component Value Date   NA 141 02/27/2016   K 3.9 02/27/2016   CL 108 02/27/2016   CALCIUM 9.0 02/27/2016   MG 1.7 03/11/2016                 Neuropathy Markers Lab Results  Component Value Date  VOJJKKXF81 226 03/11/2016                 Bone Pathology Markers Lab Results  Component Value Date   ALKPHOS 89 02/27/2016   25OHVITD1 33 03/11/2016   25OHVITD2 <1.0 03/11/2016   25OHVITD3 33 03/11/2016   CALCIUM 9.0 02/27/2016                 Coagulation Parameters Lab Results  Component Value Date   INR 1.0 07/14/2013   LABPROT 13.0 07/14/2013   APTT 31.8 07/14/2013   PLT 202 02/27/2016                 Cardiovascular Markers Lab Results  Component Value Date   HGB 16.1 02/27/2016   HCT 47.6 02/27/2016                 Note: Lab results reviewed.  Recent Diagnostic Imaging Review  Dg C-arm 1-60 Min-no Report  Result Date: 08/07/2016 Fluoroscopy was utilized by the  requesting physician.  No radiographic interpretation.   Note: Imaging results reviewed.          Meds   No outpatient prescriptions have been marked as taking for the 09/02/16 encounter (Appointment) with Milinda Pointer, MD.    ROS  Constitutional: Denies any fever or chills Gastrointestinal: No reported hemesis, hematochezia, vomiting, or acute GI distress Musculoskeletal: Denies any acute onset joint swelling, redness, loss of ROM, or weakness Neurological: No reported episodes of acute onset apraxia, aphasia, dysarthria, agnosia, amnesia, paralysis, loss of coordination, or loss of consciousness  Allergies  Richard Duncan is allergic to propofol and propranolol.  [place]  Drug: Richard Duncan  reports that he does not use drugs. Alcohol:  reports that he drinks alcohol. Tobacco:  reports that he quit smoking about 50 years ago. His smoking use included Cigarettes. He quit after 2.00 years of use. He has never used smokeless tobacco. Medical:  has a past medical history of Anxiety; Arthritis; Arthropathia (11/08/2013); Bilateral carpal tunnel syndrome; Cancer ([place]) (2011); Complication of anesthesia; Depression; Difficult intubation; ED (erectile dysfunction); History of blood product transfusion; History of hiatal hernia; History of kidney stones; Hyperlipemia; Hypertension; Neuropathy; Obesity; Over weight; Presence of permanent cardiac pacemaker; Prostatitis; PTSD (post-traumatic stress disorder); Sleep apnea; and Urinary retention. Surgical: Richard Duncan  has a past surgical history that includes Cardiac pacemaker placement; Appendectomy; Cervical fusion; Tonsillectomy; Colonoscopy; Spinal cord stimulator insertion (N/A, 07/08/2014); Back surgery (1988, 2006); Back surgery; Cervical spine surgery (N/A, 2008); Joint replacement (2004, 2005, 2014, 2015); and Excision of abdominal wall tumor (N/A, 03/08/2016). Family: family history includes Benign prostatic hyperplasia in his father; Hypertension  in his mother; Prostate cancer in his father.  Constitutional Exam  General appearance: Well nourished, well developed, and well hydrated. In no apparent acute distress There were no vitals filed for this visit. BMI Assessment: Estimated body mass index is 50.05 kg/m as calculated from the following:   Height as of 08/07/16: 6' (1.829 m).   Weight as of 08/07/16: 369 lb (167.4 kg).  BMI interpretation table: BMI level Category Range association with higher incidence of chronic pain  <18 kg/m2 Underweight   18.5-24.9 kg/m2 Ideal body weight   25-29.9 kg/m2 Overweight Increased incidence by 20%  30-34.9 kg/m2 Obese (Class I) Increased incidence by 68%  35-39.9 kg/m2 Severe obesity (Class II) Increased incidence by 136%  >40 kg/m2 Extreme obesity (Class III) Increased incidence by 254%   BMI Readings from Last 4 Encounters:  08/07/16 50.05 kg/m  06/18/16  49.64 kg/m  05/24/16 50.45 kg/m  03/11/16 48.28 kg/m   Wt Readings from Last 4 Encounters:  08/07/16 (!) 369 lb (167.4 kg)  06/18/16 (!) 366 lb (166 kg)  05/24/16 (!) 372 lb (168.7 kg)  03/11/16 (!) 356 lb (161.5 kg)  Psych/Mental status: Alert, oriented x 3 (person, place, & time)       Eyes: PERLA Respiratory: No evidence of acute respiratory distress  Cervical Spine Area Exam  Skin & Axial Inspection: No masses, redness, edema, swelling, or associated skin lesions Alignment: Symmetrical Functional ROM: Unrestricted ROM      Stability: No instability detected Muscle Tone/Strength: Functionally intact. No obvious neuro-muscular anomalies detected. Sensory (Neurological): Unimpaired Palpation: No palpable anomalies              Upper Extremity (UE) Exam    Side: Right upper extremity  Side: Left upper extremity  Skin & Extremity Inspection: Skin color, temperature, and hair growth are WNL. No peripheral edema or cyanosis. No masses, redness, swelling, asymmetry, or associated skin lesions. No contractures.  Skin &  Extremity Inspection: Skin color, temperature, and hair growth are WNL. No peripheral edema or cyanosis. No masses, redness, swelling, asymmetry, or associated skin lesions. No contractures.  Functional ROM: Unrestricted ROM          Functional ROM: Unrestricted ROM          Muscle Tone/Strength: Functionally intact. No obvious neuro-muscular anomalies detected.  Muscle Tone/Strength: Functionally intact. No obvious neuro-muscular anomalies detected.  Sensory (Neurological): Unimpaired  Sensory (Neurological): Unimpaired  Palpation: No palpable anomalies              Palpation: No palpable anomalies              Specialized Test(s): Deferred         Specialized Test(s): Deferred          Thoracic Spine Area Exam  Skin & Axial Inspection: No masses, redness, or swelling Alignment: Symmetrical Functional ROM: Unrestricted ROM Stability: No instability detected Muscle Tone/Strength: Functionally intact. No obvious neuro-muscular anomalies detected. Sensory (Neurological): Unimpaired Muscle strength & Tone: No palpable anomalies  Lumbar Spine Area Exam  Skin & Axial Inspection: No masses, redness, or swelling Alignment: Symmetrical Functional ROM: Unrestricted ROM      Stability: No instability detected Muscle Tone/Strength: Functionally intact. No obvious neuro-muscular anomalies detected. Sensory (Neurological): Unimpaired Palpation: No palpable anomalies       Provocative Tests: Lumbar Hyperextension and rotation test: evaluation deferred today       Lumbar Lateral bending test: evaluation deferred today       Patrick's Maneuver: evaluation deferred today                    Gait & Posture Assessment  Ambulation: Unassisted Gait: Relatively normal for age and body habitus Posture: WNL   Lower Extremity Exam    Side: Right lower extremity  Side: Left lower extremity  Skin & Extremity Inspection: Skin color, temperature, and hair growth are WNL. No peripheral edema or cyanosis. No  masses, redness, swelling, asymmetry, or associated skin lesions. No contractures.  Skin & Extremity Inspection: Skin color, temperature, and hair growth are WNL. No peripheral edema or cyanosis. No masses, redness, swelling, asymmetry, or associated skin lesions. No contractures.  Functional ROM: Unrestricted ROM          Functional ROM: Unrestricted ROM          Muscle Tone/Strength: Functionally intact. No obvious neuro-muscular anomalies detected.  Muscle Tone/Strength: Functionally intact. No obvious neuro-muscular anomalies detected.  Sensory (Neurological): Unimpaired  Sensory (Neurological): Unimpaired  Palpation: No palpable anomalies  Palpation: No palpable anomalies   Assessment  Primary Diagnosis & Pertinent Problem List: The primary encounter diagnosis was Chronic hip pain (Location of Primary Source of Pain) (Bilateral) (R>L). Diagnoses of Chronic low back pain (Location of Secondary source of pain) (Bilateral) (R>L), Chronic neck pain (Location of Tertiary source of pain) (Midline) (Bilateral), Cervical facet syndrome (Bilateral), Cervical spondylosis, and Cervicogenic headache (B)(L>R) were also pertinent to this visit.  Status Diagnosis  Controlled Controlled Controlled 1. Chronic hip pain (Location of Primary Source of Pain) (Bilateral) (R>L)   2. Chronic low back pain (Location of Secondary source of pain) (Bilateral) (R>L)   3. Chronic neck pain (Location of Tertiary source of pain) (Midline) (Bilateral)   4. Cervical facet syndrome (Bilateral)   5. Cervical spondylosis   6. Cervicogenic headache (B)(L>R)     Problems updated and reviewed during this visit: Problem  Cervicogenic headache (Bilateral) (L>R)   Plan of Care  Pharmacotherapy (Medications Ordered): No orders of the defined types were placed in this encounter.  New Prescriptions   No medications on file   Medications administered today: Richard Duncan had no medications administered during this  visit.  Orders:          Interventional management options: Planned, scheduled, and/or pending:   Not at this time.   Considering:   Diagnostic Thoracic epidural steroid injection  Diagnostic Lumbar epidural steroid injection  Diagnostic Transforaminal thoracic epidural steroid injection  Diagnostic Transforaminal lumbar epidural steroid injection  Diagnostic Thoracic facet blocks  Possible thoracic facet RFA Diagnostic Bilateral Lumbar facet blocks  Possible Bilateral Lumbar facet RFA Diagnostic Lumbar sympathetic block  Possible lumbar sympathetic RFA    Palliative PRN treatment(s):   Diagnostic bilateral cervical facet block under fluoroscopic guidance and IV sedation    Provider-requested follow-up: No Follow-up on file.  Future Appointments Date Time Provider [place]  09/02/2016 8:00 AM Milinda Pointer, MD [facility] None  11/22/2016 9:00 AM Ernestine Conrad, Hunt [place]is, PA-C [facility] None   Primary Care Physician: Sofie Hartigan, MD Location: [facility] Note by: Gaspar Cola, MD Date: 09/02/2016; Time: 7:34 AM  There are no Patient Instructions on file for this visit.

## 2016-09-17 ENCOUNTER — Other Ambulatory Visit: Payer: Self-pay | Admitting: Cardiology

## 2016-09-17 DIAGNOSIS — I519 Heart disease, unspecified: Secondary | ICD-10-CM

## 2016-09-18 ENCOUNTER — Other Ambulatory Visit: Payer: Self-pay | Admitting: Cardiology

## 2016-09-18 DIAGNOSIS — R0789 Other chest pain: Secondary | ICD-10-CM

## 2016-09-18 DIAGNOSIS — R0602 Shortness of breath: Secondary | ICD-10-CM

## 2016-09-20 DIAGNOSIS — R202 Paresthesia of skin: Secondary | ICD-10-CM

## 2016-09-20 DIAGNOSIS — R200 Anesthesia of skin: Secondary | ICD-10-CM | POA: Insufficient documentation

## 2016-09-22 ENCOUNTER — Emergency Department: Payer: Medicare Other

## 2016-09-22 ENCOUNTER — Emergency Department
Admission: EM | Admit: 2016-09-22 | Discharge: 2016-09-22 | Disposition: A | Discharge status: Home / Self Care | Payer: Medicare Other | Attending: Emergency Medicine | Admitting: Emergency Medicine

## 2016-09-22 DIAGNOSIS — Z950 Presence of cardiac pacemaker: Secondary | ICD-10-CM | POA: Insufficient documentation

## 2016-09-22 DIAGNOSIS — N130 Hydronephrosis with ureteropelvic junction obstruction: Secondary | ICD-10-CM | POA: Diagnosis not present

## 2016-09-22 DIAGNOSIS — Z79899 Other long term (current) drug therapy: Secondary | ICD-10-CM | POA: Insufficient documentation

## 2016-09-22 DIAGNOSIS — Z96642 Presence of left artificial hip joint: Secondary | ICD-10-CM | POA: Insufficient documentation

## 2016-09-22 DIAGNOSIS — N23 Unspecified renal colic: Secondary | ICD-10-CM

## 2016-09-22 DIAGNOSIS — Z87891 Personal history of nicotine dependence: Secondary | ICD-10-CM | POA: Insufficient documentation

## 2016-09-22 DIAGNOSIS — I10 Essential (primary) hypertension: Secondary | ICD-10-CM | POA: Insufficient documentation

## 2016-09-22 DIAGNOSIS — Z96641 Presence of right artificial hip joint: Secondary | ICD-10-CM | POA: Insufficient documentation

## 2016-09-22 DIAGNOSIS — Z7982 Long term (current) use of aspirin: Secondary | ICD-10-CM | POA: Insufficient documentation

## 2016-09-22 DIAGNOSIS — R109 Unspecified abdominal pain: Secondary | ICD-10-CM | POA: Diagnosis present

## 2016-09-22 LAB — URINALYSIS, COMPLETE (UACMP) WITH MICROSCOPIC
Bacteria, UA: NONE SEEN
Bilirubin Urine: NEGATIVE
Glucose, UA: NEGATIVE mg/dL
Ketones, ur: NEGATIVE mg/dL
Leukocytes, UA: NEGATIVE
Nitrite: NEGATIVE
Protein, ur: NEGATIVE mg/dL
Specific Gravity, Urine: 1.013 (ref 1.005–1.030)
pH: 6 (ref 5.0–8.0)

## 2016-09-22 LAB — COMPREHENSIVE METABOLIC PANEL
ALT: 25 U/L (ref 17–63)
AST: 27 U/L (ref 15–41)
Albumin: 4.1 g/dL (ref 3.5–5.0)
Alkaline Phosphatase: 91 U/L (ref 38–126)
Anion gap: 11 (ref 5–15)
BUN: 20 mg/dL (ref 6–20)
CO2: 24 mmol/L (ref 22–32)
Calcium: 9 mg/dL (ref 8.9–10.3)
Chloride: 107 mmol/L (ref 101–111)
Creatinine, Ser: 1.2 mg/dL (ref 0.61–1.24)
GFR calc Af Amer: >60 mL/min (ref >60)
GFR calc non Af Amer: >60 mL/min (ref >60)
Glucose, Bld: 133 mg/dL — ABNORMAL HIGH (ref 65–99)
Potassium: 3.7 mmol/L (ref 3.5–5.1)
Sodium: 142 mmol/L (ref 135–145)
Total Bilirubin: 1.6 mg/dL — ABNORMAL HIGH (ref 0.3–1.2)
Total Protein: 7.4 g/dL (ref 6.5–8.1)

## 2016-09-22 LAB — CBC WITH DIFFERENTIAL/PLATELET
Basophils Absolute: 0.1 10*3/uL (ref 0–0.1)
Basophils Relative: 1 %
Eosinophils Absolute: 0.2 10*3/uL (ref 0–0.7)
Eosinophils Relative: 2 %
HCT: 50 % (ref 40.0–52.0)
Hemoglobin: 17.1 g/dL (ref 13.0–18.0)
Lymphocytes Relative: 10 %
Lymphs Abs: 1.4 10*3/uL (ref 1.0–3.6)
MCH: 31.8 pg (ref 26.0–34.0)
MCHC: 34.3 g/dL (ref 32.0–36.0)
MCV: 92.9 fL (ref 80.0–100.0)
Monocytes Absolute: 0.9 10*3/uL (ref 0.2–1.0)
Monocytes Relative: 7 %
Neutro Abs: 10.9 10*3/uL — ABNORMAL HIGH (ref 1.4–6.5)
Neutrophils Relative %: 80 %
Platelets: 188 10*3/uL (ref 150–440)
RBC: 5.38 MIL/uL (ref 4.40–5.90)
RDW: 13.9 % (ref 11.5–14.5)
WBC: 13.4 10*3/uL — ABNORMAL HIGH (ref 3.8–10.6)

## 2016-09-22 MED ORDER — ONDANSETRON HCL 4 MG/2ML IJ SOLN
4.0000 mg | Freq: Once | INTRAMUSCULAR | Status: AC
  Administered 2016-09-22: 4 mg via INTRAVENOUS
  Filled 2016-09-22: qty 2

## 2016-09-22 MED ORDER — OXYCODONE-ACETAMINOPHEN 5-325 MG PO TABS: 1.0000 | ORAL_TABLET | Freq: Four times a day (QID) | ORAL | 0 refills | Status: DC | PRN

## 2016-09-22 MED ORDER — HYDROMORPHONE HCL 1 MG/ML IJ SOLN
0.5000 mg | Freq: Once | INTRAMUSCULAR | Status: AC
  Administered 2016-09-22: 0.5 mg via INTRAVENOUS
  Filled 2016-09-22: qty 1

## 2016-09-22 MED ORDER — SODIUM CHLORIDE 0.9 % IV SOLN
Freq: Once | INTRAVENOUS | Status: AC
Start: 2016-09-22 — End: 2016-09-22
  Administered 2016-09-22: 08:00:00 via INTRAVENOUS

## 2016-09-22 NOTE — ED Provider Notes (Signed)
[facility] Emergency Department Provider Note   ____________________________________________   First MD Initiated Contact with Patient 09/22/16 [phone]     (approximate)  I have reviewed the triage vital signs and the nursing notes.   HISTORY  Chief Complaint Flank Pain   HPI Richard Duncan is a 66 y.o. male who complains of right-sided flank pain. Pain started last night at dinner. He's been having some blood in his urine for 2 days. He has a history of 3 previous stones. This pain is like his previous renal stone pain starts in the right flank and radiates around to the groin. Pain is severe at present. After giving him Dilaudid IV the pain gets better.   Past Medical History:  Diagnosis Date  . Anxiety   . Arthritis   . 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 Bilateral carpal tunnel syndrome   . Cancer [facility]) 2011   Prostate Ca  . Complication of anesthesia    urinary retention with Popanol  . Depression   . Difficult intubation   . ED (erectile dysfunction)   . History of blood product transfusion   . History of hiatal hernia   . History of kidney stones   . Hyperlipemia   . Hypertension   . Neuropathy   . Obesity   . Over weight   . Presence of permanent cardiac pacemaker    Medtronic  . Prostatitis   . PTSD (post-traumatic stress disorder)   . Sleep apnea    OSA--use Bi-PAP  . Urinary retention     Patient Active Problem List   Diagnosis Date Noted  . Cervical spondylosis 08/07/2016  . Numbness of upper extremity (Bilateral) 07/30/2016  . Chronic cervical radiculopathy (Bilateral) 07/30/2016  . Carpal tunnel syndrome (Bilateral) 07/30/2016  . Cervical facet syndrome (Bilateral) 07/30/2016  . Chronic shoulder pain (Bilateral) (L>R) 06/18/2016  . Cervicogenic headache (Bilateral) (L>R) 06/18/2016  . Lumbar lateral recess stenosis (Bilateral: L2-3, L3-4, and L4-5) 06/18/2016  . Lumbar facet syndrome (Bilateral) (R>L) 06/18/2016  . 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 Neurogenic pain 06/18/2016  . Neuropathic pain 06/18/2016  . Thoracic facet arthropathy (Bilateral: T7-8, T8-9, and T9-10) 06/18/2016  . DDD (degenerative disc disease), lumbar 06/18/2016  . DDD (degenerative disc disease), cervical 06/18/2016  . DDD (degenerative disc disease), thoracic 06/18/2016  . Neuropathy 04/16/2016  . Elevated C-reactive protein (CRP) 04/10/2016  . Chronic pain syndrome 03/11/2016  . Chronic prescription benzodiazepine use 03/11/2016  . Lower extremity neuropathy (Bilateral) 03/11/2016  . Chronic hip pain (Location of Primary Source of Pain) (Bilateral) (R>L) 03/11/2016  . 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 Chronic low back pain (Location of Secondary source of pain) (Bilateral) (R>L) 03/11/2016  . Chronic neck pain (Location of Tertiary source of pain) (Midline) (Bilateral) 03/11/2016  . Disturbance of skin sensation 03/11/2016  . Lumbar facet arthropathy (Bilateral: L12, L2-3, L3-4, L4-5, L5-S1) 03/11/2016  . Lumbar foraminal stenosis (Bilateral: L2-3, L3-4, and L4-5) 03/11/2016  . Thoracic foraminal stenosis (Right: T8-9 and T9-10) 03/11/2016  . Lumbar spondylosis 03/11/2016  . Osteoarthritis 03/11/2016  . Thoracic spondylosis 03/11/2016  . Medtronic spinal cord stimulator 03/11/2016  . Acquired heart block 12/07/2015  . 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 Lower urinary tract symptoms 09/13/2014  . Anxiety 06/18/2014  . Cardiac disease 06/18/2014  . HLD (hyperlipidemia) 06/18/2014  . BP (high blood pressure) 06/18/2014  . CA of prostate ([place]) 06/18/2014  .  ED (erectile dysfunction) 06/18/2014  . Prostatitis 06/18/2014  . Obesity 06/18/2014  . Urinary retention 06/18/2014  . Mechanical complication of prosthetic joint implant ([place]) 07/12/2013  . 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 APPENDECTOMY    . [place], 2006   Lumbar Spine  . BACK SURGERY     x2 Laminectomy  . CARDIAC PACEMAKER PLACEMENT    . CERVICAL FUSION    . CERVICAL SPINE SURGERY N/A 2008  . COLONOSCOPY    . EXCISION OF ABDOMINAL WALL TUMOR N/A 03/08/2016   Procedure: EXCISION OF ABDOMINAL WALL MASS;  Surgeon: Leonie Green, MD;  Location: [facility] ORS;  Service: General;  Laterality: N/A;  . JOINT REPLACEMENT  2004, 2005, 2014, 2015   Bilat Hip Replacements  . SPINAL CORD STIMULATOR INSERTION N/A 07/08/2014   Procedure: LUMBAR SPINAL CORD STIMULATOR INSERTION;  Surgeon: Clydell Hakim, MD;  Location: [place];  Service: Neurosurgery;  Laterality: N/A;  LUMBAR SPINAL CORD STIMULATOR INSERTION  . TONSILLECTOMY      Prior to Admission medications   Medication Sig Start Date End Date Taking? Authorizing Provider  amLODipine (NORVASC) 10 MG tablet Take 10 mg by mouth daily.  06/08/14   [provider]  aspirin EC 81 MG tablet Take 81 mg by mouth daily.     [provider]  benazepril (LOTENSIN) 20 MG tablet Take 20 mg by mouth daily.  06/08/14   [provider]  Cholecalciferol (VITAMIN D3) 2000 UNITS capsule Take 2,000 Units by mouth daily.     [provider]  doxycycline (VIBRAMYCIN) 100 MG capsule 100 mg as needed.  02/02/16   [provider]  metoprolol tartrate (LOPRESSOR) 25 MG tablet Take 25 mg by mouth daily.  08/05/16   [provider]  oxyCODONE-acetaminophen (ROXICET) 5-325 MG tablet Take 1 tablet by mouth every 6 (six) hours as needed. 09/22/16 09/22/17  Nena Polio, MD    Allergies Propofol and Propranolol  Family History  Problem Relation Age of Onset  . Benign prostatic hyperplasia Father   . Prostate cancer Father   . Hypertension Mother   . Kidney disease Neg Hx   . Kidney cancer Neg Hx   . Bladder Cancer Neg Hx     Social History Social History  Substance Use Topics  .  Smoking status: Former Smoker    Years: 2.00    Types: Cigarettes    Quit date: 06/18/1966  . Smokeless tobacco: Never Used     Comment: quit 40 years ago  . Alcohol use 0.0 oz/week     Comment: Occasional    Review of Systems  Constitutional: No fever/chills Eyes: No visual changes. ENT: No sore throat. Cardiovascular: Denies chest pain. Respiratory: Denies shortness of breath. Gastrointestinal: See history of present illness patient does have some nausea. Genitourinary: Negative for dysuria. Musculoskeletal: Negative for back pain. Skin: Negative for rash. Neurological: Negative for headaches, focal weaknes    ____________________________________________   PHYSICAL EXAM:  VITAL SIGNS: ED Triage Vitals  Enc Vitals Group     BP 09/22/16 0729 (!) 164/92     Pulse Rate 09/22/16 0729 96     Resp 09/22/16 0729 20     Temp 09/22/16 0729 97.8 F (36.6 C)     Temp Source 09/22/16 0729 Oral     SpO2 09/22/16 0729 95 %     Weight 09/22/16 0729 (!) 370  lb (167.8 kg)     Height 09/22/16 0729 6' (1.829 m)     Head Circumference --      Peak Flow --      Pain Score 09/22/16 0726 9     Pain Loc --      Pain Edu? --      Excl. in [place]? --     Constitutional: Alert and oriented. Well appearing and in no acute distress. Eyes: Conjunctivae are normal.  Head: Atraumatic. Nose: No congestion/rhinnorhea. Mouth/Throat: Mucous membranes are moist.  Oropharynx non-erythematous. Neck: No stridor. Cardiovascular: Normal rate, regular rhythm. Grossly normal heart sounds.  Good peripheral circulation. Respiratory: Normal respiratory effort.  No retractions. Lungs CTAB. Gastrointestinal: Soft Some right-sided tenderness to palpation in the lower abdomen No distention. No abdominal bruits. No CVA tenderness. Musculoskeletal: No lower extremity tenderness nor edema.  No joint effusions. Neurologic:  Normal speech and language. No gross focal neurologic deficits are appreciated. No gait  instability. Skin:  Skin is warm, dry and intact. No rash noted. Psychiatric: Mood and affect are normal. Speech and behavior are normal.  ____________________________________________   LABS (all labs ordered are listed, but only abnormal results are displayed)  Labs Reviewed  URINALYSIS, COMPLETE (UACMP) WITH MICROSCOPIC - Abnormal; Notable for the following:       Result Value   Color, Urine YELLOW (*)    APPearance CLEAR (*)    Hgb urine dipstick LARGE (*)    Squamous Epithelial / LPF 0-5 (*)    All other components within normal limits  COMPREHENSIVE METABOLIC PANEL - Abnormal; Notable for the following:    Glucose, Bld 133 (*)    Total Bilirubin 1.6 (*)    All other components within normal limits  CBC WITH DIFFERENTIAL/PLATELET - Abnormal; Notable for the following:    WBC 13.4 (*)    Neutro Abs 10.9 (*)    All other components within normal limits   ____________________________________________  EKG   ____________________________________________  RADIOLOGY  Ct Renal Stone Study  Result Date: 09/22/2016 CLINICAL DATA:  Right-sided flank pain and hematuria for 2 days. Nephrolithiasis. EXAM: CT ABDOMEN AND PELVIS WITHOUT CONTRAST TECHNIQUE: Multidetector CT imaging of the abdomen and pelvis was performed following the standard protocol without IV contrast. COMPARISON:  11/24/2014 and 09/24/2010 FINDINGS: Lower chest: No acute findings. Hepatobiliary: No masses visualized on this unenhanced exam. Stable tiny sub-cm low-attenuation lesions in left hepatic lobe, most consistent with tiny cysts. Pancreas: No mass or inflammatory process visualized on this unenhanced exam. Spleen:  Within normal limits in size. Adrenals/Urinary tract: Stable 1.8 cm low-attenuation left adrenal mass, consistent with benign adenoma. Multiple small less than 1 cm renal calculi are seen bilaterally. Mild right hydronephrosis is seen due to 6 mm calculus at the right ureteropelvic junction. A 10 mm  calculus is seen in the left renal pelvis. 11 mm calculus is seen in the distal left ureter, without left hydroureteronephrosis. No bladder calculi identified. Stomach/Bowel: No evidence of obstruction, inflammatory process, or abnormal fluid collections. Vascular/Lymphatic: No pathologically enlarged lymph nodes identified. No evidence of abdominal aortic aneurysm. Reproductive: Mild to moderate prostate enlargement, with median lobe hypertrophy indenting the bladder base. Other: Significant beam hardening artifact through the inferior pelvis from bilateral hip prostheses. Musculoskeletal:  No suspicious bone lesions identified. IMPRESSION: Mild right hydronephrosis due to 6 mm calculus at right ureteropelvic junction. 11 mm distal left ureteral calculus and 10 mm calculus in left renal pelvis. No left hydroureteronephrosis. Bilateral intrarenal calculi. Stable small benign  left adrenal adenoma. Enlarged prostate. Electronically Signed   By: Earle Gell M.D.   On: 09/22/2016 08:32    ____________________________________________   PROCEDURES  Procedure(s) performed:   Procedures  Critical Care performed:   ____________________________________________   INITIAL IMPRESSION / ASSESSMENT AND PLAN / ED COURSE  Pertinent labs & imaging results that were available during my care of the patient were reviewed by me and considered in my medical decision making (see chart for details).     patient doing better on discharge had discussed the patient with Dr. Junious Silk who wanted outpatient follow-up. Patient to return if he has a fever vomiting worse pain or can't pass urine. ____________________________________________   FINAL CLINICAL IMPRESSION(S) / ED DIAGNOSES  Final diagnoses:  Ureteral colic      NEW MEDICATIONS STARTED DURING THIS VISIT:  Discharge Medication List as of 09/22/2016 11:43 AM    START taking these medications   Details  oxyCODONE-acetaminophen (ROXICET) 5-325 MG  tablet Take 1 tablet by mouth every 6 (six) hours as needed., Starting Sun 09/22/2016, Until Mon 09/22/2017, Print         Note:  This document was prepared using Dragon voice recognition software and may include unintentional dictation errors.    Nena Polio, MD 09/22/16 [phone]

## 2016-09-22 NOTE — Discharge Instructions (Signed)
Call [facility] urological Monday if they do not call you by about 11:00. Remember to have a 6 mm kidney stone on the right side very high and an 11 mm kidney stone on the left side. I spoke with Dr. Junious Silk today and he wants to see you in the office for further workup. Please return here for fever vomiting or if she stopped making any urine or get worse pain.

## 2016-09-22 NOTE — ED Triage Notes (Signed)
Patient c/o right sided flank pain. States he started urinating blood two days ago, pain onset was last night at dinner time. Hx kidney stones

## 2016-09-23 ENCOUNTER — Telehealth: Payer: Self-pay | Admitting: Urology

## 2016-09-23 NOTE — Telephone Encounter (Signed)
done

## 2016-09-23 NOTE — Telephone Encounter (Signed)
-----   Message from Festus Aloe, MD sent at 09/22/2016  3:15 PM EDT ----- Pt needs f/u this week - on CT pt has a 6 mm right proximal stone (mild hydro) and a 11 mm left distal stone (no hydro, there since 02/2016 l-spine CT)  Thanks, Dr Johnette Abraham

## 2016-09-25 ENCOUNTER — Encounter: Payer: Self-pay | Admitting: Urology

## 2016-09-25 ENCOUNTER — Ambulatory Visit (INDEPENDENT_AMBULATORY_CARE_PROVIDER_SITE_OTHER): Payer: Medicare Other | Admitting: Urology

## 2016-09-25 ENCOUNTER — Other Ambulatory Visit: Payer: Self-pay | Admitting: Radiology

## 2016-09-25 ENCOUNTER — Telehealth: Payer: Self-pay | Admitting: Radiology

## 2016-09-25 VITALS — BP 146/75 | HR 79 | Ht 72.0 in | Wt 370.0 lb

## 2016-09-25 DIAGNOSIS — N201 Calculus of ureter: Secondary | ICD-10-CM | POA: Diagnosis not present

## 2016-09-25 DIAGNOSIS — C61 Malignant neoplasm of prostate: Secondary | ICD-10-CM | POA: Diagnosis not present

## 2016-09-25 DIAGNOSIS — N1330 Unspecified hydronephrosis: Secondary | ICD-10-CM

## 2016-09-25 DIAGNOSIS — IMO0001 Reserved for inherently not codable concepts without codable children: Secondary | ICD-10-CM

## 2016-09-25 NOTE — Telephone Encounter (Signed)
Notified pt of bilateral ureteral stent placement with Dr Pilar Jarvis on 09/27/16, pre-admit testing appt & to call day prior to surgery for arrival time to SDS. Questions answered. Pt voices understanding.

## 2016-09-25 NOTE — Telephone Encounter (Signed)
LMOM. Need to discuss upcoming surgery with Dr Pilar Jarvis.

## 2016-09-26 ENCOUNTER — Encounter
Admission: RE | Admit: 2016-09-26 | Discharge: 2016-09-26 | Disposition: A | Discharge status: Home / Self Care | Payer: Medicare Other | Source: Ambulatory Visit | Attending: Urology | Admitting: Urology

## 2016-09-26 HISTORY — DX: Dyspnea, unspecified: R06.00

## 2016-09-26 HISTORY — DX: Gastro-esophageal reflux disease without esophagitis: K21.9

## 2016-09-26 NOTE — Pre-Procedure Instructions (Signed)
Sydnee Levans, MD - 09/12/2016 3:15 PM EDT Formatting of this note may be different from the original.   Chief Complaint: Chief Complaint  Patient presents with  . Shortness of Breath  last few months more short of breath walking and exerction sweating  . Fatigue  Im more tired in the last few months  . Dizziness  I do have some at times  Date of Service: 09/12/2016 Date of Birth: 1950-06-04 PCP: Loa Socks, MD  History of Present Illness: Mr. Pandya is a 66 y.o.male patient who presents to establish care. He has a permanent pacemaker placed which is been functioning normally. It is a [employer]tronic dual-chamber device. He has done well with this. He denies any syncope or presyncope. Most recent pacer check shows approximately 5 years left on the battery life. Does complain of some shortness of breath as well as chest tightness. EKG today reveals ventricular paced rhythm at a rate of 79 with a QRS duration 174 ms and a QTC of 520 ms. Past Medical and Surgical History  Past Medical History Past Medical History:  Diagnosis Date  . Anxiety, unspecified  . Arthritis  . Carpal tunnel syndrome  . Depression, unspecified  . Heart disease  . Hyperlipidemia, unspecified  . Hypertension  . Kidney disease  . Prostate cancer (CMS-HCC)  . Sleep apnea   Past Surgical History He has a past surgical history that includes Replacement total hip w/ resurfacing implants (Left, 2004); Appendectomy; Tonsillectomy; Back surgery (1988, 2000); Insert / replace / remove pacemaker; Fusion of spine (2000s); Revision of femoral head component. Attempted revision of femoral stem and acetabular (07/22/13); Colonoscopy (12/18/2004); Colonoscopy (01/24/2014); and Excision of Lipoma (03/08/2016).   Medications and Allergies  Current Medications  Current Outpatient Prescriptions  Medication Sig Dispense Refill  . amLODIPine (NORVASC) 10 MG tablet Take 1 tablet (10 mg total) by mouth once daily.  90 tablet 1  . aspirin 81 MG EC tablet Take 81 mg by mouth once daily.  . benazepril (LOTENSIN) 20 MG tablet Take 1 tablet (20 mg total) by mouth once daily. 90 tablet 1  . cholecalciferol (VITAMIN D3) 2,000 unit capsule Take 2,000 Units by mouth once daily.  . citalopram (CELEXA) 40 MG tablet Take one tablet daily  . diazePAM (VALIUM) 5 MG tablet Take 5 mg by mouth every 12 (twelve) hours as needed for Anxiety.  . hydrALAZINE (APRESOLINE) 25 MG tablet Take 1 tablet (25 mg total) by mouth 2 (two) times daily. 180 tablet 1  . metoprolol tartrate (LOPRESSOR) 25 MG tablet Take 1 tablet (25 mg total) by mouth 2 (two) times daily. 60 tablet 6   No current facility-administered medications for this visit.   Allergies: Propofol and Propranolol  Social and Family History  Social History reports that he has never smoked. He has never used smokeless tobacco. He reports that he drinks alcohol. He reports that he does not use drugs.  Family History Family History  Problem Relation Age of Onset  . Osteoporosis (Thinning of bones) Mother  . High blood pressure (Hypertension) Mother  . Alzheimer's disease Mother  . Prostate cancer Father  . Diabetes Father  . HIV Brother  AIDS  . No Known Problems Daughter  . No Known Problems Daughter   Review of Systems  Review of Systems  Constitutional: Negative for chills, diaphoresis, fever, malaise/fatigue and weight loss.  HENT: Negative for congestion, ear discharge, hearing loss and tinnitus.  Eyes: Negative for blurred vision.  Respiratory: Negative  for cough, hemoptysis, sputum production, shortness of breath and wheezing.  Cardiovascular: Negative for chest pain, palpitations, orthopnea, claudication, leg swelling and PND.  Gastrointestinal: Negative for abdominal pain, blood in stool, constipation, diarrhea, heartburn, melena, nausea and vomiting.  Genitourinary: Negative for dysuria, frequency, hematuria and urgency.  Musculoskeletal: Negative  for back pain, falls, joint pain and myalgias.  Skin: Negative for itching and rash.  Neurological: Negative for dizziness, tingling, focal weakness, loss of consciousness, weakness and headaches.  Endo/Heme/Allergies: Negative for polydipsia. Does not bruise/bleed easily.  Psychiatric/Behavioral: Negative for depression, memory loss and substance abuse. The patient is not nervous/anxious.   Physical Examination   Vitals:BP 152/88  Pulse 76  Resp 12  Ht 182.9 cm (6')  Wt (!) 170.6 kg (376 lb)  BMI 50.99 kg/m  Ht:182.9 cm (6') Wt:(!) 170.6 kg (376 lb) QAS:TMHD surface area is 2.94 meters squared. Body mass index is 50.99 kg/m.  Wt Readings from Last 3 Encounters:  09/12/16 (!) 170.6 kg (376 lb)  06/20/16 (!) 167.9 kg (370 lb 1.6 oz)  04/18/16 (!) 165.9 kg (365 lb 12.8 oz)   BP Readings from Last 3 Encounters:  09/12/16 152/88  06/20/16 144/82  04/18/16 (!) 148/90   General appearance appears in no acute distress  Head Mouth and Eye exam Normocephalic, without obvious abnormality, atraumatic Dentition is good Eyes appear anicteric   Neck exam Thyroid: normal  Nodes: no obvious adenopathy  LUNGS Breath Sounds: Normal Percussion: Normal  CARDIOVASCULAR JVP CV wave: no HJR: no Elevation at 90 degrees: None Carotid Pulse: normal pulsation bilaterally Bruit: None Apex: apical impulse normal  Auscultation Rhythm: normal sinus rhythm S1: normal S2: normal Clicks: no Rub: no Murmurs: no murmurs  Gallop: None ABDOMEN exam limited due to obesity Liver enlargement: no Pulsatile aorta: no Ascites: no Bruits: no  EXTREMITIES Clubbing: no Edema: 3+ bilateral pedal edema Pulses: peripheral pulses symmetrical Femoral Bruits: no Amputation: no SKIN Rash: no Cyanosis: no Embolic phemonenon: no Bruising: no NEURO Alert and Oriented to person, place and time: yes Non focal: yes  PSYCH: Pt appears to have normal affect  LABS REVIEWED Last 3 CBC  results: Lab Results  Component Value Date  WBC 9.7 11/18/2014   Lab Results  Component Value Date  HGB 16.2 11/18/2014   Lab Results  Component Value Date  HCT 45.8 11/18/2014   Lab Results  Component Value Date  PLT 189 11/18/2014   Lab Results  Component Value Date  CREATININE 1.0 04/18/2016  BUN 16 04/18/2016  NA 142 04/18/2016  K 4.1 04/18/2016  CL 106 04/18/2016  CO2 28.0 04/18/2016   Lab Results  Component Value Date  HDL 33.9 04/18/2016  HDL 34.1 10/20/2015  HDL 29.3 05/22/2015   Lab Results  Component Value Date  LDLCALC 108 04/18/2016  LDLCALC 112 10/20/2015  LDLCALC 58 05/22/2015   Lab Results  Component Value Date  TRIG 113 04/18/2016  TRIG 80 10/20/2015  TRIG 90 05/22/2015   Lab Results  Component Value Date  ALT 26 04/18/2016  AST 17 04/18/2016  ALKPHOS 87 04/18/2016   Assessment and Plan   66 y.o. male with  ICD-10-CM ICD-9-CM  1. Hypertension, essential-continue with current regimen, low-sodium diet. Weight loss is recommended. Some shortness of breath. Will repeat echocardiogram to evaluate LV function as well as a functional study to evaluate for ischemia. I10 401.9  2. Mixed hyperlipidemia-continue with current regimen including low-fat diet. Will continue with atorvastatin 20 mg daily. LDL goal less than 100 is recommended.  Current value was 108 E78.2 272.2  3. Essential hypertension I10 401.9  4. Acquired heart block-continue to follow pacemaker. Pacer is functioning normally. Continue to closely follow and make further recommendations after aforementioned studies are completed. I45.9 426.9   Return in about 4 weeks (around 10/10/2016).  These notes generated with voice recognition software. I apologize for typographical errors.  Sydnee Levans, MD       Plan of Treatment - as of this encounter  Upcoming Encounters Upcoming Encounters  Date Type Specialty Care Team Description  10/08/2016 Appointment Cardiology Fath,  Aloha Gell, MD  [facility]  [place], [place] [address]  [phone]  [phone] (Fax)    10/23/2016 Office Visit Family Medicine Feldpausch, Donzetta Matters., MD  [facility]  [place], [place] [address]  [phone]  [phone] (Fax)    12/05/2016 Procedure visit Cardiology Fath, Aloha Gell, MD  [facility]  [place], [place] [address]  [phone]  [phone] (Fax)    12/24/2016 Office Visit Cardiology Fath, Aloha Gell, MD  [facility]  [place], [place] [address]  [phone]  [phone] (Fax)     Scheduled Tests Scheduled Tests  Name Priority Associated Diagnoses Order Schedule  Echo complete Routine SOB (shortness of breath)  Expected: 09/26/2016, Expires: 09/12/2017  NM myocardial perfusion SPECT multiple (stress and rest) Routine Atypical chest pain  Expected: 09/26/2016, Expires: 09/12/2017  ECG stress test only Routine Atypical chest pain  1 Occurrences starting 09/12/2016 until 09/12/2017   Procedures - in this encounter  Procedure Name Priority Date/Time Associated Diagnosis Comments  ECG 12-LEAD Routine 09/12/2016 3:45 PM EDT SOB (shortness of breath)  Results for this procedure are in the results section.    Imaging Results - in this encounter   X-ray chest PA and lateral X-ray chest PA and lateral  Narrative Performed At  This result has an attachment that is not available.         ECG Results - in this encounter   ECG 12-lead ECG 12-lead  Component Value Ref Range Performed At  Vent Rate (bpm) 79  [facility] GE MUSE RESULTS  QRS Interval (msec) 174  [facility] GE MUSE RESULTS  QT Interval (msec) 454  [facility] GE MUSE RESULTS  QTc (msec) 520  [facility] GE MUSE RESULTS   ECG 12-lead  Narrative Performed At  This result has an attachment that is not available.  Ventricular-paced  rhythm Abnormal ECG When compared with ECG of 07-Dec-2015 09:51, No significant change was found I reviewed and concur with this report. Electronically signed TI:WPYK MD, KEN ([id number]) on 09/22/2016 4:00:08 PM   [facility] [place]    ECG 12-lead  Performing Organization Address City/State/Zipcode Phone Number  [place]          Visit Diagnoses   Diagnosis  SOB (shortness of breath) - Primary  Shortness of breath   Essential hypertension  Mixed hyperlipidemia  Acquired heart block  Unspecified conduction disorder   Atypical chest pain  Other chest pain    Discontinued Medications - as of this encounter  Prescription Sig. Discontinue Reason Start Date End Date  nortriptyline (PAMELOR) 10 MG capsule  Take 1 pill at night for one week then increase to 2 pills at night Discontinued by another clinician 08/01/2016 09/12/2016  gabapentin (NEURONTIN) 300 MG capsule  Takes Bid Discontinued by another clinician 04/20/2016 09/12/2016   Historical  Medications - added in this encounter  This list may reflect changes made after this encounter.  Medication Sig. Disp. Refills Start Date End Date  diazePAM (VALIUM) 5 MG tablet  Take 5 mg by mouth every 12 (twelve) hours as needed for Anxiety.      citalopram (CELEXA) 40 MG tablet  Take one tablet daily   06/23/2016    Images Document Information  Primary Care Provider Penni Homans Feldpausch MD (Apr. 07, 2015 - Present) [phone] (Work) [phone] (Fax) [place], [place] [address]  Document Coverage Dates Aug. 16, 2018  [place], [place] [address]   Encounter Providers Sydnee Levans MD (Attending) [phone] (Work) [phone] (Fax) [place],  [address]   Encounter Date Aug. 16, 2018

## 2016-09-26 NOTE — Pre-Procedure Instructions (Signed)
Table of Contents for Miscellaneous Notes Telephone Encounter - Kristie Cowman, LPN - 38/18/2993 7:16 PM EDT Telephone Encounter - Paylor, Mardene Celeste - 09/23/2016 2:14 PM EDT   Telephone Encounter - Kristie Cowman, LPN - 96/78/9381 0:17 PM EDT Called [facility] scheduling spoke to [place] and cancelled this myoview testing per pts request  Back to top of Miscellaneous Notes Telephone Encounter - Harold Barban - 09/23/2016 2:14 PM EDT Pt wants to cancel this Test at the hospital Thursday and Friday he is on pain meds and said it might be best, and he will r/s as soon this this gets straight.  Back to top of Miscellaneous Notes

## 2016-09-26 NOTE — Pre-Procedure Instructions (Signed)
DR FATH FAXED OVER CLEARANCE FOR STENT PLACEMENT-OPTIMIZED PER DR FATH-PLACED ON CHART AND WALKED COPY OVER TO AMY AT DR [place] OFFICE

## 2016-09-26 NOTE — Pre-Procedure Instructions (Signed)
CALLED DR Randa Lynn REGARDING PT WHO STATES HE HAD A STRESS TEST SCHEDULED TO DAY WITH DR Ubaldo Glassing DUE TO SOB BUT CANCELLED TEST DUE TO HAVING SURGERY TOMORROW AND BEING ON PAIN MEDS.  DR Randa Lynn AWARE THAT I HAVE CALLED DR FATHS OFFICE REGARDING THIS AND SENT FORM OVER TO SEE IF PT IS OK TO PROCEED WITH STENT PLACEMENT WITHOUT HAVING STRESS TEST DONE

## 2016-09-26 NOTE — Pre-Procedure Instructions (Signed)
ECG 12-lead8/16/2018 [place] Component Name Value Ref Range  Vent Rate (bpm) 79   QRS Interval (msec) 174   QT Interval (msec) 454   QTc (msec) 520   Result Narrative  Ventricular-paced rhythm Abnormal ECG When compared with ECG of 07-Dec-2015 09:51, No significant change was found I reviewed and concur with this report. Electronically signed OE:HOZY MD, KEN [phone]) on 09/22/2016 4:00:08 PM  Status Results Details    Office Visit on 09/12/2016 [place]")' [email or web address]/">Encounter Summary

## 2016-09-26 NOTE — Patient Instructions (Signed)
Your procedure is scheduled on: 09-27-16 Report to Same Day Surgery 2nd floor medical mall [facility] Entrance-take elevator on left to 2nd floor.  Check in with surgery information desk.) @ 9:30 AM   Remember: Instructions that are not followed completely may result in serious medical risk, up to and including death, or upon the discretion of your surgeon and anesthesiologist your surgery may need to be rescheduled.    _x___ 1. Do not eat food after midnight the night before your procedure. You may drink clear liquids up to 2 hours before you are scheduled to arrive at the hospital for your procedure.  Do not drink clear liquids within 2 hours of your scheduled arrival to the hospital.  Clear liquids include  --Water or Apple juice without pulp  --Clear carbohydrate beverage such as ClearFast or Gatorade  --Black Coffee or Clear Tea (No milk, no creamers, do not add anything to                  the coffee or Tea Type 1 and type 2 diabetics should only drink water.  No gum chewing or hard candies.     __x__ 2. No Alcohol for 24 hours before or after surgery.   __x__3. No Smoking for 24 prior to surgery.   ____  4. Bring all medications with you on the day of surgery if instructed.    __x__ 5. Notify your doctor if there is any change in your medical condition     (cold, fever, infections).     Do not wear jewelry, make-up, hairpins, clips or nail polish.  Do not wear lotions, powders, or perfumes. You may wear deodorant.  Do not shave 48 hours prior to surgery. Men may shave face and neck.  Do not bring valuables to the hospital.    [facility] is not responsible for any belongings or valuables.               Contacts, dentures or bridgework may not be worn into surgery.  Leave your suitcase in the car. After surgery it may be brought to your room.  For patients admitted to the hospital, discharge time is determined by your                       treatment team.   Patients  discharged the day of surgery will not be allowed to drive home.  You will need someone to drive you home and stay with you the night of your procedure.    Please read over the following fact sheets that you were given:   [facility] Preparing for Surgery and or MRSA Information   _x___ Take anti-hypertensive (unless it includes a diuretic), cardiac, seizure, asthma,     anti-reflux and psychiatric medicines. These include:  1. METOPROLOL  2.AMLODIPINE  3.HYDRALAZINE  4.CELEXA  5.MAY TAKE PERCOCET IF NEEDED  6.  ____Fleets enema or Magnesium Citrate as directed.   ____ Use CHG Soap or sage wipes as directed on instruction sheet   ____ Use inhalers on the day of surgery and bring to hospital day of surgery  ____ Stop Metformin and Janumet 2 days prior to surgery.    ____ Take 1/2 of usual insulin dose the night before surgery and none on the morning     surgery.   _x___ Follow recommendations from Cardiologist, Pulmonologist or PCP regarding stopping Aspirin, Coumadin, Plavix ,Eliquis, Effient, or Pradaxa, and Pletal-LAST DOSE OF ASA WAS TODAY (  09-26-16)  X____Stop Anti-inflammatories such as Advil, Aleve, Ibuprofen, Motrin, Naproxen, Naprosyn, Goodies powders or aspirin products NOW-OK to take Tylenol    ____ Stop supplements until after surgery.     _X___ Gennaro [place] to the hospital.

## 2016-09-26 NOTE — Progress Notes (Signed)
09/25/2016 10:42 AM   Richard Duncan [birth year] [id number]  Referring provider: Sofie Hartigan, MD [name], [name] [address]  Chief Complaint  Patient presents with  . Nephrolithiasis    HPI: 66 year old male with recurrent nephrolithiasis known to our practice who presented to the emergency room over the weekend with 2 days of severe right flank pain and hematuria. He is noted to have bilateral stones including a new 11 mm calculus in the left distal ureter, 10 mm in the left renal pelvis as well as a 6 mm stone in the right UPJ with proximal mild hydronephrosis.  His creatinine was mildly elevated from his baseline of 1.1-1.2, mild leukocytosis of 13, UA positive for only blood. His pain was able to be controlled and he was discharged with close urologic follow-up.  He continues to have right flank pain today. This is sharp at times but generally dull and achy. It comes and goes. It is able to manage with oral pain medications. He denies any left-sided flank pain.  No fevers or chills. No dysuria or UTI symptoms.  He's had multiple stone episodes but never required surgical retention the past.  He does also have a personal history of prostate cancer on active surveillance. He is followed most recently by Richard Duncan.     PMH: Past Medical History:  Diagnosis Date  . Anxiety   . Arthritis   . 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 Bilateral carpal tunnel syndrome   . Cancer [facility]) 2011   Prostate Ca  . Complication of anesthesia    urinary retention with Popanol  . Depression   . Difficult intubation   . ED (erectile dysfunction)   . History of blood product transfusion   . History of hiatal hernia   . History of kidney stones   . Hyperlipemia   . Hypertension   . Neuropathy   . Obesity   . Over weight   . Presence of permanent cardiac pacemaker    Medtronic  . Prostatitis   . PTSD (post-traumatic stress disorder)   . Sleep apnea    OSA--use Bi-PAP   . Urinary retention     Surgical History: Past Surgical History:  Procedure Laterality Date  . APPENDECTOMY    . [place], 2006   Lumbar Spine  . BACK SURGERY     x2 Laminectomy  . CARDIAC PACEMAKER PLACEMENT    . CERVICAL FUSION    . CERVICAL SPINE SURGERY N/A 2008  . COLONOSCOPY    . EXCISION OF ABDOMINAL WALL TUMOR N/A 03/08/2016   Procedure: EXCISION OF ABDOMINAL WALL MASS;  Surgeon: Leonie Green, MD;  Location: [facility] ORS;  Service: General;  Laterality: N/A;  . JOINT REPLACEMENT  2004, 2005, 2014, 2015   Bilat Hip Replacements  . SPINAL CORD STIMULATOR INSERTION N/A 07/08/2014   Procedure: LUMBAR SPINAL CORD STIMULATOR INSERTION;  Surgeon: Clydell Hakim, MD;  Location: [place];  Service: Neurosurgery;  Laterality: N/A;  LUMBAR SPINAL CORD STIMULATOR INSERTION  . TONSILLECTOMY      Home Medications:  Allergies as of 09/25/2016      Reactions   Propofol Other (See Comments)   URINARY RETENTION REQUIRING CATHETERIZATION   Propranolol Other (See Comments)   Urinary retention Urinary retention      Medication List       Accurate as of 09/25/16 11:59 PM. Always use your most recent med list.          amLODipine 10  MG tablet Commonly known as:  NORVASC Take 10 mg by mouth daily.   aspirin EC 81 MG tablet Take 81 mg by mouth daily.   benazepril 20 MG tablet Commonly known as:  LOTENSIN Take 20 mg by mouth daily.   citalopram 40 MG tablet Commonly known as:  CELEXA Take 40 mg by mouth daily.   diazepam 5 MG tablet Commonly known as:  VALIUM Take 5 mg by mouth daily as needed for anxiety.   hydrALAZINE 25 MG tablet Commonly known as:  APRESOLINE Take 25 mg by mouth 2 (two) times daily.   metoprolol tartrate 25 MG tablet Commonly known as:  LOPRESSOR Take 25 mg by mouth 2 (two) times daily.   oxyCODONE-acetaminophen 5-325 MG tablet Commonly known as:  ROXICET Take 1 tablet by mouth every 6 (six) hours as needed.   Vitamin D3 2000 units  capsule Take 2,000 Units by mouth daily.       Allergies:  Allergies  Allergen Reactions  . Propofol Other (See Comments)    URINARY RETENTION REQUIRING CATHETERIZATION  . Propranolol Other (See Comments)    Urinary retention Urinary retention    Family History: Family History  Problem Relation Age of Onset  . Benign prostatic hyperplasia Father   . Prostate cancer Father   . Hypertension Mother   . Kidney disease Neg Hx   . Kidney cancer Neg Hx   . Bladder Cancer Neg Hx     Social History:  reports that he quit smoking about 50 years ago. His smoking use included Cigarettes. He quit after 2.00 years of use. He has never used smokeless tobacco. He reports that he drinks alcohol. He reports that he does not use drugs.  ROS: UROLOGY Frequent Urination?: Yes Hard to postpone urination?: Yes Burning/pain with urination?: No Get up at night to urinate?: Yes Leakage of urine?: Yes Urine stream starts and stops?: No Trouble starting stream?: No Do you have to strain to urinate?: No Blood in urine?: No Urinary tract infection?: No Sexually transmitted disease?: No Injury to kidneys or bladder?: No Painful intercourse?: No Weak stream?: No Erection problems?: Yes Penile pain?: No  Gastrointestinal Nausea?: Yes Vomiting?: No Indigestion/heartburn?: No Diarrhea?: No Constipation?: No  Constitutional Fever: No Night sweats?: Yes Weight loss?: No Fatigue?: Yes  Skin Skin rash/lesions?: No Itching?: Yes  Eyes Blurred vision?: No Double vision?: No  Ears/Nose/Throat Sore throat?: No Sinus problems?: No  Hematologic/Lymphatic Swollen glands?: No Easy bruising?: No  Cardiovascular Leg swelling?: Yes Chest pain?: No  Respiratory Cough?: No Shortness of breath?: Yes  Endocrine Excessive thirst?: Yes  Musculoskeletal Back pain?: Yes Joint pain?: No  Neurological Headaches?: No Dizziness?: No  Psychologic Depression?: No Anxiety?:  Yes  Physical Exam: BP (!) 146/75 (BP Location: Left Arm, Patient Position: Sitting, Cuff Size: Large)   Pulse 79   Ht 6' (1.829 m)   Wt (!) 370 lb (167.8 kg)   BMI 50.18 kg/m   Constitutional:  Alert and oriented, No acute distress. HEENT: [place] AT, moist mucus membranes.  Trachea midline, no masses. Cardiovascular: No clubbing, cyanosis, or edema. Respiratory: Normal respiratory effort, no increased work of breathing. GI: Abdomen is soft, nontender, nondistended, no abdominal masses.  Obese.  GU: Mild right CVA tenderness. Skin: No rashes, bruises or suspicious lesions. Neurologic: Grossly intact, no focal deficits, moving all 4 extremities. Psychiatric: Normal mood and affect.  Laboratory Data: Lab Results  Component Value Date   WBC 13.4 (H) 09/22/2016   HGB 17.1 09/22/2016  HCT 50.0 09/22/2016   MCV 92.9 09/22/2016   PLT 188 09/22/2016    Lab Results  Component Value Date   CREATININE 1.20 09/22/2016    Lab Results  Component Value Date   PSA 2.56 05/24/2016   PSA 2.1 03/12/2014   Urinalysis    Component Value Date/Time   COLORURINE YELLOW (A) 09/22/2016 1010   APPEARANCEUR CLEAR (A) 09/22/2016 1010   APPEARANCEUR CLEAR 01/07/2014 0825   LABSPEC 1.013 09/22/2016 1010   LABSPEC 1.015 01/07/2014 0825   PHURINE 6.0 09/22/2016 1010   GLUCOSEU NEGATIVE 09/22/2016 1010   GLUCOSEU NEGATIVE 01/07/2014 0825   HGBUR LARGE (A) 09/22/2016 1010   BILIRUBINUR NEGATIVE 09/22/2016 1010   BILIRUBINUR NEGATIVE 01/07/2014 0825   KETONESUR NEGATIVE 09/22/2016 1010   PROTEINUR NEGATIVE 09/22/2016 1010   NITRITE NEGATIVE 09/22/2016 1010   LEUKOCYTESUR NEGATIVE 09/22/2016 1010   LEUKOCYTESUR NEGATIVE 01/07/2014 0825    Pertinent Imaging: CLINICAL DATA:  Right-sided flank pain and hematuria for 2 days. Nephrolithiasis.  EXAM: CT ABDOMEN AND PELVIS WITHOUT CONTRAST  TECHNIQUE: Multidetector CT imaging of the abdomen and pelvis was performed following the standard  protocol without IV contrast.  COMPARISON:  11/24/2014 and 09/24/2010  FINDINGS: Lower chest: No acute findings.  Hepatobiliary: No masses visualized on this unenhanced exam. Stable tiny sub-cm low-attenuation lesions in left hepatic lobe, most consistent with tiny cysts.  Pancreas: No mass or inflammatory process visualized on this unenhanced exam.  Spleen:  Within normal limits in size.  Adrenals/Urinary tract: Stable 1.8 cm low-attenuation left adrenal mass, consistent with benign adenoma.  Multiple small less than 1 cm renal calculi are seen bilaterally. Mild right hydronephrosis is seen due to 6 mm calculus at the right ureteropelvic junction.  A 10 mm calculus is seen in the left renal pelvis. 11 mm calculus is seen in the distal left ureter, without left hydroureteronephrosis. No bladder calculi identified.  Stomach/Bowel: No evidence of obstruction, inflammatory process, or abnormal fluid collections.  Vascular/Lymphatic: No pathologically enlarged lymph nodes identified. No evidence of abdominal aortic aneurysm.  Reproductive: Mild to moderate prostate enlargement, with median lobe hypertrophy indenting the bladder base.  Other: Significant beam hardening artifact through the inferior pelvis from bilateral hip prostheses.  Musculoskeletal:  No suspicious bone lesions identified.  IMPRESSION: Mild right hydronephrosis due to 6 mm calculus at right ureteropelvic junction.  11 mm distal left ureteral calculus and 10 mm calculus in left renal pelvis. No left hydroureteronephrosis.  Bilateral intrarenal calculi.  Stable small benign left adrenal adenoma.  Enlarged prostate.   Electronically Signed   By: Earle Gell M.D.   On: 09/22/2016 08:32  CT scan personally reviewed today and with the patient  Assessment & Plan:   1. Right ureteral stone Likely acute stone episode with associated pain and hydronephrosis No signs or  symptoms of infection Given the bilateral nature of his ureteral calculi including fairly bulky on the left side, I do think it is reasonable to go ahead and proceed with intervention. Given his habitus and bilateral nature of his stones, I do not feel that is a good candidate for shockwave lithotripsy.  I have recommended proceeding to the operating room for bilateral ureteroscopy, stent placement. He understands that he will likely need serial procedures given the size and location of the stones as well as his habitus. Risk and benefits were reviewed in detail the risk of bleeding, infection, damage to surround structures including damage to the ureter, need for multiple procedures, and discomfort from stent.  Risk of general anesthesia was also reviewed.  We will book him for Friday for his first staged procedure. In the interim, warning symptoms and indications for urgent intervention were reviewed.  2. Hydronephrosis of right kidney Secondary to #1  3. Left ureteral stone As above, likely more chronic and nonobstructive   4. Prostate cancer [facility]) Active surveillance- PSA due 10/18   Schedule bilateral ureteral stent placement, possible ureteroscopy with LL,   Richard Espy, MD  [facility] [address] [place], [place] [address] [phone]  I spent 25 min with this patient of which greater than 50% was spent in counseling and coordination of care with the patient.

## 2016-09-27 ENCOUNTER — Ambulatory Visit: Payer: Medicare Other | Admitting: Anesthesiology

## 2016-09-27 ENCOUNTER — Ambulatory Visit
Admission: RE | Admit: 2016-09-27 | Discharge: 2016-09-27 | Disposition: A | Discharge status: Home / Self Care | Payer: Medicare Other | Source: Ambulatory Visit | Attending: Urology | Admitting: Urology

## 2016-09-27 ENCOUNTER — Encounter: Payer: Self-pay | Admitting: *Deleted

## 2016-09-27 ENCOUNTER — Encounter
Admission: RE | Disposition: A | Discharge status: Home / Self Care | Payer: Self-pay | Source: Ambulatory Visit | Attending: Urology

## 2016-09-27 ENCOUNTER — Other Ambulatory Visit: Payer: Medicare Other

## 2016-09-27 DIAGNOSIS — Z7982 Long term (current) use of aspirin: Secondary | ICD-10-CM | POA: Insufficient documentation

## 2016-09-27 DIAGNOSIS — N529 Male erectile dysfunction, unspecified: Secondary | ICD-10-CM | POA: Insufficient documentation

## 2016-09-27 DIAGNOSIS — N400 Enlarged prostate without lower urinary tract symptoms: Secondary | ICD-10-CM | POA: Insufficient documentation

## 2016-09-27 DIAGNOSIS — Z79899 Other long term (current) drug therapy: Secondary | ICD-10-CM | POA: Diagnosis not present

## 2016-09-27 DIAGNOSIS — G4733 Obstructive sleep apnea (adult) (pediatric): Secondary | ICD-10-CM | POA: Diagnosis not present

## 2016-09-27 DIAGNOSIS — Z950 Presence of cardiac pacemaker: Secondary | ICD-10-CM | POA: Diagnosis not present

## 2016-09-27 DIAGNOSIS — Z888 Allergy status to other drugs, medicaments and biological substances status: Secondary | ICD-10-CM | POA: Diagnosis not present

## 2016-09-27 DIAGNOSIS — F329 Major depressive disorder, single episode, unspecified: Secondary | ICD-10-CM | POA: Insufficient documentation

## 2016-09-27 DIAGNOSIS — Z8546 Personal history of malignant neoplasm of prostate: Secondary | ICD-10-CM | POA: Insufficient documentation

## 2016-09-27 DIAGNOSIS — Z9989 Dependence on other enabling machines and devices: Secondary | ICD-10-CM | POA: Insufficient documentation

## 2016-09-27 DIAGNOSIS — Z6841 Body Mass Index (BMI) 40.0 and over, adult: Secondary | ICD-10-CM | POA: Diagnosis not present

## 2016-09-27 DIAGNOSIS — M1990 Unspecified osteoarthritis, unspecified site: Secondary | ICD-10-CM | POA: Diagnosis not present

## 2016-09-27 DIAGNOSIS — N202 Calculus of kidney with calculus of ureter: Secondary | ICD-10-CM | POA: Diagnosis not present

## 2016-09-27 DIAGNOSIS — E669 Obesity, unspecified: Secondary | ICD-10-CM | POA: Diagnosis not present

## 2016-09-27 DIAGNOSIS — Z884 Allergy status to anesthetic agent status: Secondary | ICD-10-CM | POA: Diagnosis not present

## 2016-09-27 DIAGNOSIS — F419 Anxiety disorder, unspecified: Secondary | ICD-10-CM | POA: Diagnosis not present

## 2016-09-27 DIAGNOSIS — Z87442 Personal history of urinary calculi: Secondary | ICD-10-CM | POA: Insufficient documentation

## 2016-09-27 DIAGNOSIS — N132 Hydronephrosis with renal and ureteral calculous obstruction: Secondary | ICD-10-CM | POA: Diagnosis present

## 2016-09-27 DIAGNOSIS — K219 Gastro-esophageal reflux disease without esophagitis: Secondary | ICD-10-CM | POA: Diagnosis not present

## 2016-09-27 DIAGNOSIS — F4310 Post-traumatic stress disorder, unspecified: Secondary | ICD-10-CM | POA: Diagnosis not present

## 2016-09-27 DIAGNOSIS — E785 Hyperlipidemia, unspecified: Secondary | ICD-10-CM | POA: Insufficient documentation

## 2016-09-27 DIAGNOSIS — Z87891 Personal history of nicotine dependence: Secondary | ICD-10-CM | POA: Diagnosis not present

## 2016-09-27 DIAGNOSIS — I10 Essential (primary) hypertension: Secondary | ICD-10-CM | POA: Insufficient documentation

## 2016-09-27 DIAGNOSIS — N201 Calculus of ureter: Secondary | ICD-10-CM

## 2016-09-27 HISTORY — PX: CYSTOSCOPY WITH STENT PLACEMENT: SHX5790

## 2016-09-27 SURGERY — CYSTOSCOPY, WITH STENT INSERTION
Anesthesia: General | Site: Ureter | Laterality: Bilateral | Wound class: Clean Contaminated

## 2016-09-27 MED ORDER — SUCCINYLCHOLINE CHLORIDE 20 MG/ML IJ SOLN
INTRAMUSCULAR | Status: AC
  Filled 2016-09-27: qty 1

## 2016-09-27 MED ORDER — FENTANYL CITRATE (PF) 100 MCG/2ML IJ SOLN
25.0000 ug | INTRAMUSCULAR | Status: DC | PRN
  Administered 2016-09-27 (×2): 50 ug via INTRAVENOUS

## 2016-09-27 MED ORDER — OXYCODONE-ACETAMINOPHEN 5-325 MG PO TABS: 1.0000 | ORAL_TABLET | ORAL | 0 refills | Status: DC | PRN

## 2016-09-27 MED ORDER — OXYBUTYNIN CHLORIDE 5 MG PO TABS
ORAL_TABLET | ORAL | Status: AC
  Filled 2016-09-27: qty 1

## 2016-09-27 MED ORDER — LIDOCAINE HCL (PF) 2 % IJ SOLN
INTRAMUSCULAR | Status: AC
  Filled 2016-09-27: qty 2

## 2016-09-27 MED ORDER — ONDANSETRON HCL 4 MG/2ML IJ SOLN
INTRAMUSCULAR | Status: DC | PRN
  Administered 2016-09-27: 4 mg via INTRAVENOUS

## 2016-09-27 MED ORDER — LIDOCAINE HCL (CARDIAC) 20 MG/ML IV SOLN
INTRAVENOUS | Status: DC | PRN
  Administered 2016-09-27: 100 mg via INTRAVENOUS

## 2016-09-27 MED ORDER — SUGAMMADEX SODIUM 500 MG/5ML IV SOLN
INTRAVENOUS | Status: AC
  Filled 2016-09-27: qty 5

## 2016-09-27 MED ORDER — LACTATED RINGERS IV SOLN
INTRAVENOUS | Status: DC | PRN
  Administered 2016-09-27: 10:00:00 via INTRAVENOUS

## 2016-09-27 MED ORDER — ROCURONIUM BROMIDE 50 MG/5ML IV SOLN
INTRAVENOUS | Status: AC
  Filled 2016-09-27: qty 1

## 2016-09-27 MED ORDER — ROCURONIUM BROMIDE 100 MG/10ML IV SOLN
INTRAVENOUS | Status: DC | PRN
  Administered 2016-09-27: 5 mg via INTRAVENOUS
  Administered 2016-09-27: 40 mg via INTRAVENOUS

## 2016-09-27 MED ORDER — CEPHALEXIN 500 MG PO CAPS: 500.0000 mg | ORAL_CAPSULE | Freq: Three times a day (TID) | ORAL | 0 refills | Status: DC

## 2016-09-27 MED ORDER — OXYCODONE HCL 5 MG PO TABS: 5.0000 mg | ORAL_TABLET | Freq: Once | ORAL | Status: DC | PRN

## 2016-09-27 MED ORDER — OXYCODONE HCL 5 MG/5ML PO SOLN: 5.0000 mg | Freq: Once | ORAL | Status: DC | PRN

## 2016-09-27 MED ORDER — FENTANYL CITRATE (PF) 100 MCG/2ML IJ SOLN
INTRAMUSCULAR | Status: AC
  Filled 2016-09-27: qty 2

## 2016-09-27 MED ORDER — PROPOFOL 10 MG/ML IV BOLUS
INTRAVENOUS | Status: AC
  Filled 2016-09-27: qty 20

## 2016-09-27 MED ORDER — FENTANYL CITRATE (PF) 100 MCG/2ML IJ SOLN
INTRAMUSCULAR | Status: DC | PRN
  Administered 2016-09-27: 50 ug via INTRAVENOUS
  Administered 2016-09-27: 25 ug via INTRAVENOUS

## 2016-09-27 MED ORDER — IOTHALAMATE MEGLUMINE 43 % IV SOLN
INTRAVENOUS | Status: DC | PRN
  Administered 2016-09-27: 50 mL via URETHRAL

## 2016-09-27 MED ORDER — SUGAMMADEX SODIUM 500 MG/5ML IV SOLN
INTRAVENOUS | Status: DC | PRN
  Administered 2016-09-27: 340 mg via INTRAVENOUS

## 2016-09-27 MED ORDER — PHENYLEPHRINE HCL 10 MG/ML IJ SOLN
INTRAMUSCULAR | Status: DC | PRN
  Administered 2016-09-27: 100 ug via INTRAVENOUS

## 2016-09-27 MED ORDER — LACTATED RINGERS IV SOLN
INTRAVENOUS | Status: DC
  Administered 2016-09-27: 10:00:00 via INTRAVENOUS

## 2016-09-27 MED ORDER — ONDANSETRON HCL 4 MG/2ML IJ SOLN
INTRAMUSCULAR | Status: AC
  Filled 2016-09-27: qty 2

## 2016-09-27 MED ORDER — ETOMIDATE 2 MG/ML IV SOLN
INTRAVENOUS | Status: AC
  Filled 2016-09-27: qty 20

## 2016-09-27 MED ORDER — FAMOTIDINE 20 MG PO TABS
20.0000 mg | ORAL_TABLET | Freq: Once | ORAL | Status: AC
  Administered 2016-09-27: 20 mg via ORAL

## 2016-09-27 MED ORDER — ETOMIDATE 2 MG/ML IV SOLN
INTRAVENOUS | Status: DC | PRN
  Administered 2016-09-27: 40 mg via INTRAVENOUS

## 2016-09-27 MED ORDER — DEXTROSE 5 % IV SOLN
3.0000 g | INTRAVENOUS | Status: AC
  Administered 2016-09-27: 3 g via INTRAVENOUS
  Filled 2016-09-27: qty 3000

## 2016-09-27 MED ORDER — SUCCINYLCHOLINE CHLORIDE 20 MG/ML IJ SOLN
INTRAMUSCULAR | Status: DC | PRN
  Administered 2016-09-27: 180 mg via INTRAVENOUS

## 2016-09-27 MED ORDER — OXYBUTYNIN CHLORIDE 5 MG PO TABS
5.0000 mg | ORAL_TABLET | Freq: Once | ORAL | Status: AC
  Administered 2016-09-27: 5 mg via ORAL

## 2016-09-27 SURGICAL SUPPLY — 35 items
BACTOSHIELD CHG 4% 4OZ (MISCELLANEOUS) ×2
BASKET ZERO TIP 1.9FR (BASKET) IMPLANT
CATH FOL 2WAY LX 20X30 (CATHETERS) ×4 IMPLANT
CATH FOLEY 3WAY 30CC 22FR (CATHETERS) ×4 IMPLANT
CATH URETL 5X70 OPEN END (CATHETERS) ×4 IMPLANT
CNTNR SPEC 2.5X3XGRAD LEK (MISCELLANEOUS)
CONRAY 43 FOR UROLOGY 50M (MISCELLANEOUS) ×4 IMPLANT
CONT SPEC 4OZ STER OR WHT (MISCELLANEOUS)
CONTAINER SPEC 2.5X3XGRAD LEK (MISCELLANEOUS) IMPLANT
FIBER LASER LITHO 273 (Laser) IMPLANT
GLOVE BIO SURGEON STRL SZ7 (GLOVE) ×8 IMPLANT
GLOVE BIO SURGEON STRL SZ7.5 (GLOVE) ×4 IMPLANT
GOWN STRL REUS W/ TWL LRG LVL3 (GOWN DISPOSABLE) ×2 IMPLANT
GOWN STRL REUS W/ TWL LRG LVL4 (GOWN DISPOSABLE) ×2 IMPLANT
GOWN STRL REUS W/TWL LRG LVL3 (GOWN DISPOSABLE) ×2
GOWN STRL REUS W/TWL LRG LVL4 (GOWN DISPOSABLE) ×2
GOWN STRL REUS W/TWL XL LVL4 (GOWN DISPOSABLE) ×4 IMPLANT
GUIDEWIRE SUPER STIFF (WIRE) IMPLANT
HOLDER FOLEY CATH W/STRAP (MISCELLANEOUS) ×4 IMPLANT
INTRODUCER DILATOR DOUBLE (INTRODUCER) IMPLANT
KIT RM TURNOVER CYSTO AR (KITS) ×4 IMPLANT
PACK CYSTO AR (MISCELLANEOUS) ×4 IMPLANT
PLUG CATH AND CAP STER (CATHETERS) ×4 IMPLANT
SCRUB CHG 4% DYNA-HEX 4OZ (MISCELLANEOUS) ×2 IMPLANT
SENSORWIRE 0.038 NOT ANGLED (WIRE) ×4
SET CYSTO W/LG BORE CLAMP LF (SET/KITS/TRAYS/PACK) ×4 IMPLANT
SHEATH URETERAL 13/15X36 1L (SHEATH) IMPLANT
SOL .9 NS 3000ML IRR  AL (IV SOLUTION) ×2
SOL .9 NS 3000ML IRR UROMATIC (IV SOLUTION) ×2 IMPLANT
STENT URET 6FRX24 CONTOUR (STENTS) IMPLANT
STENT URET 6FRX26 CONTOUR (STENTS) ×8 IMPLANT
SURGILUBE 2OZ TUBE FLIPTOP (MISCELLANEOUS) ×4 IMPLANT
SYRINGE IRR TOOMEY STRL 70CC (SYRINGE) IMPLANT
WATER STERILE IRR 1000ML POUR (IV SOLUTION) ×4 IMPLANT
WIRE SENSOR 0.038 NOT ANGLED (WIRE) ×2 IMPLANT

## 2016-09-27 NOTE — Transfer of Care (Signed)
Immediate Anesthesia Transfer of Care Note  Patient: Richard Duncan [place]  Procedure(s) Performed: Procedure(s): CYSTOSCOPY WITH STENT PLACEMENT (Bilateral)  Patient Location: PACU  Anesthesia Type:General  Level of Consciousness: awake, alert  and responds to stimulation  Airway & Oxygen Therapy: Patient Spontanous Breathing and Patient connected to face mask oxygen  Post-op Assessment: Report given to RN and Post -op Vital signs reviewed and stable  Post vital signs: Reviewed and stable  Last Vitals:  Vitals:   09/27/16 0929 09/27/16 1111  BP: (!) 146/71 (!) 142/79  Pulse: 79 75  Resp: 18 12  Temp: 36.7 C   SpO2: 97% 99%    Last Pain:  Vitals:   09/27/16 0929  TempSrc: Oral  PainSc: 7          Complications: No apparent anesthesia complications

## 2016-09-27 NOTE — Discharge Instructions (Signed)

## 2016-09-27 NOTE — H&P (View-Only) (Signed)
09/25/2016 10:42 AM   Richard Duncan September 24, 1950 [id number]  Referring provider: Sofie Hartigan, MD [place], [place] [address]  Chief Complaint  Patient presents with  . Nephrolithiasis    HPI: 66 year old male with recurrent nephrolithiasis known to our practice who presented to the emergency room over the weekend with 2 days of severe right flank pain and hematuria. He is noted to have bilateral stones including a new 11 mm calculus in the left distal ureter, 10 mm in the left renal pelvis as well as a 6 mm stone in the right UPJ with proximal mild hydronephrosis.  His creatinine was mildly elevated from his baseline of 1.1-1.2, mild leukocytosis of 13, UA positive for only blood. His pain was able to be controlled and he was discharged with close urologic follow-up.  He continues to have right flank pain today. This is sharp at times but generally dull and achy. It comes and goes. It is able to manage with oral pain medications. He denies any left-sided flank pain.  No fevers or chills. No dysuria or UTI symptoms.  He's had multiple stone episodes but never required surgical retention the past.  He does also have a personal history of prostate cancer on active surveillance. He is followed most recently by Richard Duncan.     PMH: Past Medical History:  Diagnosis Date  . Anxiety   . Arthritis   . 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 Bilateral carpal tunnel syndrome   . Cancer [facility]) 2011   Prostate Ca  . Complication of anesthesia    urinary retention with Popanol  . Depression   . Difficult intubation   . ED (erectile dysfunction)   . History of blood product transfusion   . History of hiatal hernia   . History of kidney stones   . Hyperlipemia   . Hypertension   . Neuropathy   . Obesity   . Over weight   . Presence of permanent cardiac pacemaker    Medtronic  . Prostatitis   . PTSD (post-traumatic stress disorder)   . Sleep apnea    OSA--use Bi-PAP   . Urinary retention     Surgical History: Past Surgical History:  Procedure Laterality Date  . APPENDECTOMY    . [place], 2006   Lumbar Spine  . BACK SURGERY     x2 Laminectomy  . CARDIAC PACEMAKER PLACEMENT    . CERVICAL FUSION    . CERVICAL SPINE SURGERY N/A 2008  . COLONOSCOPY    . EXCISION OF ABDOMINAL WALL TUMOR N/A 03/08/2016   Procedure: EXCISION OF ABDOMINAL WALL MASS;  Surgeon: Leonie Green, MD;  Location: [facility] ORS;  Service: General;  Laterality: N/A;  . JOINT REPLACEMENT  2004, 2005, 2014, 2015   Bilat Hip Replacements  . SPINAL CORD STIMULATOR INSERTION N/A 07/08/2014   Procedure: LUMBAR SPINAL CORD STIMULATOR INSERTION;  Surgeon: Clydell Hakim, MD;  Location: [place];  Service: Neurosurgery;  Laterality: N/A;  LUMBAR SPINAL CORD STIMULATOR INSERTION  . TONSILLECTOMY      Home Medications:  Allergies as of 09/25/2016      Reactions   Propofol Other (See Comments)   URINARY RETENTION REQUIRING CATHETERIZATION   Propranolol Other (See Comments)   Urinary retention Urinary retention      Medication List       Accurate as of 09/25/16 11:59 PM. Always use your most recent med list.          amLODipine 10  MG tablet Commonly known as:  NORVASC Take 10 mg by mouth daily.   aspirin EC 81 MG tablet Take 81 mg by mouth daily.   benazepril 20 MG tablet Commonly known as:  LOTENSIN Take 20 mg by mouth daily.   citalopram 40 MG tablet Commonly known as:  CELEXA Take 40 mg by mouth daily.   diazepam 5 MG tablet Commonly known as:  VALIUM Take 5 mg by mouth daily as needed for anxiety.   hydrALAZINE 25 MG tablet Commonly known as:  APRESOLINE Take 25 mg by mouth 2 (two) times daily.   metoprolol tartrate 25 MG tablet Commonly known as:  LOPRESSOR Take 25 mg by mouth 2 (two) times daily.   oxyCODONE-acetaminophen 5-325 MG tablet Commonly known as:  ROXICET Take 1 tablet by mouth every 6 (six) hours as needed.   Vitamin D3 2000 units  capsule Take 2,000 Units by mouth daily.       Allergies:  Allergies  Allergen Reactions  . Propofol Other (See Comments)    URINARY RETENTION REQUIRING CATHETERIZATION  . Propranolol Other (See Comments)    Urinary retention Urinary retention    Family History: Family History  Problem Relation Age of Onset  . Benign prostatic hyperplasia Father   . Prostate cancer Father   . Hypertension Mother   . Kidney disease Neg Hx   . Kidney cancer Neg Hx   . Bladder Cancer Neg Hx     Social History:  reports that he quit smoking about 50 years ago. His smoking use included Cigarettes. He quit after 2.00 years of use. He has never used smokeless tobacco. He reports that he drinks alcohol. He reports that he does not use drugs.  ROS: UROLOGY Frequent Urination?: Yes Hard to postpone urination?: Yes Burning/pain with urination?: No Get up at night to urinate?: Yes Leakage of urine?: Yes Urine stream starts and stops?: No Trouble starting stream?: No Do you have to strain to urinate?: No Blood in urine?: No Urinary tract infection?: No Sexually transmitted disease?: No Injury to kidneys or bladder?: No Painful intercourse?: No Weak stream?: No Erection problems?: Yes Penile pain?: No  Gastrointestinal Nausea?: Yes Vomiting?: No Indigestion/heartburn?: No Diarrhea?: No Constipation?: No  Constitutional Fever: No Night sweats?: Yes Weight loss?: No Fatigue?: Yes  Skin Skin rash/lesions?: No Itching?: Yes  Eyes Blurred vision?: No Double vision?: No  Ears/Nose/Throat Sore throat?: No Sinus problems?: No  Hematologic/Lymphatic Swollen glands?: No Easy bruising?: No  Cardiovascular Leg swelling?: Yes Chest pain?: No  Respiratory Cough?: No Shortness of breath?: Yes  Endocrine Excessive thirst?: Yes  Musculoskeletal Back pain?: Yes Joint pain?: No  Neurological Headaches?: No Dizziness?: No  Psychologic Depression?: No Anxiety?:  Yes  Physical Exam: BP (!) 146/75 (BP Location: Left Arm, Patient Position: Sitting, Cuff Size: Large)   Pulse 79   Ht 6' (1.829 m)   Wt (!) 370 lb (167.8 kg)   BMI 50.18 kg/m   Constitutional:  Alert and oriented, No acute distress. HEENT: [place] AT, moist mucus membranes.  Trachea midline, no masses. Cardiovascular: No clubbing, cyanosis, or edema. Respiratory: Normal respiratory effort, no increased work of breathing. GI: Abdomen is soft, nontender, nondistended, no abdominal masses.  Obese.  GU: Mild right CVA tenderness. Skin: No rashes, bruises or suspicious lesions. Neurologic: Grossly intact, no focal deficits, moving all 4 extremities. Psychiatric: Normal mood and affect.  Laboratory Data: Lab Results  Component Value Date   WBC 13.4 (H) 09/22/2016   HGB 17.1 09/22/2016  HCT 50.0 09/22/2016   MCV 92.9 09/22/2016   PLT 188 09/22/2016    Lab Results  Component Value Date   CREATININE 1.20 09/22/2016    Lab Results  Component Value Date   PSA 2.56 05/24/2016   PSA 2.1 03/12/2014   Urinalysis    Component Value Date/Time   COLORURINE YELLOW (A) 09/22/2016 1010   APPEARANCEUR CLEAR (A) 09/22/2016 1010   APPEARANCEUR CLEAR 01/07/2014 0825   LABSPEC 1.013 09/22/2016 1010   LABSPEC 1.015 01/07/2014 0825   PHURINE 6.0 09/22/2016 1010   GLUCOSEU NEGATIVE 09/22/2016 1010   GLUCOSEU NEGATIVE 01/07/2014 0825   HGBUR LARGE (A) 09/22/2016 1010   BILIRUBINUR NEGATIVE 09/22/2016 1010   BILIRUBINUR NEGATIVE 01/07/2014 0825   KETONESUR NEGATIVE 09/22/2016 1010   PROTEINUR NEGATIVE 09/22/2016 1010   NITRITE NEGATIVE 09/22/2016 1010   LEUKOCYTESUR NEGATIVE 09/22/2016 1010   LEUKOCYTESUR NEGATIVE 01/07/2014 0825    Pertinent Imaging: CLINICAL DATA:  Right-sided flank pain and hematuria for 2 days. Nephrolithiasis.  EXAM: CT ABDOMEN AND PELVIS WITHOUT CONTRAST  TECHNIQUE: Multidetector CT imaging of the abdomen and pelvis was performed following the standard  protocol without IV contrast.  COMPARISON:  11/24/2014 and 09/24/2010  FINDINGS: Lower chest: No acute findings.  Hepatobiliary: No masses visualized on this unenhanced exam. Stable tiny sub-cm low-attenuation lesions in left hepatic lobe, most consistent with tiny cysts.  Pancreas: No mass or inflammatory process visualized on this unenhanced exam.  Spleen:  Within normal limits in size.  Adrenals/Urinary tract: Stable 1.8 cm low-attenuation left adrenal mass, consistent with benign adenoma.  Multiple small less than 1 cm renal calculi are seen bilaterally. Mild right hydronephrosis is seen due to 6 mm calculus at the right ureteropelvic junction.  A 10 mm calculus is seen in the left renal pelvis. 11 mm calculus is seen in the distal left ureter, without left hydroureteronephrosis. No bladder calculi identified.  Stomach/Bowel: No evidence of obstruction, inflammatory process, or abnormal fluid collections.  Vascular/Lymphatic: No pathologically enlarged lymph nodes identified. No evidence of abdominal aortic aneurysm.  Reproductive: Mild to moderate prostate enlargement, with median lobe hypertrophy indenting the bladder base.  Other: Significant beam hardening artifact through the inferior pelvis from bilateral hip prostheses.  Musculoskeletal:  No suspicious bone lesions identified.  IMPRESSION: Mild right hydronephrosis due to 6 mm calculus at right ureteropelvic junction.  11 mm distal left ureteral calculus and 10 mm calculus in left renal pelvis. No left hydroureteronephrosis.  Bilateral intrarenal calculi.  Stable small benign left adrenal adenoma.  Enlarged prostate.   Electronically Signed   By: Earle Gell M.D.   On: 09/22/2016 08:32  CT scan personally reviewed today and with the patient  Assessment & Plan:   1. Right ureteral stone Likely acute stone episode with associated pain and hydronephrosis No signs or  symptoms of infection Given the bilateral nature of his ureteral calculi including fairly bulky on the left side, I do think it is reasonable to go ahead and proceed with intervention. Given his habitus and bilateral nature of his stones, I do not feel that is a good candidate for shockwave lithotripsy.  I have recommended proceeding to the operating room for bilateral ureteroscopy, stent placement. He understands that he will likely need serial procedures given the size and location of the stones as well as his habitus. Risk and benefits were reviewed in detail the risk of bleeding, infection, damage to surround structures including damage to the ureter, need for multiple procedures, and discomfort from stent.  Risk of general anesthesia was also reviewed.  We will book him for Friday for his first staged procedure. In the interim, warning symptoms and indications for urgent intervention were reviewed.  2. Hydronephrosis of right kidney Secondary to #1  3. Left ureteral stone As above, likely more chronic and nonobstructive   4. Prostate cancer [facility]) Active surveillance- PSA due 10/18   Schedule bilateral ureteral stent placement, possible ureteroscopy with LL,   Hollice Espy, MD  [facility] [address] [place], [place] [address] [phone]  I spent 25 min with this patient of which greater than 50% was spent in counseling and coordination of care with the patient.

## 2016-09-27 NOTE — Op Note (Signed)
Date of procedure: 09/27/16  Preoperative diagnosis:  1. Bilateral ureteral and renal stones   Postoperative diagnosis:  1. Same   Procedure: 1. Cystoscopy 2. Bilateral retrograde pyelogram with interpretation 3. Bilateral ureteral stent placement  Surgeon: Baruch Gouty, MD  Anesthesia: General  Complications: None  Intraoperative findings: The patient had a large intravesical lobe which made identifying the ureteral orifices somewhat difficult. Bilateral retrograde pyelograms were used to identify the collecting system. He did have J hooking bilaterally from his intravesical lobe. He had a fair amount of oozing from his prostate for manipulation of the intravesical lobe to access the ureters, so a Foley catheter was placed to help tamponade bleeding.  EBL: None  Specimens: None  Drains: Bilateral 6 French by 26 cm double-J ureteral stent  Disposition: Stable to the postanesthesia care unit  Indication for procedure: The patient is a 66 y.o. male with bilateral obstructing stones as well as nonobstructing renal stones presents today for bilateral ureteral stent placement for his bilateral obstructing stones and symptomatic right flank pain. No attempts were planned and made to address his stone burden due to his pending stress test. The plan will be to undergo definitive stone management with a stress test has been completed. After reviewing the management options for treatment, the patient elected to proceed with the above surgical procedure(s). We have discussed the potential benefits and risks of the procedure, side effects of the proposed treatment, the likelihood of the patient achieving the goals of the procedure, and any potential problems that might occur during the procedure or recuperation. Informed consent has been obtained.  Description of procedure: The patient was met in the preoperative area. All risks, benefits, and indications of the procedure were described in great  detail. The patient consented to the procedure. Preoperative antibiotics were given. The patient was taken to the operative theater. General anesthesia was induced per the anesthesia service. The patient was then placed in the dorsal lithotomy position and prepped and draped in the usual sterile fashion. A preoperative timeout was called.   Return [place] 30 cystoscope was inserted into the patient's bladder per urethra atraumatically. His bilateral ureteral orifices were difficult to visualize due to his intravesical lobe. He had to be manipulated in order to identify the lobes. A left retropyelogram was obtained once left ureter orifice was identified. It showed J hooking of his left ureter. A sensor wire was then advanced level of the renal pelvis under fluoroscopy and a 6 French by 26 cm double-J ureteral stent was placed. A curl seen in the renal pelvis under fluoroscopy and the urinary bladder under direct visualization. This process was repeated in identical fashion on the right side with J hooking also identified in the right side. This was difficult to do in the past and had to be manipulated particularly the median lobe. This led to the [facility] and his prostatic urethra at the end the procedure there was diffuse. Decisions by was to leave a 50 [place] three-way catheter with the third port plugged. This was placed with 30 cc in balloon. The patient was then awoke from anesthesia and transferred in stable condition to postanesthesia care unit.  Plan: The patient's we'll remove the Foley catheter by cutting the balloon portion in the morning. If his urine is still dark color, they will wait until his urine becomes light pink. She undergoes definitive stone management once he is cleared by cardiology after undergoing a stress test per   Baruch Gouty, M.D.

## 2016-09-27 NOTE — Anesthesia Procedure Notes (Signed)
Procedure Name: Intubation Performed by: Lance Muss Pre-anesthesia Checklist: Patient identified, Patient being monitored, Timeout performed, Emergency Drugs available and Suction available Patient Re-evaluated:Patient Re-evaluated prior to induction Oxygen Delivery Method: Circle system utilized Preoxygenation: Pre-oxygenation with 100% oxygen Induction Type: IV induction Ventilation: Mask ventilation with difficulty, Oral airway inserted - appropriate to patient size and Two handed mask ventilation required Laryngoscope Size: McGraph and 4 Grade View: Grade II Tube type: Oral Tube size: 7.5 mm Number of attempts: 1 Airway Equipment and Method: Stylet Placement Confirmation: ETT inserted through vocal cords under direct vision,  positive ETCO2 and breath sounds checked- equal and bilateral Secured at: 24 cm Tube secured with: Tape Dental Injury: Teeth and Oropharynx as per pre-operative assessment  Difficulty Due To: Difficult Airway- due to large tongue and Difficult Airway- due to anterior larynx Future Recommendations: Recommend- induction with short-acting agent, and alternative techniques readily available

## 2016-09-27 NOTE — OR Nursing (Signed)
Discussed with Dr. Pilar Jarvis pt advises "a lot of pressure down there"  Ditropan ordered and given.  Also, spouse went to office and picked up samples of spasm med for use at home, pt and spouse verbalize understanding this med is once a day and already had today.

## 2016-09-27 NOTE — Anesthesia Preprocedure Evaluation (Addendum)
Anesthesia Evaluation  Patient identified by MRN, date of birth, ID band Patient awake    Reviewed: Allergy & Precautions, H&P , NPO status , Patient's Chart, lab work & pertinent test results  History of Anesthesia Complications (+) DIFFICULT AIRWAY and history of anesthetic complications  Airway Mallampati: II  TM Distance: >3 FB Neck ROM: full    Dental  (+) Poor Dentition, Chipped   Pulmonary shortness of breath and with exertion, sleep apnea , former smoker,    Pulmonary exam normal breath sounds clear to auscultation       Cardiovascular Exercise Tolerance: Poor hypertension, (-) angina+ DOE  (-) Past MI Normal cardiovascular exam+ dysrhythmias + pacemaker  Rhythm:regular Rate:Normal     Neuro/Psych  Headaches, PSYCHIATRIC DISORDERS Anxiety Depression  Neuromuscular disease    GI/Hepatic Neg liver ROS, hiatal hernia, GERD  Medicated and Controlled,  Endo/Other  negative endocrine ROS  Renal/GU      Musculoskeletal  (+) Arthritis ,   Abdominal   Peds  Hematology negative hematology ROS (+)   Anesthesia Other Findings Past Medical History: No date: Anxiety No date: Arthritis No date: Bilateral carpal tunnel syndrome 2011: Cancer ([place])     Comment: Prostate Ca No date: Complication of anesthesia     Comment: urinary retention with Popanol No date: Depression No date: ED (erectile dysfunction) No date: History of blood product transfusion No date: History of hiatal hernia No date: History of kidney stones No date: Hyperlipemia No date: Hypertension No date: Neuropathy ([place]) No date: Obesity No date: Over weight No date: Presence of permanent cardiac pacemaker     Comment: [employer] No date: Prostatitis No date: PTSD (post-traumatic stress disorder) No date: Sleep apnea     Comment: OSA--use Bi-PAP No date: Urinary retention  Past Surgical History: No date: APPENDECTOMY 1988, 2006: BACK  SURGERY     Comment: Lumbar Spine No date: BACK SURGERY     Comment: x2 Laminectomy No date: CARDIAC PACEMAKER PLACEMENT No date: CERVICAL FUSION 2008: CERVICAL SPINE SURGERY N/A No date: COLONOSCOPY 2004, 2005, 2014, 2015: JOINT REPLACEMENT     Comment: Bilat Hip Replacements 07/08/2014: SPINAL CORD STIMULATOR INSERTION N/A     Comment: Procedure: LUMBAR SPINAL CORD STIMULATOR               INSERTION;  Surgeon: Clydell Hakim, MD;                Location: [facility];  Service: Neurosurgery;                Laterality: N/A;  LUMBAR SPINAL CORD STIMULATOR              INSERTION No date: TONSILLECTOMY     Reproductive/Obstetrics negative OB ROS                            Anesthesia Physical  Anesthesia Plan  ASA: IV  Anesthesia Plan: General LMA   Post-op Pain Management:    Induction: Intravenous  PONV Risk Score and Plan:   Airway Management Planned: Oral ETT and Video Laryngoscope Planned  Additional Equipment:   Intra-op Plan:   Post-operative Plan: Extubation in OR  Informed Consent: I have reviewed the patients History and Physical, chart, labs and discussed the procedure including the risks, benefits and alternatives for the proposed anesthesia with the patient or authorized representative who has indicated his/her understanding and acceptance.   Dental Advisory Given  Plan Discussed with: Anesthesiologist, CRNA  and Surgeon  Anesthesia Plan Comments: (Cardiology had requested a stress test for this patient that was scheduled for yesterday and today.  They cleared him once they found out that he was having a procedure today.  Per patient report cardiology did not want to damage his kidneys with their agents if he was obstructed.  I spoke with Dr. Pilar Jarvis about this, he feels that without any intervention the patient will become further obstructed and this will lead to more renal damage.  Dr. Pilar Jarvis reports that this would be a staged procedure  either way due to the location of the kidney stones.  Plan is to just stent the patient today, then patient can have his cardiac stress test and return to have his kidney stones treated.  Patient has cardiac clearance for this procedure.   Patient informed that they are higher risk for complications from anesthesia during this procedure due to their medical history and lack of stress test.  Patient voiced understanding.  Patient reports urinary retention with propofol but not with volatile anesthetics, plan to induce with etomidate Patient consented for risks of anesthesia including but not limited to:  - adverse reactions to medications - damage to teeth, lips or other oral mucosa - sore throat or hoarseness - Damage to heart, brain, lungs or loss of life  Patient voiced understanding.)      Anesthesia Quick Evaluation

## 2016-09-27 NOTE — Anesthesia Postprocedure Evaluation (Signed)
Anesthesia Post Note  Patient: Richard Duncan  Procedure(s) Performed: Procedure(s) (LRB): CYSTOSCOPY WITH STENT PLACEMENT (Bilateral)  Patient location during evaluation: PACU Anesthesia Type: General Level of consciousness: awake and alert Pain management: pain level controlled Vital Signs Assessment: post-procedure vital signs reviewed and stable Respiratory status: spontaneous breathing, nonlabored ventilation, respiratory function stable and patient connected to nasal cannula oxygen Cardiovascular status: blood pressure returned to baseline and stable Postop Assessment: no signs of nausea or vomiting Anesthetic complications: no     Last Vitals:  Vitals:   09/27/16 1159 09/27/16 1210  BP:  130/69  Pulse: 68 71  Resp: (!) 9 14  Temp: 36.7 C   SpO2: 95% 96%    Last Pain:  Vitals:   09/27/16 1159  TempSrc:   PainSc: Asleep                 Precious Haws Sheddrick Lattanzio

## 2016-09-27 NOTE — OR Nursing (Signed)
Foley cath drng watermelon colored urine

## 2016-09-27 NOTE — Interval H&P Note (Signed)
History and Physical Interval Note:  09/27/2016 9:47 AM  Richard Duncan  has presented today for surgery, with the diagnosis of bilateral obstructing ureteral stones  The various methods of treatment have been discussed with the patient and family. After consideration of risks, benefits and other options for treatment, the patient has consented to  Procedure(s): CYSTOSCOPY WITH STENT PLACEMENT (Bilateral) CYSTOSCOPY WITH RETROGRADE PYELOGRAM (Bilateral) URETEROSCOPY WITH HOLMIUM LASER LITHOTRIPSY (Bilateral) as a surgical intervention .  The patient's history has been reviewed, patient examined, no change in status, stable for surgery.  I have reviewed the patient's chart and labs.  Questions were answered to the patient's satisfaction.    RRR Lungs clear  Case is emergent due to bilateral ureteral obstruction with pending possible renal failure.   [name]

## 2016-09-27 NOTE — Anesthesia Post-op Follow-up Note (Signed)
Anesthesia QCDR form completed.        

## 2016-10-03 ENCOUNTER — Telehealth: Payer: Self-pay

## 2016-10-03 NOTE — Telephone Encounter (Signed)
Pt called stating he removed his foley on Monday after major bleeding stopped, but since then he has had urinary frequency as often as q44min. Pt then stated that he is supposed to have a nuclear stress test and was advised hes not able to have test due to urinary frequency. Spoke with nuclear medicine who stated pt was not advised could not have test. Pt cancelled test due to frequency. Nuclear medicine stated that pt urinary frequency would not affect their scans as pt would only be on the table a max of 8-62min at a time. Per Dr. Pilar Jarvis pt needs a PVR to rule out retention and then can have myrbetriq if not in retention. Spoke with pt and made aware will need a PVR and then depending on results may get myrbetriq samples to help with frequency. Also made pt aware nuclear medicine stated at most pt will be on table at a time is 8-10 min therefore frequency will not affect their tests. Reinforced with pt will need to have stress test done in order to have stents and stones removed. Offered pt nurse visit for PVR. Pt declined stating he needs to get stress test scheduled first. Pt will call back for nurse visit.

## 2016-10-10 ENCOUNTER — Ambulatory Visit: Payer: Medicare Other

## 2016-10-10 DIAGNOSIS — I5189 Other ill-defined heart diseases: Secondary | ICD-10-CM

## 2016-10-10 HISTORY — DX: Other ill-defined heart diseases: I51.89

## 2016-10-11 ENCOUNTER — Other Ambulatory Visit: Payer: Medicare Other

## 2016-10-14 ENCOUNTER — Ambulatory Visit
Admission: RE | Admit: 2016-10-14 | Discharge: 2016-10-14 | Disposition: A | Discharge status: Home / Self Care | Payer: Medicare Other | Source: Ambulatory Visit | Attending: Cardiology | Admitting: Cardiology

## 2016-10-14 DIAGNOSIS — R0602 Shortness of breath: Secondary | ICD-10-CM | POA: Insufficient documentation

## 2016-10-14 DIAGNOSIS — R0789 Other chest pain: Secondary | ICD-10-CM | POA: Diagnosis not present

## 2016-10-14 MED ORDER — TECHNETIUM TC 99M TETROFOSMIN IV KIT
31.4800 | PACK | Freq: Once | INTRAVENOUS | Status: AC | PRN
  Administered 2016-10-14: 31.48 via INTRAVENOUS

## 2016-10-14 MED ORDER — REGADENOSON 0.4 MG/5ML IV SOLN
0.4000 mg | Freq: Once | INTRAVENOUS | Status: AC
  Administered 2016-10-14: 0.4 mg via INTRAVENOUS

## 2016-10-24 ENCOUNTER — Telehealth: Payer: Self-pay

## 2016-10-24 NOTE — Telephone Encounter (Signed)
Pt called stating he had his nuclear stress test last week and someone from [facility] told him that they would contact Dr. Ubaldo Glassing, therefore he was inquiring about the results. Pt was unsure who he spoke to at [facility]. Made pt aware there was not a note in chart from nurse or MD about contacting Dr. Ubaldo Glassing. Therefore made pt aware would need to call Dr. Ubaldo Glassing himself and then would need the clearance faxed to Dr. Pilar Jarvis. Pt voiced understanding of whole conversation.

## 2016-10-25 ENCOUNTER — Other Ambulatory Visit: Payer: Self-pay | Admitting: Radiology

## 2016-10-25 DIAGNOSIS — N201 Calculus of ureter: Secondary | ICD-10-CM

## 2016-10-28 ENCOUNTER — Encounter
Admission: RE | Admit: 2016-10-28 | Discharge: 2016-10-28 | Disposition: A | Discharge status: Home / Self Care | Payer: Medicare Other | Source: Ambulatory Visit | Attending: Urology | Admitting: Urology

## 2016-10-28 DIAGNOSIS — N200 Calculus of kidney: Secondary | ICD-10-CM | POA: Insufficient documentation

## 2016-10-28 DIAGNOSIS — M5412 Radiculopathy, cervical region: Secondary | ICD-10-CM | POA: Insufficient documentation

## 2016-10-28 DIAGNOSIS — C61 Malignant neoplasm of prostate: Secondary | ICD-10-CM | POA: Insufficient documentation

## 2016-10-28 DIAGNOSIS — M5134 Other intervertebral disc degeneration, thoracic region: Secondary | ICD-10-CM | POA: Diagnosis not present

## 2016-10-28 DIAGNOSIS — Z01812 Encounter for preprocedural laboratory examination: Secondary | ICD-10-CM | POA: Diagnosis present

## 2016-10-28 DIAGNOSIS — M47812 Spondylosis without myelopathy or radiculopathy, cervical region: Secondary | ICD-10-CM | POA: Diagnosis not present

## 2016-10-28 DIAGNOSIS — G894 Chronic pain syndrome: Secondary | ICD-10-CM | POA: Insufficient documentation

## 2016-10-28 DIAGNOSIS — M47816 Spondylosis without myelopathy or radiculopathy, lumbar region: Secondary | ICD-10-CM | POA: Diagnosis not present

## 2016-10-28 DIAGNOSIS — I519 Heart disease, unspecified: Secondary | ICD-10-CM | POA: Diagnosis not present

## 2016-10-28 DIAGNOSIS — M4722 Other spondylosis with radiculopathy, cervical region: Secondary | ICD-10-CM | POA: Diagnosis not present

## 2016-10-28 DIAGNOSIS — M9983 Other biomechanical lesions of lumbar region: Secondary | ICD-10-CM | POA: Insufficient documentation

## 2016-10-28 DIAGNOSIS — R399 Unspecified symptoms and signs involving the genitourinary system: Secondary | ICD-10-CM | POA: Diagnosis not present

## 2016-10-28 DIAGNOSIS — N419 Inflammatory disease of prostate, unspecified: Secondary | ICD-10-CM | POA: Insufficient documentation

## 2016-10-28 DIAGNOSIS — R339 Retention of urine, unspecified: Secondary | ICD-10-CM | POA: Diagnosis not present

## 2016-10-28 DIAGNOSIS — E669 Obesity, unspecified: Secondary | ICD-10-CM | POA: Insufficient documentation

## 2016-10-28 DIAGNOSIS — G5793 Unspecified mononeuropathy of bilateral lower limbs: Secondary | ICD-10-CM | POA: Insufficient documentation

## 2016-10-28 DIAGNOSIS — M545 Low back pain: Secondary | ICD-10-CM | POA: Insufficient documentation

## 2016-10-28 DIAGNOSIS — M5136 Other intervertebral disc degeneration, lumbar region: Secondary | ICD-10-CM | POA: Insufficient documentation

## 2016-10-28 HISTORY — DX: Carpal tunnel syndrome, unspecified upper limb: G56.00

## 2016-10-28 HISTORY — DX: Diaphragmatic hernia without obstruction or gangrene: K44.9

## 2016-10-28 NOTE — Pre-Procedure Instructions (Signed)
Component Name Value Ref Range  LV Ejection Fraction (%) 55   Aortic Valve Regurgitation Grade none   Aortic Valve Stenosis Grade none   Aortic Valve Max Velocity (m/s) 1.2 m/sec  Aortic Valve Stenosis Mean Gradient (mmHg) 3.6 mmHg  Mitral Valve Regurgitation Grade trivial   Mitral Valve Stenosis Grade none   Tricuspid Valve Regurgitation Grade trivial   LV End Diastolic Diameter (cm) 6.1 cm  LV End Systolic Diameter (cm) 5.2 cm  LV Septum Wall Thickness (cm) 1.2 cm  LV Posterior Wall Thickness (cm) 0.81 cm  Left Atrium Diameter (cm) 3.8 cm  Result Narrative   [facility], Michele Mcalpine MEDICINE [id number] [address], [place], [place] [id number] #: [id number] Date: 10/08/2016 11:06 AM ECHOCARDIOGRAM REPORT Adult Male Age: 66 yrs Outpatient  STUDY:CHEST WALL TAPE:0000:00: 0:00:00 KC::KCMC ECHO:Yes DOPPLER:YesFILE:0000-000-000 MD1:FATH, KENNETH ALAN  COLOR:YesCONTRAST:No MACHINE:Accuson BP: 171/92 mmHg  RV BIOPSY:No 3D:NoSOUND QLTY:ModerateHeight: 72 in MEDIUM:None Weight: 374 lb BSA: 2.8 m2  ___________________________________________________________________________________________  HISTORY:DOE REASON:Assess, LV function INDICATION:R06.02 Shortness of breath ___________________________________________________________________________________________  ECHOCARDIOGRAPHIC MEASUREMENTS 2D DIMENSIONS AORTA ValuesNormal RangeMAIN  PAValuesNormal Range Annulus:nm* [2.3 - 2.9]PA Main:nm* [1.5 - 2.1] Aorta Sin:nm* [3.1 - 3.7] RIGHT VENTRICLE ST Junction:nm* [2.6 - 3.2]RV Base:nm* [ < 4.2] Asc.Aorta:nm* [2.6 - 3.4] RV Mid:3.8 cm[ < 3.5]  LEFT VENTRICLERV Length:nm* [ < 8.6] LVIDd:6.1 cm[4.2 - 5.9] INFERIOR VENA CAVA LVIDs:5.2 cmMax. IVC:nm* [ <= 2.1]  FS:15.0 %[> 25]Min. IVC:nm* SWT:1.2 cm[0.6 - 1.0] ------------------ PWT:0.81 cm [0.6 - 1.0] nm* - not measured  LEFT ATRIUM LA Diam:3.8 cm[3.0 - 4.0] LA A4C Area:nm* [ < 20] LA Volume:nm* Roice.Felt - 58] ___________________________________________________________________________________________  ECHOCARDIOGRAPHIC DESCRIPTIONS  AORTIC ROOT Size:Normal Dissection:INDETERM FOR DISSECTION  AORTIC VALVE Leaflets:Tricuspid Morphology:Normal Mobility:Fully mobile  LEFT VENTRICLE Size:NormalAnterior:Normal  Contraction:Normal Lateral:Normal Closest EF:>55% (Estimated)Septal:Normal  LV Masses:No Masses Apical:Normal  MLY:YTKPTWSFKCLE:XNTZGY Posterior:Normal Dias.FxClass:(Grade 1) relaxation abnormal, E/A reversal  MITRAL VALVE Leaflets:NormalMobility:Fully mobile Morphology:Normal  LEFT ATRIUM Size:Normal LA Masses:No masses  IA Septum:Normal IAS  MAIN PA Size:Normal  PULMONIC  VALVE Morphology:NormalMobility:Fully mobile  RIGHT VENTRICLE  RV Masses:No Masses Size:Normal  Free Wall:Normal Contraction:Normal  TRICUSPID VALVE Leaflets:NormalMobility:Fully mobile Morphology:Normal  RIGHT ATRIUM Size:NormalRA Other:None  RA Mass:No masses  PERICARDIUM  Fluid:No effusion  INFERIOR VENACAVA Size:Normal Normal respiratory collapse  ____________________________________________________________________  DOPPLER ECHO and OTHER SPECIAL PROCEDURES  Aortic:No AR No AS 122.2 cm/sec peak vel 6.0 mmHg peak grad 3.6 mmHg mean grad3.7 cm^2 by DOPPLER   Mitral:TRIVIAL MRNo MS MV Inflow E Vel=50.4 cm/sec MV Annulus E'Vel=7.6 cm/sec E/E'Ratio=6.6  Tricuspid:TRIVIAL TRNo TS  Pulmonary:TRIVIAL PRNo PS    ___________________________________________________________________________________________  INTERPRETATION NORMAL LEFT VENTRICULAR SYSTOLIC FUNCTION NORMAL RIGHT VENTRICULAR SYSTOLIC FUNCTION TRIVIAL REGURGITATION NOTED (See above) NO VALVULAR STENOSIS Closest EF: >55% (Estimated) Mitral: TRIVIAL MR Tricuspid: TRIVIAL TR  ___________________________________________________________________________________________  Electronically signed by: MD Jordan Hawks on 10/10/2016 08:31 AM Performed By: Johnathan Hausen, RDCS, RVT Ordering Physician: Bartholome Bill

## 2016-10-28 NOTE — Patient Instructions (Signed)
Your procedure is scheduled on: 11/01/16 Fri Report to Same Day Surgery 2nd floor medical mall [facility] Entrance-take elevator on left to 2nd floor.  Check in with surgery information desk.) To find out your arrival time please call [phone] between 1PM - 3PM on 10/31/16 Thurs  Remember: Instructions that are not followed completely may result in serious medical risk, up to and including death, or upon the discretion of your surgeon and anesthesiologist your surgery may need to be rescheduled.    _x___ 1. Do not eat food after midnight the night before your procedure. You may drink clear liquids up to 2 hours before you are scheduled to arrive at the hospital for your procedure.  Do not drink clear liquids within 2 hours of your scheduled arrival to the hospital.  Clear liquids include  --Water or Apple juice without pulp  --Clear carbohydrate beverage such as ClearFast or Gatorade  --Black Coffee or Clear Tea (No milk, no creamers, do not add anything to                  the coffee or Tea Type 1 and type 2 diabetics should only drink water.  No gum chewing or hard candies.     __x__ 2. No Alcohol for 24 hours before or after surgery.   __x__3. No Smoking for 24 prior to surgery.   ____  4. Bring all medications with you on the day of surgery if instructed.    __x__ 5. Notify your doctor if there is any change in your medical condition     (cold, fever, infections).     Do not wear jewelry, make-up, hairpins, clips or nail polish.  Do not wear lotions, powders, or perfumes. You may wear deodorant.  Do not shave 48 hours prior to surgery. Men may shave face and neck.  Do not bring valuables to the hospital.    [facility] is not responsible for any belongings or valuables.               Contacts, dentures or bridgework may not be worn into surgery.  Leave your suitcase in the car. After surgery it may be brought to your room.  For patients admitted to the hospital, discharge  time is determined by your                       treatment team.   Patients discharged the day of surgery will not be allowed to drive home.  You will need someone to drive you home and stay with you the night of your procedure.    Please read over the following fact sheets that you were given:   [facility] Preparing for Surgery and or MRSA Information   _x___ Take anti-hypertensive listed below, cardiac, seizure, asthma,     anti-reflux and psychiatric medicines. These include:  1. amLODipine (NORVASC) 10 MG   2.citalopram (CELEXA) 40 MG   3.hydrALAZINE (APRESOLINE) 25 MG tablet  4.metoprolol tartrate (LOPRESSOR) 25 MG tablet  5. oxyCODONE-acetaminophen (       6.  ____Fleets enema or Magnesium Citrate as directed.   _x___ Use CHG Soap or sage wipes as directed on instruction sheet   ____ Use inhalers on the day of surgery and bring to hospital day of surgery  ____ Stop Metformin and Janumet 2 days prior to surgery.    ____ Take 1/2 of usual insulin dose the night before surgery and none on the morning  surgery.   _x___ Follow recommendations from Cardiologist, Pulmonologist or PCP regarding          stopping Aspirin, Coumadin, Plavix ,Eliquis, Effient, or Pradaxa, and Pletal.  X____Stop Anti-inflammatories such as Advil, Aleve, Ibuprofen, Motrin, Naproxen, Naprosyn, Goodies powders or aspirin products. OK to take Tylenol and                          Celebrex.   _x___ Stop supplements until after surgery.  But may continue Vitamin D, Vitamin B,       and multivitamin.   _x__ Bring C-Pap to the hospital.

## 2016-10-28 NOTE — Pre-Procedure Instructions (Signed)
Component Name Value Ref Range  Vent Rate (bpm) 79   QRS Interval (msec) 174   QT Interval (msec) 454   QTc (msec) 520   Result Narrative  Ventricular-paced rhythm Abnormal ECG When compared with ECG of 07-Dec-2015 09:51, No significant change was found I reviewed and concur with this report. Electronically signed FT:NBZX MD, KEN ([id number]) on 09/22/2016 4:00:08 PM  Status Results Details

## 2016-10-28 NOTE — Pre-Procedure Instructions (Signed)
Cardiac clearance on chart. 

## 2016-10-29 LAB — URINE CULTURE: Culture: NO GROWTH

## 2016-10-31 MED ORDER — DEXTROSE 5 % IV SOLN
3.0000 g | INTRAVENOUS | Status: AC
  Administered 2016-11-01: 3 g via INTRAVENOUS
  Filled 2016-10-31: qty 3

## 2016-11-01 ENCOUNTER — Encounter: Payer: Self-pay | Admitting: Emergency Medicine

## 2016-11-01 ENCOUNTER — Ambulatory Visit: Payer: Medicare Other | Admitting: Anesthesiology

## 2016-11-01 ENCOUNTER — Ambulatory Visit
Admission: RE | Admit: 2016-11-01 | Discharge: 2016-11-01 | Disposition: A | Discharge status: Home / Self Care | Payer: Medicare Other | Source: Ambulatory Visit | Attending: Urology | Admitting: Urology

## 2016-11-01 ENCOUNTER — Encounter
Admission: RE | Disposition: A | Discharge status: Home / Self Care | Payer: Self-pay | Source: Ambulatory Visit | Attending: Urology

## 2016-11-01 DIAGNOSIS — N202 Calculus of kidney with calculus of ureter: Secondary | ICD-10-CM | POA: Insufficient documentation

## 2016-11-01 DIAGNOSIS — F4310 Post-traumatic stress disorder, unspecified: Secondary | ICD-10-CM | POA: Diagnosis not present

## 2016-11-01 DIAGNOSIS — I10 Essential (primary) hypertension: Secondary | ICD-10-CM | POA: Insufficient documentation

## 2016-11-01 DIAGNOSIS — N201 Calculus of ureter: Secondary | ICD-10-CM

## 2016-11-01 DIAGNOSIS — F329 Major depressive disorder, single episode, unspecified: Secondary | ICD-10-CM | POA: Diagnosis not present

## 2016-11-01 DIAGNOSIS — Z87442 Personal history of urinary calculi: Secondary | ICD-10-CM | POA: Insufficient documentation

## 2016-11-01 DIAGNOSIS — Z79899 Other long term (current) drug therapy: Secondary | ICD-10-CM | POA: Diagnosis not present

## 2016-11-01 DIAGNOSIS — Z950 Presence of cardiac pacemaker: Secondary | ICD-10-CM | POA: Diagnosis not present

## 2016-11-01 DIAGNOSIS — Z6841 Body Mass Index (BMI) 40.0 and over, adult: Secondary | ICD-10-CM | POA: Insufficient documentation

## 2016-11-01 DIAGNOSIS — Z87891 Personal history of nicotine dependence: Secondary | ICD-10-CM | POA: Insufficient documentation

## 2016-11-01 DIAGNOSIS — E785 Hyperlipidemia, unspecified: Secondary | ICD-10-CM | POA: Diagnosis not present

## 2016-11-01 DIAGNOSIS — K219 Gastro-esophageal reflux disease without esophagitis: Secondary | ICD-10-CM | POA: Diagnosis not present

## 2016-11-01 DIAGNOSIS — Z8546 Personal history of malignant neoplasm of prostate: Secondary | ICD-10-CM | POA: Diagnosis not present

## 2016-11-01 DIAGNOSIS — G4733 Obstructive sleep apnea (adult) (pediatric): Secondary | ICD-10-CM | POA: Diagnosis not present

## 2016-11-01 DIAGNOSIS — E669 Obesity, unspecified: Secondary | ICD-10-CM | POA: Diagnosis not present

## 2016-11-01 DIAGNOSIS — N135 Crossing vessel and stricture of ureter without hydronephrosis: Secondary | ICD-10-CM | POA: Diagnosis not present

## 2016-11-01 DIAGNOSIS — K449 Diaphragmatic hernia without obstruction or gangrene: Secondary | ICD-10-CM | POA: Insufficient documentation

## 2016-11-01 DIAGNOSIS — G629 Polyneuropathy, unspecified: Secondary | ICD-10-CM | POA: Diagnosis not present

## 2016-11-01 HISTORY — PX: URETEROSCOPY WITH HOLMIUM LASER LITHOTRIPSY: SHX6645

## 2016-11-01 HISTORY — PX: CYSTOSCOPY W/ URETERAL STENT PLACEMENT: SHX1429

## 2016-11-01 SURGERY — CYSTOSCOPY, FLEXIBLE, WITH STENT REPLACEMENT
Anesthesia: General | Laterality: Bilateral | Wound class: Clean Contaminated

## 2016-11-01 MED ORDER — ONDANSETRON HCL 4 MG/2ML IJ SOLN
INTRAMUSCULAR | Status: DC | PRN
  Administered 2016-11-01: 4 mg via INTRAVENOUS

## 2016-11-01 MED ORDER — ROCURONIUM BROMIDE 100 MG/10ML IV SOLN
INTRAVENOUS | Status: DC | PRN
  Administered 2016-11-01: 20 mg via INTRAVENOUS
  Administered 2016-11-01: 50 mg via INTRAVENOUS

## 2016-11-01 MED ORDER — OXYCODONE-ACETAMINOPHEN 5-325 MG PO TABS: 1.0000 | ORAL_TABLET | ORAL | 0 refills | Status: DC | PRN

## 2016-11-01 MED ORDER — FAMOTIDINE 20 MG PO TABS
ORAL_TABLET | ORAL | Status: AC
  Filled 2016-11-01: qty 1

## 2016-11-01 MED ORDER — PHENYLEPHRINE HCL 10 MG/ML IJ SOLN
INTRAMUSCULAR | Status: DC | PRN
  Administered 2016-11-01 (×5): 100 ug via INTRAVENOUS

## 2016-11-01 MED ORDER — EPHEDRINE SULFATE 50 MG/ML IJ SOLN
INTRAMUSCULAR | Status: DC | PRN
  Administered 2016-11-01 (×4): 10 mg via INTRAVENOUS

## 2016-11-01 MED ORDER — CEPHALEXIN 500 MG PO CAPS: 500.0000 mg | ORAL_CAPSULE | Freq: Three times a day (TID) | ORAL | 0 refills | Status: DC

## 2016-11-01 MED ORDER — FENTANYL CITRATE (PF) 100 MCG/2ML IJ SOLN
INTRAMUSCULAR | Status: AC
  Filled 2016-11-01: qty 2

## 2016-11-01 MED ORDER — MEPERIDINE HCL 50 MG/ML IJ SOLN: 6.2500 mg | INTRAMUSCULAR | Status: DC | PRN

## 2016-11-01 MED ORDER — LIDOCAINE HCL 2 % EX GEL
CUTANEOUS | Status: DC | PRN
  Administered 2016-11-01: 1 via TOPICAL

## 2016-11-01 MED ORDER — SUCCINYLCHOLINE CHLORIDE 20 MG/ML IJ SOLN
INTRAMUSCULAR | Status: AC
  Filled 2016-11-01: qty 1

## 2016-11-01 MED ORDER — LIDOCAINE HCL (PF) 2 % IJ SOLN
INTRAMUSCULAR | Status: AC
  Filled 2016-11-01: qty 10

## 2016-11-01 MED ORDER — FAMOTIDINE 20 MG PO TABS
20.0000 mg | ORAL_TABLET | Freq: Once | ORAL | Status: AC
  Administered 2016-11-01: 20 mg via ORAL

## 2016-11-01 MED ORDER — ETOMIDATE 2 MG/ML IV SOLN
INTRAVENOUS | Status: DC | PRN
  Administered 2016-11-01: 40 mg via INTRAVENOUS

## 2016-11-01 MED ORDER — LIDOCAINE HCL (CARDIAC) 20 MG/ML IV SOLN
INTRAVENOUS | Status: DC | PRN
  Administered 2016-11-01: 100 mg via INTRAVENOUS

## 2016-11-01 MED ORDER — OXYCODONE HCL 5 MG/5ML PO SOLN: 5.0000 mg | Freq: Once | ORAL | Status: DC | PRN

## 2016-11-01 MED ORDER — ROCURONIUM BROMIDE 50 MG/5ML IV SOLN
INTRAVENOUS | Status: AC
  Filled 2016-11-01: qty 1

## 2016-11-01 MED ORDER — ACETAMINOPHEN 10 MG/ML IV SOLN
INTRAVENOUS | Status: AC
  Filled 2016-11-01: qty 100

## 2016-11-01 MED ORDER — ONDANSETRON HCL 4 MG/2ML IJ SOLN
INTRAMUSCULAR | Status: AC
  Filled 2016-11-01: qty 2

## 2016-11-01 MED ORDER — SUGAMMADEX SODIUM 500 MG/5ML IV SOLN
INTRAVENOUS | Status: AC
  Filled 2016-11-01: qty 5

## 2016-11-01 MED ORDER — DEXAMETHASONE SODIUM PHOSPHATE 10 MG/ML IJ SOLN
INTRAMUSCULAR | Status: AC
  Filled 2016-11-01: qty 1

## 2016-11-01 MED ORDER — ACETAMINOPHEN 10 MG/ML IV SOLN
INTRAVENOUS | Status: DC | PRN
  Administered 2016-11-01: 1000 mg via INTRAVENOUS

## 2016-11-01 MED ORDER — MIDAZOLAM HCL 2 MG/2ML IJ SOLN
INTRAMUSCULAR | Status: AC
  Filled 2016-11-01: qty 2

## 2016-11-01 MED ORDER — MIDAZOLAM HCL 2 MG/2ML IJ SOLN
INTRAMUSCULAR | Status: DC | PRN
  Administered 2016-11-01: 2 mg via INTRAVENOUS

## 2016-11-01 MED ORDER — SUGAMMADEX SODIUM 500 MG/5ML IV SOLN
INTRAVENOUS | Status: DC | PRN
  Administered 2016-11-01: 500 mg via INTRAVENOUS

## 2016-11-01 MED ORDER — DEXAMETHASONE SODIUM PHOSPHATE 10 MG/ML IJ SOLN
INTRAMUSCULAR | Status: DC | PRN
  Administered 2016-11-01: 10 mg via INTRAVENOUS

## 2016-11-01 MED ORDER — LIDOCAINE HCL 2 % EX GEL
CUTANEOUS | Status: AC
  Filled 2016-11-01: qty 5

## 2016-11-01 MED ORDER — OXYCODONE HCL 5 MG PO TABS: 5.0000 mg | ORAL_TABLET | Freq: Once | ORAL | Status: DC | PRN

## 2016-11-01 MED ORDER — ETOMIDATE 2 MG/ML IV SOLN
INTRAVENOUS | Status: AC
  Filled 2016-11-01: qty 20

## 2016-11-01 MED ORDER — FENTANYL CITRATE (PF) 100 MCG/2ML IJ SOLN: 25.0000 ug | INTRAMUSCULAR | Status: DC | PRN

## 2016-11-01 MED ORDER — SUCCINYLCHOLINE CHLORIDE 20 MG/ML IJ SOLN
INTRAMUSCULAR | Status: DC | PRN
  Administered 2016-11-01: 180 mg via INTRAVENOUS

## 2016-11-01 MED ORDER — FENTANYL CITRATE (PF) 100 MCG/2ML IJ SOLN
INTRAMUSCULAR | Status: DC | PRN
  Administered 2016-11-01: 50 ug via INTRAVENOUS
  Administered 2016-11-01 (×2): 25 ug via INTRAVENOUS

## 2016-11-01 MED ORDER — PROMETHAZINE HCL 25 MG/ML IJ SOLN: 6.2500 mg | INTRAMUSCULAR | Status: DC | PRN

## 2016-11-01 MED ORDER — GLYCOPYRROLATE 0.2 MG/ML IJ SOLN
INTRAMUSCULAR | Status: AC
  Filled 2016-11-01: qty 1

## 2016-11-01 MED ORDER — LACTATED RINGERS IV SOLN
INTRAVENOUS | Status: DC
  Administered 2016-11-01: 09:00:00 via INTRAVENOUS

## 2016-11-01 SURGICAL SUPPLY — 30 items
BACTOSHIELD CHG 4% 4OZ (MISCELLANEOUS) ×2
BAG URINE DRAINAGE (UROLOGICAL SUPPLIES) ×3 IMPLANT
BASKET ZERO TIP 1.9FR (BASKET) ×3 IMPLANT
CATH FOL 2WAY LX 20X30 (CATHETERS) ×3 IMPLANT
CATH URETL 5X70 OPEN END (CATHETERS) ×3 IMPLANT
CNTNR SPEC 2.5X3XGRAD LEK (MISCELLANEOUS) ×1
CONT SPEC 4OZ STER OR WHT (MISCELLANEOUS) ×2
CONTAINER SPEC 2.5X3XGRAD LEK (MISCELLANEOUS) ×1 IMPLANT
FIBER LASER LITHO 273 (Laser) ×3 IMPLANT
GLOVE BIO SURGEON STRL SZ7 (GLOVE) ×3 IMPLANT
GLOVE BIO SURGEON STRL SZ7.5 (GLOVE) ×3 IMPLANT
GOWN STRL REUS W/ TWL LRG LVL4 (GOWN DISPOSABLE) ×1 IMPLANT
GOWN STRL REUS W/TWL LRG LVL4 (GOWN DISPOSABLE) ×2
GOWN STRL REUS W/TWL XL LVL4 (GOWN DISPOSABLE) ×3 IMPLANT
GUIDEWIRE SUPER STIFF (WIRE) IMPLANT
INTRODUCER DILATOR DOUBLE (INTRODUCER) ×3 IMPLANT
KIT RM TURNOVER CYSTO AR (KITS) ×3 IMPLANT
PACK CYSTO AR (MISCELLANEOUS) ×3 IMPLANT
SCRUB CHG 4% DYNA-HEX 4OZ (MISCELLANEOUS) ×1 IMPLANT
SENSORWIRE 0.038 NOT ANGLED (WIRE) ×3
SET CYSTO W/LG BORE CLAMP LF (SET/KITS/TRAYS/PACK) ×3 IMPLANT
SHEATH URETERAL 13/15X36 1L (SHEATH) ×3 IMPLANT
SOL .9 NS 3000ML IRR  AL (IV SOLUTION) ×2
SOL .9 NS 3000ML IRR UROMATIC (IV SOLUTION) ×1 IMPLANT
STENT URET 6FRX24 CONTOUR (STENTS) IMPLANT
STENT URET 6FRX26 CONTOUR (STENTS) ×3 IMPLANT
SURGILUBE 2OZ TUBE FLIPTOP (MISCELLANEOUS) ×3 IMPLANT
SYRINGE IRR TOOMEY STRL 70CC (SYRINGE) ×3 IMPLANT
WATER STERILE IRR 1000ML POUR (IV SOLUTION) ×3 IMPLANT
WIRE SENSOR 0.038 NOT ANGLED (WIRE) ×1 IMPLANT

## 2016-11-01 NOTE — Transfer of Care (Signed)
Immediate Anesthesia Transfer of Care Note  Patient: Richard Duncan  Procedure(s) Performed: CYSTOSCOPY WITH STENT REPLACEMENT (Bilateral ) URETEROSCOPY WITH HOLMIUM LASER LITHOTRIPSY (Bilateral )  Patient Location: PACU  Anesthesia Type:General  Level of Consciousness: awake, alert , oriented and patient cooperative  Airway & Oxygen Therapy: Patient Spontanous Breathing and Patient connected to face mask oxygen  Post-op Assessment: Report given to RN, Post -op Vital signs reviewed and stable and Patient moving all extremities X 4  Post vital signs: Reviewed and stable  Last Vitals:  Vitals:   11/01/16 0817 11/01/16 1138  BP: (!) 158/80 (P) 131/74  Pulse: 94   Resp: 20 (P) 18  Temp: 37.2 C (!) (P) 36.2 C  SpO2: 97%     Last Pain:  Vitals:   11/01/16 0817  TempSrc: Tympanic  PainSc: 7          Complications: No apparent anesthesia complications

## 2016-11-01 NOTE — Anesthesia Postprocedure Evaluation (Signed)
Anesthesia Post Note  Patient: Richard Duncan  Procedure(s) Performed: CYSTOSCOPY WITH STENT REPLACEMENT (Bilateral ) URETEROSCOPY WITH HOLMIUM LASER LITHOTRIPSY (Bilateral )  Patient location during evaluation: PACU Anesthesia Type: General Level of consciousness: awake and alert and oriented Pain management: pain level controlled Vital Signs Assessment: post-procedure vital signs reviewed and stable Respiratory status: spontaneous breathing, nonlabored ventilation and respiratory function stable Cardiovascular status: blood pressure returned to baseline and stable Postop Assessment: no signs of nausea or vomiting Anesthetic complications: no     Last Vitals:  Vitals:   11/01/16 1153 11/01/16 1208  BP: 112/69 115/62  Pulse: 88 72  Resp: 14 10  Temp:  (!) 36.3 C  SpO2: 97% 98%    Last Pain:  Vitals:   11/01/16 1138  TempSrc:   PainSc: 0-No pain                 Avereigh Spainhower

## 2016-11-01 NOTE — Anesthesia Preprocedure Evaluation (Signed)
Anesthesia Evaluation  Patient identified by MRN, date of birth, ID band Patient awake    Reviewed: Allergy & Precautions, NPO status , Patient's Chart, lab work & pertinent test results  History of Anesthesia Complications (+) DIFFICULT AIRWAY and history of anesthetic complications (previous intubation with videolaryngoscope, grade II view)  Airway Mallampati: III  TM Distance: >3 FB Neck ROM: Full    Dental no notable dental hx.    Pulmonary sleep apnea and Continuous Positive Airway Pressure Ventilation , neg COPD, former smoker,    breath sounds clear to auscultation- rhonchi (-) wheezing      Cardiovascular hypertension, Pt. on medications (-) CAD, (-) Past MI and (-) Cardiac Stents + pacemaker  Rhythm:Regular Rate:Normal - Systolic murmurs and - Diastolic murmurs Echo 1/61/09: NORMAL LEFT VENTRICULAR SYSTOLIC FUNCTION NORMAL RIGHT VENTRICULAR SYSTOLIC FUNCTION TRIVIAL REGURGITATION NOTED (See above) NO VALVULAR STENOSIS Closest EF: >55% (Estimated) Mitral: TRIVIAL MR Tricuspid: TRIVIAL TR   Neuro/Psych  Headaches, PSYCHIATRIC DISORDERS Anxiety Depression    GI/Hepatic Neg liver ROS, hiatal hernia, GERD  ,  Endo/Other  negative endocrine ROSneg diabetes  Renal/GU Renal InsufficiencyRenal disease (nephrolithiasis)     Musculoskeletal  (+) Arthritis ,   Abdominal (+) + obese,   Peds  Hematology negative hematology ROS (+)   Anesthesia Other Findings Past Medical History: No date: Anxiety No date: Arthritis 11/08/2013: Arthropathia No date: Bilateral carpal tunnel syndrome 2011: Cancer ([place])     Comment:  Prostate Ca No date: Carpal tunnel syndrome No date: Complication of anesthesia     Comment:  urinary retention with Popanol No date: Depression No date: Difficult intubation No date: Dyspnea     Comment:  RECENT-SUPPOSED TO HAVE STRESS TEST 09-26-16 WITH DR Ubaldo Glassing              BUT CANCELLED DUE TO  STENT PLACEMENT ON 09-27-16 No date: ED (erectile dysfunction) No date: GERD (gastroesophageal reflux disease)     Comment:  rare No date: Hernia, hiatal No date: History of blood product transfusion No date: History of hiatal hernia No date: History of kidney stones No date: Hyperlipemia No date: Hypertension No date: Neuropathy No date: Neuropathy No date: Obesity No date: Over weight No date: Presence of permanent cardiac pacemaker     Comment:  Medtronic No date: Prostatitis No date: PTSD (post-traumatic stress disorder) No date: Sleep apnea     Comment:  OSA--use Bi-PAP No date: Urinary retention   Reproductive/Obstetrics                             Anesthesia Physical Anesthesia Plan  ASA: III  Anesthesia Plan: General   Post-op Pain Management:    Induction: Intravenous  PONV Risk Score and Plan: 1 and Ondansetron and Dexamethasone  Airway Management Planned: Oral ETT  Additional Equipment:   Intra-op Plan:   Post-operative Plan: Extubation in OR  Informed Consent: I have reviewed the patients History and Physical, chart, labs and discussed the procedure including the risks, benefits and alternatives for the proposed anesthesia with the patient or authorized representative who has indicated his/her understanding and acceptance.   Dental advisory given  Plan Discussed with: CRNA and Anesthesiologist  Anesthesia Plan Comments:         Anesthesia Quick Evaluation

## 2016-11-01 NOTE — OR Nursing (Signed)
Pt declines offered leg bags.

## 2016-11-01 NOTE — Anesthesia Post-op Follow-up Note (Signed)
Anesthesia QCDR form completed.        

## 2016-11-01 NOTE — Op Note (Signed)
Date of procedure: 11/01/16  Preoperative diagnosis:  1. Bilateral ureteral and renal stones   Postoperative diagnosis:  1. Same   Procedure: 1. Cystoscopy 2. Right ureteroscopy 3. Laser lithotripsy 4. Stone basketing 5. Right retrograde pyelogram with interpretation 6. Right ureteral stent exchange 6 [place] by 26 cm  Surgeon: Baruch Gouty, MD  Anesthesia: General  Complications: None  Intraoperative findings: The patient had 2 very dense stones on the right side that were broken into small fragments and removed. Right retrograde pyelogram at the end the procedure showed no residual stone burden.  EBL: None  Specimens: Right renal stones to pathology  Drains: Right ureteral stent 6 French by 26 cm and 20 French Foley catheter. Left ureteral stent from previous operation was not manipulated.  Disposition: Stable to the postanesthesia care unit  Indication for procedure: The patient is a 66 y.o. male with history of bilateral ureteral renal stones who presents for definitive stone management after undergoing urgent bilateral ureteral stent placements for bilateral ureteral obstruction.  After reviewing the management options for treatment, the patient elected to proceed with the above surgical procedure(s). We have discussed the potential benefits and risks of the procedure, side effects of the proposed treatment, the likelihood of the patient achieving the goals of the procedure, and any potential problems that might occur during the procedure or recuperation. Informed consent has been obtained.  Description of procedure: The patient was met in the preoperative area. All risks, benefits, and indications of the procedure were described in great detail. The patient consented to the procedure. Preoperative antibiotics were given. The patient was taken to the operative theater. General anesthesia was induced per the anesthesia service. The patient was then placed in the dorsal  lithotomy position and prepped and draped in the usual sterile fashion. A preoperative timeout was called.   A 21 French 30 cystoscope was inserted in the patient's bladder per urethra atraumatically. The patient's previous placed right ureteral stent was grasped with flexible graspers and brought to level the urethral meatus. Of note is noted at his last procedure, he had very hypervascular and high riding bladder neck which made this difficult. A sensor wire was then exchanged through the stent up to the patient's right renal pelvis under fluoroscopy. The stent was removed. With very much difficulty and the aid of a second sensor wire a semirigid ureteroscope was eventually intubated the right ureteral orifice and pain ureteroscopy revealed no further stone burden. This suggests that his previous right UPJ stone had migrated back or was not back into the kidney. A second sensor wire was then placed in the semirigid ureteroscope was removed. A ureteral access sheath was then placed over the sensor wires under fluoroscopy atraumatically. Pan nephroscopy revealed 2 stones that were expected. Both her approximate 7 cm in the lower pole. These were then fragmented with laser lithotripsy. They were removed with a stone basket. Pan nephroscopy at this point revealed no further stone fragments greater than 2 mm. This was confirmed a right retrograde pyelogram which showed no filling defects. The ureteral access sheath was removed under direct visualization revealing no stones or significant trauma. Over the remaining sensor wire a 6 [place] by 26 cm double-J ureteral stent was placed with the aid of the cystoscope. A sensor was removed. A curl seen in the patient's right renal pelvis under fluoroscopy and in the urinary bladder under direct embolization. At this point, due to the very dense stone and difficulty accessing the stone due to the  patient's body habitus and anatomy, it had artery been over 100 minutes of  operative time. He also had much more significant stone burden on the left side. This point it was determined not to go after the stones on the left side due to the likelihood of the surgery take another 3 hours which put him at significantly higher risk for sepsis and iatrogenic nerve injury from positioning. Due to the patient's hyperacid prostate, high bladder neck, and unique anatomy, the decision was made to place a 20 [place] Foley catheter as he is needed at previous procedures. He also the history of urinary retention postoperatively. This was placed with 30 cc in the balloon with clear urine draining. This point, the patient was woke from anesthesia and transferred stable condition post anesthesia care unit.  Plan: The patient will be discharged home with his Foley catheter. He will remove the catheter in three days as he has done in the past. We will follow up for definitive surgical management on the left side as well as a right ureteral stent removal in a few weeks.  Baruch Gouty, M.D.

## 2016-11-01 NOTE — Discharge Instructions (Addendum)
AMBULATORY SURGERY  DISCHARGE INSTRUCTIONS   1) The drugs that you were given will stay in your system until tomorrow so for the next 24 hours you should not:  A) Drive an automobile B) Make any legal decisions C) Drink any alcoholic beverage   2) You may resume regular meals tomorrow.  Today it is better to start with liquids and gradually work up to solid foods.  You may eat anything you prefer, but it is better to start with liquids, then soup and crackers, and gradually work up to solid foods.   3) Please notify your doctor immediately if you have any unusual bleeding, trouble breathing, redness and pain at the surgery site, drainage, fever, or pain not relieved by medication.    4) Additional Instructions:Drink plenty of water.        Please contact your physician with any problems or Same Day Surgery at [phone], Monday through Friday 6 am to 4 pm, or [place] at [facility] number at [phone].

## 2016-11-01 NOTE — H&P (Signed)
11/01/2016 9:38 AM   Richard Duncan Mar 05, 1950 [id number]  CC: Bilateral nephrolithiasis  HPI: The patient is a 66 year old gentleman presents today for definitive surgical management after undergoing bilateral ureteral stent placement for bilateral ureteral and renal stones. He is noted to have bilateral stones including a new 11 mm calculus in the left distal ureter, 10 mm in the left renal pelvis as well as a 6 mm stone in the right UPJ with proximal mild hydronephrosis.    PMH: Past Medical History:  Diagnosis Date  . Anxiety   . Arthritis   . 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 Bilateral carpal tunnel syndrome   . Cancer [facility]) 2011   Prostate Ca  . Carpal tunnel syndrome   . Complication of anesthesia    urinary retention with Popanol  . Depression   . Difficult intubation   . Dyspnea    RECENT-SUPPOSED TO HAVE STRESS TEST 09-26-16 WITH DR Ubaldo Glassing BUT CANCELLED DUE TO STENT PLACEMENT ON 09-27-16  . ED (erectile dysfunction)   . GERD (gastroesophageal reflux disease)    rare  . Hernia, hiatal   . History of blood product transfusion   . History of hiatal hernia   . History of kidney stones   . Hyperlipemia   . Hypertension   . Neuropathy   . Neuropathy   . Obesity   . Over weight   . Presence of permanent cardiac pacemaker    [employer]tronic  . Prostatitis   . PTSD (post-traumatic stress disorder)   . Sleep apnea    OSA--use Bi-PAP  . Urinary retention     Surgical History: Past Surgical History:  Procedure Laterality Date  . APPENDECTOMY    . [place], 2006   Lumbar Spine  . BACK SURGERY     x2 Laminectomy  . CARDIAC PACEMAKER PLACEMENT    . CERVICAL FUSION    . CERVICAL SPINE SURGERY N/A 2008  . COLONOSCOPY    . CYSTOSCOPY WITH STENT PLACEMENT Bilateral 09/27/2016   Procedure: CYSTOSCOPY WITH STENT PLACEMENT;  Surgeon: [name], MD;  Location: [facility] ORS;  Service: Urology;  Laterality: Bilateral;  . EXCISION OF ABDOMINAL WALL TUMOR N/A  03/08/2016   Procedure: EXCISION OF ABDOMINAL WALL MASS;  Surgeon: Leonie Green, MD;  Location: [facility] ORS;  Service: General;  Laterality: N/A;  . JOINT REPLACEMENT  2004, 2005, 2014, 2015   Bilat Hip Replacements  . SPINAL CORD STIMULATOR INSERTION N/A 07/08/2014   Procedure: LUMBAR SPINAL CORD STIMULATOR INSERTION;  Surgeon: Clydell Hakim, MD;  Location: [place];  Service: Neurosurgery;  Laterality: N/A;  LUMBAR SPINAL CORD STIMULATOR INSERTION  . TONSILLECTOMY      Allergies:  Allergies  Allergen Reactions  . Propofol Other (See Comments)    URINARY RETENTION REQUIRING CATHETERIZATION  . Propranolol Other (See Comments)    Urinary retention Urinary retention    Family History: Family History  Problem Relation Age of Onset  . Benign prostatic hyperplasia Father   . Prostate cancer Father   . Hypertension Mother   . Kidney disease Neg Hx   . Kidney cancer Neg Hx   . Bladder Cancer Neg Hx     Social History:  reports that he quit smoking about 50 years ago. His smoking use included Cigarettes. He quit after 2.00 years of use. He has never used smokeless tobacco. He reports that he drinks alcohol. He reports that he does not use drugs.  ROS: 12 point review of systems otherwise negative  except for above  Physical Exam: BP (!) 158/80   Pulse 94   Temp 98.9 F (37.2 C) (Tympanic)   Resp 20   Ht 6\' 1"  (1.854 m)   Wt (!) 364 lb (165.1 kg)   SpO2 97%   BMI 48.02 kg/m   Constitutional:  Alert and oriented, No acute distress. HEENT: [place] AT, moist mucus membranes.  Trachea midline, no masses. Cardiovascular: No clubbing, cyanosis, or edema. RRR. Respiratory: Normal respiratory effort, no increased work of breathing. Lungs clear. GI: Abdomen is soft, nontender, nondistended, no abdominal masses GU: No CVA tenderness.  Skin: No rashes, bruises or suspicious lesions. Lymph: No cervical or inguinal adenopathy. Neurologic: Grossly intact, no focal deficits, moving all 4  extremities. Psychiatric: Normal mood and affect.  Laboratory Data: Lab Results  Component Value Date   WBC 13.4 (H) 09/22/2016   HGB 17.1 09/22/2016   HCT 50.0 09/22/2016   MCV 92.9 09/22/2016   PLT 188 09/22/2016    Lab Results  Component Value Date   CREATININE 1.20 09/22/2016    Lab Results  Component Value Date   PSA 2.56 05/24/2016   PSA 2.1 03/12/2014    No results found for: TESTOSTERONE  No results found for: HGBA1C  Urinalysis    Component Value Date/Time   COLORURINE YELLOW (A) 09/22/2016 1010   APPEARANCEUR CLEAR (A) 09/22/2016 1010   APPEARANCEUR CLEAR 01/07/2014 0825   LABSPEC 1.013 09/22/2016 1010   LABSPEC 1.015 01/07/2014 0825   PHURINE 6.0 09/22/2016 1010   GLUCOSEU NEGATIVE 09/22/2016 1010   GLUCOSEU NEGATIVE 01/07/2014 0825   HGBUR LARGE (A) 09/22/2016 1010   BILIRUBINUR NEGATIVE 09/22/2016 1010   BILIRUBINUR NEGATIVE 01/07/2014 0825   KETONESUR NEGATIVE 09/22/2016 1010   PROTEINUR NEGATIVE 09/22/2016 1010   NITRITE NEGATIVE 09/22/2016 1010   LEUKOCYTESUR NEGATIVE 09/22/2016 1010   LEUKOCYTESUR NEGATIVE 01/07/2014 0825     Assessment & Plan:   1. Bilateral nephrolithiasis We'll proceed with cystoscopy, bilateral ureteroscopy, laser lithotripsy, bilateral ureteral stent exchange  No Follow-up on file.  [name], MD  [facility] [address] [place], [place] [address] [phone]

## 2016-11-01 NOTE — Anesthesia Procedure Notes (Signed)
Procedure Name: Intubation Date/Time: 11/01/2016 9:56 AM Performed by: Doreen Salvage Pre-anesthesia Checklist: Patient identified, Patient being monitored, Timeout performed, Emergency Drugs available and Suction available Patient Re-evaluated:Patient Re-evaluated prior to induction Oxygen Delivery Method: Circle system utilized Preoxygenation: Pre-oxygenation with 100% oxygen Induction Type: IV induction Ventilation: Mask ventilation without difficulty and Oral airway inserted - appropriate to patient size Laryngoscope Size: Mac, McGraph and 4 Grade View: Grade I Tube type: Oral Tube size: 7.5 mm Number of attempts: 1 Airway Equipment and Method: Stylet Placement Confirmation: ETT inserted through vocal cords under direct vision,  positive ETCO2 and breath sounds checked- equal and bilateral Secured at: 25 cm Tube secured with: Tape Dental Injury: Teeth and Oropharynx as per pre-operative assessment  Difficulty Due To: Difficult Airway- due to anterior larynx and Difficult Airway- due to large tongue

## 2016-11-06 ENCOUNTER — Telehealth: Payer: Self-pay

## 2016-11-06 NOTE — Telephone Encounter (Signed)
-----   Message from [place], RN sent at 11/06/2016  8:49 AM EDT ----- Regarding: URS I was planning to schedule this on 10/19 ----- Message ----- From: [name], MD Sent: 11/01/2016  11:49 AM To: Ranell Patrick, RN  Patient will need cystoscopy with left ureteroscopy and laser lithotripsy as well as right ureteral stent removal in approximately 2 weeks. Please reserve at least 2.5 hours for this procedure. Thanks

## 2016-11-06 NOTE — Telephone Encounter (Signed)
Left pt mess to call 

## 2016-11-07 NOTE — Telephone Encounter (Signed)
Left patient mess to call back Patient's surgery has been scheduled on 11-15-16 and he will not need another pre-op

## 2016-11-08 ENCOUNTER — Other Ambulatory Visit: Payer: Medicare Other

## 2016-11-12 NOTE — Telephone Encounter (Signed)
Left mess on Sharon's number patient's emergency contact to call in regards to surgery

## 2016-11-13 ENCOUNTER — Other Ambulatory Visit: Payer: Self-pay

## 2016-11-13 LAB — STONE ANALYSIS
Ca Oxalate,Monohydr.: 92 %
Ca phos cry stone ql IR: 3 %
Stone Weight KSTONE: 261.3 mg
Uric Acid: 5 %

## 2016-11-13 NOTE — Pre-Procedure Instructions (Signed)
ECG 12-lead8/16/2018 [place] Component Name Value Ref Range  Vent Rate (bpm) 79   QRS Interval (msec) 174   QT Interval (msec) 454   QTc (msec) 520   Result Narrative  Ventricular-paced rhythm Abnormal ECG When compared with ECG of 07-Dec-2015 09:51, No significant change was found I reviewed and concur with this report. Electronically signed WU:[id number] MD, KEN ([id number]) on 09/22/2016 4:00:08 PM

## 2016-11-13 NOTE — Telephone Encounter (Signed)
Per Dr. Pilar Jarvis patient was notified that even though we did not need a pre-op apt , a urine culture was needed but this was not ordered. We will need to reschedule surgery to 11-27-16, patient will come on 10-22 to drop off a UA for culture. Patient verbalized understanding

## 2016-11-18 ENCOUNTER — Other Ambulatory Visit: Payer: Medicare Other

## 2016-11-18 DIAGNOSIS — N200 Calculus of kidney: Secondary | ICD-10-CM

## 2016-11-18 LAB — URINALYSIS, COMPLETE
Bilirubin, UA: NEGATIVE
Glucose, UA: NEGATIVE
Ketones, UA: NEGATIVE
Nitrite, UA: NEGATIVE
Specific Gravity, UA: 1.02 (ref 1.005–1.030)
Urobilinogen, Ur: 0.2 mg/dL (ref 0.2–1.0)
pH, UA: 5.5 (ref 5.0–7.5)

## 2016-11-18 LAB — MICROSCOPIC EXAMINATION: Epithelial Cells (non renal): NONE SEEN /hpf (ref 0–10)

## 2016-11-21 ENCOUNTER — Encounter: Payer: Self-pay | Admitting: *Deleted

## 2016-11-21 LAB — CULTURE, URINE COMPREHENSIVE

## 2016-11-22 ENCOUNTER — Ambulatory Visit: Payer: Medicare Other | Admitting: Urology

## 2016-11-26 MED ORDER — CEFAZOLIN SODIUM-DEXTROSE 2-4 GM/100ML-% IV SOLN
2.0000 g | Freq: Once | INTRAVENOUS | Status: AC
  Administered 2016-11-27: 2 g via INTRAVENOUS

## 2016-11-27 ENCOUNTER — Encounter: Payer: Self-pay | Admitting: *Deleted

## 2016-11-27 ENCOUNTER — Ambulatory Visit: Payer: Medicare Other | Admitting: Certified Registered Nurse Anesthetist

## 2016-11-27 ENCOUNTER — Ambulatory Visit
Admission: RE | Admit: 2016-11-27 | Discharge: 2016-11-27 | Disposition: A | Discharge status: Home / Self Care | Payer: Medicare Other | Source: Ambulatory Visit | Attending: Urology | Admitting: Urology

## 2016-11-27 ENCOUNTER — Encounter
Admission: RE | Disposition: A | Discharge status: Home / Self Care | Payer: Self-pay | Source: Ambulatory Visit | Attending: Urology

## 2016-11-27 DIAGNOSIS — Z87891 Personal history of nicotine dependence: Secondary | ICD-10-CM | POA: Diagnosis not present

## 2016-11-27 DIAGNOSIS — G5603 Carpal tunnel syndrome, bilateral upper limbs: Secondary | ICD-10-CM | POA: Diagnosis not present

## 2016-11-27 DIAGNOSIS — N200 Calculus of kidney: Secondary | ICD-10-CM

## 2016-11-27 DIAGNOSIS — K449 Diaphragmatic hernia without obstruction or gangrene: Secondary | ICD-10-CM | POA: Diagnosis not present

## 2016-11-27 DIAGNOSIS — G629 Polyneuropathy, unspecified: Secondary | ICD-10-CM | POA: Insufficient documentation

## 2016-11-27 DIAGNOSIS — Z888 Allergy status to other drugs, medicaments and biological substances status: Secondary | ICD-10-CM | POA: Insufficient documentation

## 2016-11-27 DIAGNOSIS — Z884 Allergy status to anesthetic agent status: Secondary | ICD-10-CM | POA: Insufficient documentation

## 2016-11-27 DIAGNOSIS — Z8051 Family history of malignant neoplasm of kidney: Secondary | ICD-10-CM | POA: Diagnosis not present

## 2016-11-27 DIAGNOSIS — Z969 Presence of functional implant, unspecified: Secondary | ICD-10-CM | POA: Diagnosis not present

## 2016-11-27 DIAGNOSIS — Z87442 Personal history of urinary calculi: Secondary | ICD-10-CM | POA: Diagnosis not present

## 2016-11-27 DIAGNOSIS — E669 Obesity, unspecified: Secondary | ICD-10-CM | POA: Insufficient documentation

## 2016-11-27 DIAGNOSIS — F4310 Post-traumatic stress disorder, unspecified: Secondary | ICD-10-CM | POA: Diagnosis not present

## 2016-11-27 DIAGNOSIS — G4733 Obstructive sleep apnea (adult) (pediatric): Secondary | ICD-10-CM | POA: Insufficient documentation

## 2016-11-27 DIAGNOSIS — F329 Major depressive disorder, single episode, unspecified: Secondary | ICD-10-CM | POA: Insufficient documentation

## 2016-11-27 DIAGNOSIS — Z6841 Body Mass Index (BMI) 40.0 and over, adult: Secondary | ICD-10-CM | POA: Insufficient documentation

## 2016-11-27 DIAGNOSIS — Z981 Arthrodesis status: Secondary | ICD-10-CM | POA: Diagnosis not present

## 2016-11-27 DIAGNOSIS — Z9049 Acquired absence of other specified parts of digestive tract: Secondary | ICD-10-CM | POA: Insufficient documentation

## 2016-11-27 DIAGNOSIS — N202 Calculus of kidney with calculus of ureter: Secondary | ICD-10-CM

## 2016-11-27 DIAGNOSIS — Z8546 Personal history of malignant neoplasm of prostate: Secondary | ICD-10-CM | POA: Insufficient documentation

## 2016-11-27 DIAGNOSIS — Z9889 Other specified postprocedural states: Secondary | ICD-10-CM | POA: Insufficient documentation

## 2016-11-27 DIAGNOSIS — N132 Hydronephrosis with renal and ureteral calculous obstruction: Secondary | ICD-10-CM | POA: Diagnosis not present

## 2016-11-27 DIAGNOSIS — Z950 Presence of cardiac pacemaker: Secondary | ICD-10-CM | POA: Diagnosis not present

## 2016-11-27 DIAGNOSIS — Z96643 Presence of artificial hip joint, bilateral: Secondary | ICD-10-CM | POA: Insufficient documentation

## 2016-11-27 DIAGNOSIS — F419 Anxiety disorder, unspecified: Secondary | ICD-10-CM | POA: Diagnosis not present

## 2016-11-27 DIAGNOSIS — I10 Essential (primary) hypertension: Secondary | ICD-10-CM | POA: Diagnosis not present

## 2016-11-27 HISTORY — PX: CYSTOSCOPY W/ URETERAL STENT REMOVAL: SHX1430

## 2016-11-27 HISTORY — DX: Anemia, unspecified: D64.9

## 2016-11-27 HISTORY — DX: Chronic kidney disease, unspecified: N18.9

## 2016-11-27 HISTORY — PX: URETEROSCOPY WITH HOLMIUM LASER LITHOTRIPSY: SHX6645

## 2016-11-27 SURGERY — URETEROSCOPY, WITH LITHOTRIPSY USING HOLMIUM LASER
Anesthesia: General | Laterality: Left | Wound class: Clean Contaminated

## 2016-11-27 MED ORDER — PROPOFOL 10 MG/ML IV BOLUS
INTRAVENOUS | Status: AC
  Filled 2016-11-27: qty 20

## 2016-11-27 MED ORDER — ETOMIDATE 2 MG/ML IV SOLN
INTRAVENOUS | Status: AC
  Filled 2016-11-27: qty 10

## 2016-11-27 MED ORDER — EPHEDRINE SULFATE 50 MG/ML IJ SOLN
INTRAMUSCULAR | Status: DC | PRN
  Administered 2016-11-27 (×2): 10 mg via INTRAVENOUS

## 2016-11-27 MED ORDER — ETOMIDATE 2 MG/ML IV SOLN
INTRAVENOUS | Status: DC | PRN
  Administered 2016-11-27: 40 mg via INTRAVENOUS

## 2016-11-27 MED ORDER — DEXAMETHASONE SODIUM PHOSPHATE 10 MG/ML IJ SOLN
INTRAMUSCULAR | Status: DC | PRN
  Administered 2016-11-27: 10 mg via INTRAVENOUS

## 2016-11-27 MED ORDER — SUCCINYLCHOLINE CHLORIDE 20 MG/ML IJ SOLN
INTRAMUSCULAR | Status: DC | PRN
  Administered 2016-11-27: 100 mg via INTRAVENOUS

## 2016-11-27 MED ORDER — FENTANYL CITRATE (PF) 100 MCG/2ML IJ SOLN
INTRAMUSCULAR | Status: DC | PRN
  Administered 2016-11-27 (×2): 50 ug via INTRAVENOUS

## 2016-11-27 MED ORDER — OXYCODONE-ACETAMINOPHEN 5-325 MG PO TABS: 1.0000 | ORAL_TABLET | ORAL | 0 refills | Status: DC | PRN

## 2016-11-27 MED ORDER — FAMOTIDINE 20 MG PO TABS
20.0000 mg | ORAL_TABLET | Freq: Once | ORAL | Status: AC
  Administered 2016-11-27: 20 mg via ORAL

## 2016-11-27 MED ORDER — ONDANSETRON HCL 4 MG/2ML IJ SOLN
INTRAMUSCULAR | Status: DC | PRN
  Administered 2016-11-27: 4 mg via INTRAVENOUS

## 2016-11-27 MED ORDER — DEXAMETHASONE SODIUM PHOSPHATE 10 MG/ML IJ SOLN
INTRAMUSCULAR | Status: AC
  Filled 2016-11-27: qty 1

## 2016-11-27 MED ORDER — FENTANYL CITRATE (PF) 100 MCG/2ML IJ SOLN
INTRAMUSCULAR | Status: AC
Start: 2016-11-27 — End: 2016-11-27
  Filled 2016-11-27: qty 2

## 2016-11-27 MED ORDER — ROCURONIUM BROMIDE 100 MG/10ML IV SOLN
INTRAVENOUS | Status: DC | PRN
  Administered 2016-11-27: 10 mg via INTRAVENOUS
  Administered 2016-11-27: 50 mg via INTRAVENOUS

## 2016-11-27 MED ORDER — FAMOTIDINE 20 MG PO TABS
ORAL_TABLET | ORAL | Status: AC
  Filled 2016-11-27: qty 1

## 2016-11-27 MED ORDER — ONDANSETRON HCL 4 MG/2ML IJ SOLN
INTRAMUSCULAR | Status: AC
  Filled 2016-11-27: qty 2

## 2016-11-27 MED ORDER — MIDAZOLAM HCL 2 MG/2ML IJ SOLN
INTRAMUSCULAR | Status: DC | PRN
  Administered 2016-11-27: 2 mg via INTRAVENOUS

## 2016-11-27 MED ORDER — LIDOCAINE HCL (PF) 2 % IJ SOLN
INTRAMUSCULAR | Status: AC
  Filled 2016-11-27: qty 10

## 2016-11-27 MED ORDER — PHENYLEPHRINE HCL 10 MG/ML IJ SOLN
INTRAMUSCULAR | Status: DC | PRN
  Administered 2016-11-27 (×3): 100 ug via INTRAVENOUS

## 2016-11-27 MED ORDER — ACETAMINOPHEN 10 MG/ML IV SOLN
INTRAVENOUS | Status: DC | PRN
  Administered 2016-11-27: 1000 mg via INTRAVENOUS

## 2016-11-27 MED ORDER — GLYCOPYRROLATE 0.2 MG/ML IJ SOLN
INTRAMUSCULAR | Status: AC
  Filled 2016-11-27: qty 1

## 2016-11-27 MED ORDER — CEFAZOLIN SODIUM-DEXTROSE 2-4 GM/100ML-% IV SOLN
INTRAVENOUS | Status: AC
  Filled 2016-11-27: qty 100

## 2016-11-27 MED ORDER — ONDANSETRON HCL 4 MG/2ML IJ SOLN: 4.0000 mg | Freq: Once | INTRAMUSCULAR | Status: DC | PRN

## 2016-11-27 MED ORDER — ROCURONIUM BROMIDE 50 MG/5ML IV SOLN
INTRAVENOUS | Status: AC
  Filled 2016-11-27: qty 1

## 2016-11-27 MED ORDER — FENTANYL CITRATE (PF) 100 MCG/2ML IJ SOLN: 25.0000 ug | INTRAMUSCULAR | Status: DC | PRN

## 2016-11-27 MED ORDER — CEPHALEXIN 500 MG PO CAPS: 500.0000 mg | ORAL_CAPSULE | Freq: Three times a day (TID) | ORAL | 0 refills | Status: DC

## 2016-11-27 MED ORDER — LIDOCAINE HCL (CARDIAC) 20 MG/ML IV SOLN
INTRAVENOUS | Status: DC | PRN
  Administered 2016-11-27: 100 mg via INTRAVENOUS

## 2016-11-27 MED ORDER — MIDAZOLAM HCL 2 MG/2ML IJ SOLN
INTRAMUSCULAR | Status: AC
  Filled 2016-11-27: qty 2

## 2016-11-27 MED ORDER — SUCCINYLCHOLINE CHLORIDE 20 MG/ML IJ SOLN
INTRAMUSCULAR | Status: AC
  Filled 2016-11-27: qty 1

## 2016-11-27 MED ORDER — LACTATED RINGERS IV SOLN
INTRAVENOUS | Status: DC
  Administered 2016-11-27: 10:00:00 via INTRAVENOUS

## 2016-11-27 MED ORDER — IOTHALAMATE MEGLUMINE 43 % IV SOLN
INTRAVENOUS | Status: DC | PRN
  Administered 2016-11-27: 20 mL via URETHRAL

## 2016-11-27 MED ORDER — SUGAMMADEX SODIUM 500 MG/5ML IV SOLN
INTRAVENOUS | Status: DC | PRN
  Administered 2016-11-27: 500 mg via INTRAVENOUS

## 2016-11-27 SURGICAL SUPPLY — 30 items
BACTOSHIELD CHG 4% 4OZ (MISCELLANEOUS) ×2
BASKET ZERO TIP 1.9FR (BASKET) ×4 IMPLANT
CATH FOL 2WAY LX 20X30 (CATHETERS) ×4 IMPLANT
CATH URETL 5X70 OPEN END (CATHETERS) ×8 IMPLANT
CNTNR SPEC 2.5X3XGRAD LEK (MISCELLANEOUS) ×2
CONT SPEC 4OZ STER OR WHT (MISCELLANEOUS) ×2
CONTAINER SPEC 2.5X3XGRAD LEK (MISCELLANEOUS) ×2 IMPLANT
FIBER LASER 365 (Laser) ×4 IMPLANT
FIBER LASER LITHO 273 (Laser) ×4 IMPLANT
GLOVE BIO SURGEON STRL SZ7 (GLOVE) ×4 IMPLANT
GLOVE BIO SURGEON STRL SZ7.5 (GLOVE) ×4 IMPLANT
GOWN STRL REUS W/ TWL LRG LVL4 (GOWN DISPOSABLE) ×2 IMPLANT
GOWN STRL REUS W/TWL LRG LVL4 (GOWN DISPOSABLE) ×2
GOWN STRL REUS W/TWL XL LVL4 (GOWN DISPOSABLE) ×4 IMPLANT
GUIDEWIRE SUPER STIFF (WIRE) IMPLANT
INTRODUCER DILATOR DOUBLE (INTRODUCER) ×4 IMPLANT
KIT RM TURNOVER CYSTO AR (KITS) ×4 IMPLANT
PACK CYSTO AR (MISCELLANEOUS) ×4 IMPLANT
SCRUB CHG 4% DYNA-HEX 4OZ (MISCELLANEOUS) ×2 IMPLANT
SENSORWIRE 0.038 NOT ANGLED (WIRE) ×8
SET CYSTO W/LG BORE CLAMP LF (SET/KITS/TRAYS/PACK) ×4 IMPLANT
SHEATH URETERAL 13/15X36 1L (SHEATH) ×4 IMPLANT
SOL .9 NS 3000ML IRR  AL (IV SOLUTION) ×4
SOL .9 NS 3000ML IRR UROMATIC (IV SOLUTION) ×4 IMPLANT
STENT URET 6FRX24 CONTOUR (STENTS) IMPLANT
STENT URET 6FRX26 CONTOUR (STENTS) ×4 IMPLANT
SURGILUBE 2OZ TUBE FLIPTOP (MISCELLANEOUS) ×4 IMPLANT
SYRINGE IRR TOOMEY STRL 70CC (SYRINGE) ×4 IMPLANT
WATER STERILE IRR 1000ML POUR (IV SOLUTION) ×4 IMPLANT
WIRE SENSOR 0.038 NOT ANGLED (WIRE) ×4 IMPLANT

## 2016-11-27 NOTE — Op Note (Signed)
Date of procedure: 11/27/16  Preoperative diagnosis:  1. Left ureteral stone 2. Left renal stones   Postoperative diagnosis:  1. Same   Procedure: 1. Cystoscopy 2. Right ureteral stent removal 3. Right ureteroscopy 4. Laser lithotripsy 5. Stone basketing 6. Left retrograde pyelogram with interpretation 7. Left ureteral stent exchange 6 [place] by 26 cm  Surgeon: Baruch Gouty, MD  Anesthesia: General  Complications: None  Intraoperative findings: The patient's right ureteral stent was successfully removed. He had a very dense 1.5 cm distal ureteral calculus that was impacted. It was broken into small fragments the larger fragments were removed.He had 2 large lef renal stones. One was approximatelty 1.2 cm and located in the mid pole. He also had an approximately 1 cm stone in the left lower pole. These were Only partially removed today over a 2 hour period. Surgery was ended with visualization was no longer possible. Left retrograde pyelogram was obtained to aid in left ureteral stent placement.  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  EBL: none  Specimens: None  Drains: Left ureteral stent 6 French by 26 cm, 20 French Foley catheter  Disposition: Stable to the postanesthesia care unit  Indication for procedure: The patient is a 66 y.o. male with history of of bilateral ureteral and renal stones who previously underwent a extended right ureteroscopy.  He presents today for right ureteral stent removal and to address the left-sided stone burden.  After reviewing the management options for treatment, the patient elected to proceed with the above surgical procedure(s). We have discussed the potential benefits and risks of the procedure, side effects of the proposed treatment, the likelihood of the patient achieving the goals of the procedure, and any potential problems that  might occur during the procedure or recuperation. Informed consent has been obtained.  Description of procedure: The patient was met in the preoperative area. All risks, benefits, and indications of the procedure were described in great detail. The patient consented to the procedure. Preoperative antibiotics were given. The patient was taken to the operative theater. General anesthesia was induced per the anesthesia service. The patient was then placed in the dorsal lithotomy position and prepped and draped in the usual sterile fashion. A preoperative timeout was called.   A 21 French 30 degree cystoscope was inserted into the patient's bladder per urethra atraumatically.  The right ureteral stent was grasped with flexible graspers and removed.  The left wrist stent was then brought to level urethral meatus and a sensor wire to exchange the level of the left renal pelvis under fluoroscopy.  A semirigid ureteroscope was introduced and the ureter there is an impacted 1.5 cm left ureteral calculus.  This is broken a smaller fragments with laser lithotripsy.  The larger fragments were removed.  Due to the impaction was difficult to continue to remove all the smaller fragments was felt there would eventually pass away due to their small size.  A second sensor wire was then advanced to level of the left renal pelvis under fluoroscopy.  The ureteroscope was withdrawn.  A ureteral access sheath was then placed over 1 of the sensor wires.  Through the access sheath, the flexible ureteroscope was advanced.  He had approximately 1.2 cm stone in the midpole and a 1 cm stone in the left lower pole.  For the next hour and a half the stones were addressed with the mixture  of laser lithotripsy and stone basketing.  They are very hard consistent with the patient's known calcium oxalate monohydrate stones.  After approximately an hour and a half, visualization was difficult and he still had approximately 25% of his stone burden  remaining.  He did have some pieces that were still too large to remove.  1 of these pieces had been relocated from the lower pole to the upper pole for easier lithotripsy and removal.  At this point given that surgery had been going on over 2 hours and the fact visualization was now limited due to minor venous oozing from stone and laser lithotripsy manipulation, decision was made to end the procedure to avoid injury.  At this point our left retrograde pyelogram was obtained which showed the collecting system for stent placement.  The ureteral access sheath was removed under direct causation revealing no significant trauma.  However there were some stone fragments still in the distal left ureter which appeared to be less than 2 mm.  At this point over the remaining sensor wire 6 French by 26 cm double-J ureteral stent was placed and the sensor wire was removed with the aid of the cystoscope.  A curl was seen in the patient's left renal pelvis under fluoroscopy and the urinary bladder under direct visualization.  At this point the bladder was drained and stone fragments in the bladder were removed.  The prostate as it had in previous surgeries did have oozing due to his large size and a large median lobe.  As an last procedure, 69 French Foley catheter was placed to tamponade this.  At this point the patient was awoken from anesthesia and transferred in stable condition to the postanesthesia care unit.  Plan: The patient will undergo repeat staged left ureteroscopy in approximately 2 weeks.  He will remove his Foley catheter by cutting in the balloon port as he has done in the past to remove his Foley when it clears.  Baruch Gouty, M.D.

## 2016-11-27 NOTE — Interval H&P Note (Signed)
History and Physical Interval Note:  11/27/2016 10:26 AM  Richard Duncan  has presented today for surgery, with the diagnosis of LEFT URETERAL STONE  The various methods of treatment have been discussed with the patient and family. After consideration of risks, benefits and other options for treatment, the patient has consented to  Procedure(s): URETEROSCOPY WITH HOLMIUM LASER LITHOTRIPSY (Left) CYSTOSCOPY WITH STENT REMOVAL/EXCHANGE (Bilateral) as a surgical intervention .  The patient's history has been reviewed, patient examined, no change in status, stable for surgery.  I have reviewed the patient's chart and labs.  Questions were answered to the patient's satisfaction.    RRR Lungs clear  [name]

## 2016-11-27 NOTE — Progress Notes (Signed)
Urine visably clearer on discharge

## 2016-11-27 NOTE — Progress Notes (Signed)
Vital signs  BP 151/92   pulse97 and sat 96 percent

## 2016-11-27 NOTE — Anesthesia Post-op Follow-up Note (Signed)
Anesthesia QCDR form completed.        

## 2016-11-27 NOTE — Addendum Note (Signed)
Addendum  created 11/27/16 1352 by Alvin Critchley, MD   Order list changed, Order sets accessed

## 2016-11-27 NOTE — Interval H&P Note (Signed)
History and Physical Interval Note:  11/27/2016 10:28 AM  Richard Duncan  has presented today for surgery, with the diagnosis of LEFT URETERAL STONE  The various methods of treatment have been discussed with the patient and family. After consideration of risks, benefits and other options for treatment, the patient has consented to  Procedure(s): URETEROSCOPY WITH HOLMIUM LASER LITHOTRIPSY (Left) CYSTOSCOPY WITH STENT REMOVAL/EXCHANGE (Bilateral) as a surgical intervention .  The patient's history has been reviewed, patient examined, no change in status, stable for surgery.  I have reviewed the patient's chart and labs.  Questions were answered to the patient's satisfaction.    Plan to remove right ureteral stent today and address residual left ureteral stone burden via URS.  [name]

## 2016-11-27 NOTE — H&P (View-Only) (Signed)
11/01/2016 9:38 AM   Richard Duncan [birth year] [id number]  CC: Bilateral nephrolithiasis  HPI: The patient is a 66 year old gentleman presents today for definitive surgical management after undergoing bilateral ureteral stent placement for bilateral ureteral and renal stones. He is noted to have bilateral stones including a new 11 mm calculus in the left distal ureter, 10 mm in the left renal pelvis as well as a 6 mm stone in the right UPJ with proximal mild hydronephrosis.    PMH: Past Medical History:  Diagnosis Date  . Anxiety   . Arthritis   . 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 Bilateral carpal tunnel syndrome   . Cancer [facility]) 2011   Prostate Ca  . Carpal tunnel syndrome   . Complication of anesthesia    urinary retention with Popanol  . Depression   . Difficult intubation   . Dyspnea    RECENT-SUPPOSED TO HAVE STRESS TEST 09-26-16 WITH DR Ubaldo Glassing BUT CANCELLED DUE TO STENT PLACEMENT ON 09-27-16  . ED (erectile dysfunction)   . GERD (gastroesophageal reflux disease)    rare  . Hernia, hiatal   . History of blood product transfusion   . History of hiatal hernia   . History of kidney stones   . Hyperlipemia   . Hypertension   . Neuropathy   . Neuropathy   . Obesity   . Over weight   . Presence of permanent cardiac pacemaker    Medtronic  . Prostatitis   . PTSD (post-traumatic stress disorder)   . Sleep apnea    OSA--use Bi-PAP  . Urinary retention     Surgical History: Past Surgical History:  Procedure Laterality Date  . APPENDECTOMY    . [place], 2006   Lumbar Spine  . BACK SURGERY     x2 Laminectomy  . CARDIAC PACEMAKER PLACEMENT    . CERVICAL FUSION    . CERVICAL SPINE SURGERY N/A 2008  . COLONOSCOPY    . CYSTOSCOPY WITH STENT PLACEMENT Bilateral 09/27/2016   Procedure: CYSTOSCOPY WITH STENT PLACEMENT;  Surgeon: [name], MD;  Location: [facility];  Service: Urology;  Laterality: Bilateral;  . EXCISION OF ABDOMINAL WALL TUMOR N/A  03/08/2016   Procedure: EXCISION OF ABDOMINAL WALL MASS;  Surgeon: Leonie Green, MD;  Location: [facility];  Service: General;  Laterality: N/A;  . JOINT REPLACEMENT  2004, 2005, 2014, 2015   Bilat Hip Replacements  . SPINAL CORD STIMULATOR INSERTION N/A 07/08/2014   Procedure: LUMBAR SPINAL CORD STIMULATOR INSERTION;  Surgeon: Clydell Hakim, MD;  Location: [place];  Service: Neurosurgery;  Laterality: N/A;  LUMBAR SPINAL CORD STIMULATOR INSERTION  . TONSILLECTOMY      Allergies:  Allergies  Allergen Reactions  . Propofol Other (See Comments)    URINARY RETENTION REQUIRING CATHETERIZATION  . Propranolol Other (See Comments)    Urinary retention Urinary retention    Family History: Family History  Problem Relation Age of Onset  . Benign prostatic hyperplasia Father   . Prostate cancer Father   . Hypertension Mother   . Kidney disease Neg Hx   . Kidney cancer Neg Hx   . Bladder Cancer Neg Hx     Social History:  reports that he quit smoking about 50 years ago. His smoking use included Cigarettes. He quit after 2.00 years of use. He has never used smokeless tobacco. He reports that he drinks alcohol. He reports that he does not use drugs.  ROS: 12 point review of systems otherwise negative  except for above  Physical Exam: BP (!) 158/80   Pulse 94   Temp 98.9 F (37.2 C) (Tympanic)   Resp 20   Ht 6\' 1"  (1.854 m)   Wt (!) 364 lb (165.1 kg)   SpO2 97%   BMI 48.02 kg/m   Constitutional:  Alert and oriented, No acute distress. HEENT: [place] AT, moist mucus membranes.  Trachea midline, no masses. Cardiovascular: No clubbing, cyanosis, or edema. RRR. Respiratory: Normal respiratory effort, no increased work of breathing. Lungs clear. GI: Abdomen is soft, nontender, nondistended, no abdominal masses GU: No CVA tenderness.  Skin: No rashes, bruises or suspicious lesions. Lymph: No cervical or inguinal adenopathy. Neurologic: Grossly intact, no focal deficits, moving all 4  extremities. Psychiatric: Normal mood and affect.  Laboratory Data: Lab Results  Component Value Date   WBC 13.4 (H) 09/22/2016   HGB 17.1 09/22/2016   HCT 50.0 09/22/2016   MCV 92.9 09/22/2016   PLT 188 09/22/2016    Lab Results  Component Value Date   CREATININE 1.20 09/22/2016    Lab Results  Component Value Date   PSA 2.56 05/24/2016   PSA 2.1 03/12/2014    No results found for: TESTOSTERONE  No results found for: HGBA1C  Urinalysis    Component Value Date/Time   COLORURINE YELLOW (A) 09/22/2016 1010   APPEARANCEUR CLEAR (A) 09/22/2016 1010   APPEARANCEUR CLEAR 01/07/2014 0825   LABSPEC 1.013 09/22/2016 1010   LABSPEC 1.015 01/07/2014 0825   PHURINE 6.0 09/22/2016 1010   GLUCOSEU NEGATIVE 09/22/2016 1010   GLUCOSEU NEGATIVE 01/07/2014 0825   HGBUR LARGE (A) 09/22/2016 1010   BILIRUBINUR NEGATIVE 09/22/2016 1010   BILIRUBINUR NEGATIVE 01/07/2014 0825   KETONESUR NEGATIVE 09/22/2016 1010   PROTEINUR NEGATIVE 09/22/2016 1010   NITRITE NEGATIVE 09/22/2016 1010   LEUKOCYTESUR NEGATIVE 09/22/2016 1010   LEUKOCYTESUR NEGATIVE 01/07/2014 0825     Assessment & Plan:   1. Bilateral nephrolithiasis We'll proceed with cystoscopy, bilateral ureteroscopy, laser lithotripsy, bilateral ureteral stent exchange  No Follow-up on file.  [name], MD  [facility] [address], [place], [place] [address] [phone]

## 2016-11-27 NOTE — Progress Notes (Signed)
Tension on cath   Pulled tight for bleeding

## 2016-11-27 NOTE — Transfer of Care (Signed)
Immediate Anesthesia Transfer of Care Note  Patient: Richard Duncan  Procedure(s) Performed: URETEROSCOPY WITH HOLMIUM LASER LITHOTRIPSY (Left ) CYSTOSCOPY WITH STENT REMOVAL/EXCHANGE (Bilateral )  Patient Location: PACU  Anesthesia Type:General  Level of Consciousness: awake, alert  and oriented  Airway & Oxygen Therapy: Patient Spontanous Breathing and Patient connected to face mask oxygen  Post-op Assessment: Report given to RN and Post -op Vital signs reviewed and stable  Post vital signs: Reviewed and stable  Last Vitals:  Vitals:   11/27/16 0945  BP: (!) 148/87  Pulse: 91  Resp: 20  Temp: 36.7 C  SpO2: 97%    Last Pain:  Vitals:   11/27/16 0945  TempSrc: Oral  PainSc: 8       Patients Stated Pain Goal: 4 (75/17/00 1749)  Complications: No apparent anesthesia complications

## 2016-11-27 NOTE — Progress Notes (Signed)
Small amt of bleeding noted around penis   Clots in cath

## 2016-11-27 NOTE — Anesthesia Preprocedure Evaluation (Signed)
Anesthesia Evaluation  Patient identified by MRN, date of birth, ID band Patient awake    Reviewed: Allergy & Precautions, H&P , NPO status , Patient's Chart, lab work & pertinent test results, reviewed documented beta blocker date and time   History of Anesthesia Complications (+) DIFFICULT AIRWAY and history of anesthetic complications  Airway Mallampati: IV  TM Distance: >3 FB Neck ROM: full    Dental  (+) Teeth Intact   Pulmonary neg pulmonary ROS, shortness of breath, sleep apnea and Continuous Positive Airway Pressure Ventilation , former smoker,    Pulmonary exam normal        Cardiovascular Exercise Tolerance: Poor hypertension, On Medications negative cardio ROS Normal cardiovascular exam+ dysrhythmias + pacemaker  Rhythm:regular Rate:Normal     Neuro/Psych  Headaches, PSYCHIATRIC DISORDERS  Neuromuscular disease negative neurological ROS  negative psych ROS   GI/Hepatic negative GI ROS, Neg liver ROS, hiatal hernia, GERD  Medicated,  Endo/Other  negative endocrine ROS  Renal/GU Renal diseasenegative Renal ROS  negative genitourinary   Musculoskeletal   Abdominal   Peds  Hematology negative hematology ROS (+) anemia ,   Anesthesia Other Findings Past Medical History: No date: Anemia No date: Anxiety 11/08/2013: Arthropathia No date: Bilateral carpal tunnel syndrome 2011: Cancer ([place])     Comment:  Prostate Ca No date: Carpal tunnel syndrome No date: Chronic kidney disease No date: Complication of anesthesia     Comment:  urinary retention with Popanol No date: Depression No date: Difficult intubation No date: Dyspnea     Comment:  RECENT-SUPPOSED TO HAVE STRESS TEST 09-26-16 WITH DR Ubaldo Glassing              BUT CANCELLED DUE TO STENT PLACEMENT ON 09-27-16 No date: ED (erectile dysfunction) No date: GERD (gastroesophageal reflux disease)     Comment:  rare No date: Hernia, hiatal No date: History of  blood product transfusion No date: History of hiatal hernia No date: History of kidney stones No date: Hyperlipemia No date: Hypertension No date: Neuropathy No date: Neuropathy No date: Obesity No date: Over weight No date: Presence of permanent cardiac pacemaker     Comment:  Medtronic No date: Prostatitis No date: PTSD (post-traumatic stress disorder) No date: Sleep apnea     Comment:  OSA--use Bi-PAP No date: Urinary retention Past Surgical History: No date: APPENDECTOMY 1988, 2006: BACK SURGERY     Comment:  Lumbar Spine No date: BACK SURGERY     Comment:  x2 Laminectomy No date: CARDIAC PACEMAKER PLACEMENT No date: CERVICAL FUSION 2008: CERVICAL SPINE SURGERY; N/A No date: COLONOSCOPY 11/01/2016: CYSTOSCOPY W/ URETERAL STENT PLACEMENT; Bilateral     Comment:  Procedure: CYSTOSCOPY WITH STENT REPLACEMENT;  Surgeon:               [name], MD;  Location: [facility];  Service:               Urology;  Laterality: Bilateral; 09/27/2016: CYSTOSCOPY WITH STENT PLACEMENT; Bilateral     Comment:  Procedure: [place];  Surgeon:               [name], MD;  Location: [facility];  Service:               Urology;  Laterality: Bilateral; 03/08/2016: EXCISION OF ABDOMINAL WALL TUMOR; N/A     Comment:  Procedure: EXCISION OF ABDOMINAL WALL MASS;  Surgeon:  Leonie Green, MD;  Location: [facility];  Service:               General;  Laterality: N/A; 2004, 2005, 2014, 2015: JOINT REPLACEMENT     Comment:  Bilat Hip Replacements 07/08/2014: SPINAL CORD STIMULATOR INSERTION; N/A     Comment:  Procedure: LUMBAR SPINAL CORD STIMULATOR INSERTION;                Surgeon: Clydell Hakim, MD;  Location: [place];  Service:               Neurosurgery;  Laterality: N/A;  LUMBAR SPINAL CORD               STIMULATOR INSERTION No date: TONSILLECTOMY 11/01/2016: URETEROSCOPY WITH HOLMIUM LASER LITHOTRIPSY; Bilateral     Comment:  Procedure:  URETEROSCOPY WITH HOLMIUM LASER LITHOTRIPSY;               Surgeon: [name], MD;  Location: [facility];                Service: Urology;  Laterality: Bilateral; BMI    Body Mass Index:  48.16 kg/m     Reproductive/Obstetrics negative OB ROS                             Anesthesia Physical Anesthesia Plan  ASA: III  Anesthesia Plan: General ETT   Post-op Pain Management:    Induction:   PONV Risk Score and Plan: 4 or greater and Ondansetron, Dexamethasone, Midazolam and Propofol infusion  Airway Management Planned:   Additional Equipment:   Intra-op Plan:   Post-operative Plan:   Informed Consent: I have reviewed the patients History and Physical, chart, labs and discussed the procedure including the risks, benefits and alternatives for the proposed anesthesia with the patient or authorized representative who has indicated his/her understanding and acceptance.   Dental Advisory Given  Plan Discussed with: CRNA  Anesthesia Plan Comments:         Anesthesia Quick Evaluation

## 2016-11-27 NOTE — Anesthesia Procedure Notes (Signed)
Procedure Name: Intubation Date/Time: 11/27/2016 11:39 AM Performed by: Johnna Acosta Pre-anesthesia Checklist: Patient identified, Emergency Drugs available, Suction available, Patient being monitored and Timeout performed Patient Re-evaluated:Patient Re-evaluated prior to induction Oxygen Delivery Method: Circle system utilized Preoxygenation: Pre-oxygenation with 100% oxygen Induction Type: IV induction, Rapid sequence and Cricoid Pressure applied Ventilation: Mask ventilation with difficulty, Two handed mask ventilation required and Oral airway inserted - appropriate to patient size Laryngoscope Size: McGraph and 4 Grade View: Grade II Tube type: Oral Tube size: 8.0 mm Number of attempts: 1 Airway Equipment and Method: Stylet and Video-laryngoscopy Placement Confirmation: ETT inserted through vocal cords under direct vision,  positive ETCO2 and breath sounds checked- equal and bilateral Secured at: 24 cm Tube secured with: Tape Dental Injury: Teeth and Oropharynx as per pre-operative assessment  Difficulty Due To: Difficulty was anticipated, Difficult Airway- due to large tongue, Difficult Airway- due to reduced neck mobility and Difficult Airway- due to limited oral opening

## 2016-11-27 NOTE — Anesthesia Postprocedure Evaluation (Signed)
Anesthesia Post Note  Patient: Richard Duncan  Procedure(s) Performed: URETEROSCOPY WITH HOLMIUM LASER LITHOTRIPSY (Left ) CYSTOSCOPY WITH STENT REMOVAL/EXCHANGE (Bilateral )  Patient location during evaluation: PACU Anesthesia Type: General Level of consciousness: awake Pain management: pain level controlled Vital Signs Assessment: post-procedure vital signs reviewed and stable Respiratory status: spontaneous breathing Cardiovascular status: stable Anesthetic complications: no     Last Vitals:  Vitals:   11/27/16 0945 11/27/16 1337  BP: (!) 148/87 137/71  Pulse: 91 (!) 102  Resp: 20 (!) 30  Temp: 36.7 C (!) 36.1 C  SpO2: 97% 99%    Last Pain:  Vitals:   11/27/16 0945  TempSrc: Oral  PainSc: 8                  VAN STAVEREN,Mehdi Gironda

## 2016-11-29 ENCOUNTER — Other Ambulatory Visit: Payer: Self-pay

## 2016-11-29 ENCOUNTER — Telehealth: Payer: Self-pay

## 2016-11-29 DIAGNOSIS — Z01818 Encounter for other preprocedural examination: Secondary | ICD-10-CM

## 2016-11-29 NOTE — Telephone Encounter (Signed)
-----   Message from [name], MD sent at 11/27/2016  1:37 PM EDT ----- Patient will need repeat cystoscopy, left ureteroscopy, laser lithotripsy, left ureteral stent exchange in 2 weeks as his stone burden was too significant to address during today's procedure.  He will need a urine culture prior to this procedure.

## 2016-11-29 NOTE — Telephone Encounter (Signed)
Patient notified that surgery per Dr. Pilar Jarvis is scheduled for 12-10-16 a reminder with pre-op info will be mailed to patient. Patient was told to come for a UA and CX prior to surgery on 12-03-16. Patient verbalized understanding and is in agreement with this plan

## 2016-12-02 ENCOUNTER — Other Ambulatory Visit: Payer: Medicare Other

## 2016-12-02 DIAGNOSIS — Z01818 Encounter for other preprocedural examination: Secondary | ICD-10-CM

## 2016-12-02 LAB — URINALYSIS, COMPLETE
Bilirubin, UA: NEGATIVE
Glucose, UA: NEGATIVE
Ketones, UA: NEGATIVE
Nitrite, UA: NEGATIVE
Specific Gravity, UA: 1.015 (ref 1.005–1.030)
Urobilinogen, Ur: 0.2 mg/dL (ref 0.2–1.0)
pH, UA: 6.5 (ref 5.0–7.5)

## 2016-12-02 LAB — MICROSCOPIC EXAMINATION
Bacteria, UA: NONE SEEN
Epithelial Cells (non renal): NONE SEEN /hpf (ref 0–10)

## 2016-12-03 ENCOUNTER — Other Ambulatory Visit: Payer: Self-pay

## 2016-12-05 LAB — CULTURE, URINE COMPREHENSIVE

## 2016-12-09 LAB — NM MYOCAR MULTI W/SPECT W/WALL MOTION / EF
Estimated workload: 1 METS
Exercise duration (min): 1 min
Exercise duration (sec): 0 s
LV dias vol: 148 mL (ref 62–150)
LV sys vol: 54 mL
MPHR: 156 {beats}/min
Peak HR: 95 {beats}/min
Percent HR: 60 %
Rest HR: 75 {beats}/min

## 2016-12-10 ENCOUNTER — Ambulatory Visit: Payer: Medicare Other | Admitting: Certified Registered Nurse Anesthetist

## 2016-12-10 ENCOUNTER — Ambulatory Visit
Admission: RE | Admit: 2016-12-10 | Discharge: 2016-12-10 | Disposition: A | Discharge status: Home / Self Care | Payer: Medicare Other | Source: Ambulatory Visit | Attending: Urology | Admitting: Urology

## 2016-12-10 ENCOUNTER — Encounter
Admission: RE | Disposition: A | Discharge status: Home / Self Care | Payer: Self-pay | Source: Ambulatory Visit | Attending: Urology

## 2016-12-10 ENCOUNTER — Encounter: Payer: Self-pay | Admitting: *Deleted

## 2016-12-10 DIAGNOSIS — I129 Hypertensive chronic kidney disease with stage 1 through stage 4 chronic kidney disease, or unspecified chronic kidney disease: Secondary | ICD-10-CM | POA: Diagnosis not present

## 2016-12-10 DIAGNOSIS — Z87891 Personal history of nicotine dependence: Secondary | ICD-10-CM | POA: Insufficient documentation

## 2016-12-10 DIAGNOSIS — N400 Enlarged prostate without lower urinary tract symptoms: Secondary | ICD-10-CM | POA: Insufficient documentation

## 2016-12-10 DIAGNOSIS — Z7951 Long term (current) use of inhaled steroids: Secondary | ICD-10-CM | POA: Diagnosis not present

## 2016-12-10 DIAGNOSIS — F329 Major depressive disorder, single episode, unspecified: Secondary | ICD-10-CM | POA: Diagnosis not present

## 2016-12-10 DIAGNOSIS — Z79899 Other long term (current) drug therapy: Secondary | ICD-10-CM | POA: Diagnosis not present

## 2016-12-10 DIAGNOSIS — Z96643 Presence of artificial hip joint, bilateral: Secondary | ICD-10-CM | POA: Insufficient documentation

## 2016-12-10 DIAGNOSIS — G4733 Obstructive sleep apnea (adult) (pediatric): Secondary | ICD-10-CM | POA: Insufficient documentation

## 2016-12-10 DIAGNOSIS — Z950 Presence of cardiac pacemaker: Secondary | ICD-10-CM | POA: Diagnosis not present

## 2016-12-10 DIAGNOSIS — Z6841 Body Mass Index (BMI) 40.0 and over, adult: Secondary | ICD-10-CM | POA: Diagnosis not present

## 2016-12-10 DIAGNOSIS — K219 Gastro-esophageal reflux disease without esophagitis: Secondary | ICD-10-CM | POA: Diagnosis not present

## 2016-12-10 DIAGNOSIS — N200 Calculus of kidney: Secondary | ICD-10-CM | POA: Diagnosis not present

## 2016-12-10 DIAGNOSIS — N189 Chronic kidney disease, unspecified: Secondary | ICD-10-CM | POA: Insufficient documentation

## 2016-12-10 DIAGNOSIS — Z9989 Dependence on other enabling machines and devices: Secondary | ICD-10-CM | POA: Diagnosis not present

## 2016-12-10 HISTORY — PX: URETEROSCOPY WITH HOLMIUM LASER LITHOTRIPSY: SHX6645

## 2016-12-10 HISTORY — PX: CYSTOSCOPY W/ URETERAL STENT PLACEMENT: SHX1429

## 2016-12-10 SURGERY — URETEROSCOPY, WITH LITHOTRIPSY USING HOLMIUM LASER
Anesthesia: General | Site: Ureter | Laterality: Left | Wound class: Clean Contaminated

## 2016-12-10 MED ORDER — TAMSULOSIN HCL 0.4 MG PO CAPS: 0.4000 mg | ORAL_CAPSULE | Freq: Every day | ORAL | 0 refills | Status: DC

## 2016-12-10 MED ORDER — CEFAZOLIN SODIUM-DEXTROSE 2-4 GM/100ML-% IV SOLN
INTRAVENOUS | Status: AC
Start: 2016-12-10 — End: 2016-12-10
  Filled 2016-12-10: qty 100

## 2016-12-10 MED ORDER — ONDANSETRON HCL 4 MG/2ML IJ SOLN
INTRAMUSCULAR | Status: AC
  Filled 2016-12-10: qty 2

## 2016-12-10 MED ORDER — IOTHALAMATE MEGLUMINE 43 % IV SOLN
INTRAVENOUS | Status: DC | PRN
  Administered 2016-12-10: 15 mL via URETHRAL

## 2016-12-10 MED ORDER — OXYCODONE-ACETAMINOPHEN 5-325 MG PO TABS: 1.0000 | ORAL_TABLET | ORAL | 0 refills | Status: AC | PRN

## 2016-12-10 MED ORDER — ONDANSETRON HCL 4 MG/2ML IJ SOLN: 4.0000 mg | Freq: Once | INTRAMUSCULAR | Status: DC | PRN

## 2016-12-10 MED ORDER — DEXAMETHASONE SODIUM PHOSPHATE 10 MG/ML IJ SOLN
INTRAMUSCULAR | Status: AC
  Filled 2016-12-10: qty 1

## 2016-12-10 MED ORDER — ROCURONIUM BROMIDE 50 MG/5ML IV SOLN
INTRAVENOUS | Status: AC
  Filled 2016-12-10: qty 1

## 2016-12-10 MED ORDER — SUGAMMADEX SODIUM 500 MG/5ML IV SOLN
INTRAVENOUS | Status: DC | PRN
  Administered 2016-12-10: 331.2 mg via INTRAVENOUS

## 2016-12-10 MED ORDER — ROCURONIUM BROMIDE 100 MG/10ML IV SOLN
INTRAVENOUS | Status: DC | PRN
  Administered 2016-12-10 (×2): 40 mg via INTRAVENOUS
  Administered 2016-12-10 (×2): 10 mg via INTRAVENOUS

## 2016-12-10 MED ORDER — LIDOCAINE HCL (PF) 2 % IJ SOLN
INTRAMUSCULAR | Status: AC
  Filled 2016-12-10: qty 10

## 2016-12-10 MED ORDER — FENTANYL CITRATE (PF) 100 MCG/2ML IJ SOLN
INTRAMUSCULAR | Status: DC | PRN
  Administered 2016-12-10: 100 ug via INTRAVENOUS

## 2016-12-10 MED ORDER — ONDANSETRON HCL 4 MG/2ML IJ SOLN
INTRAMUSCULAR | Status: DC | PRN
  Administered 2016-12-10: 4 mg via INTRAVENOUS

## 2016-12-10 MED ORDER — ATROPINE ORAL SOLUTION 0.08 MG/ML: 0.0100 mg/kg | Freq: Once | ORAL | Status: DC | PRN

## 2016-12-10 MED ORDER — ETOMIDATE 2 MG/ML IV SOLN
INTRAVENOUS | Status: DC | PRN
  Administered 2016-12-10: 40 mg via INTRAVENOUS

## 2016-12-10 MED ORDER — FAMOTIDINE 20 MG PO TABS
20.0000 mg | ORAL_TABLET | Freq: Once | ORAL | Status: AC
  Administered 2016-12-10: 20 mg via ORAL

## 2016-12-10 MED ORDER — GLYCOPYRROLATE 0.2 MG/ML IJ SOLN
INTRAMUSCULAR | Status: DC | PRN
  Administered 2016-12-10: 0.2 mg via INTRAVENOUS

## 2016-12-10 MED ORDER — FENTANYL CITRATE (PF) 100 MCG/2ML IJ SOLN: 25.0000 ug | INTRAMUSCULAR | Status: DC | PRN

## 2016-12-10 MED ORDER — PHENYLEPHRINE HCL 10 MG/ML IJ SOLN
INTRAMUSCULAR | Status: DC | PRN
  Administered 2016-12-10 (×5): 100 ug via INTRAVENOUS

## 2016-12-10 MED ORDER — FENTANYL CITRATE (PF) 100 MCG/2ML IJ SOLN
INTRAMUSCULAR | Status: AC
  Filled 2016-12-10: qty 2

## 2016-12-10 MED ORDER — SUCCINYLCHOLINE CHLORIDE 20 MG/ML IJ SOLN
INTRAMUSCULAR | Status: DC | PRN
  Administered 2016-12-10: 200 mg via INTRAVENOUS

## 2016-12-10 MED ORDER — MIDAZOLAM HCL 2 MG/2ML IJ SOLN
INTRAMUSCULAR | Status: DC | PRN
  Administered 2016-12-10: 2 mg via INTRAVENOUS

## 2016-12-10 MED ORDER — LACTATED RINGERS IV SOLN
INTRAVENOUS | Status: DC
  Administered 2016-12-10 (×3): via INTRAVENOUS

## 2016-12-10 MED ORDER — SUCCINYLCHOLINE CHLORIDE 20 MG/ML IJ SOLN
INTRAMUSCULAR | Status: AC
  Filled 2016-12-10: qty 1

## 2016-12-10 MED ORDER — PROPOFOL 10 MG/ML IV BOLUS
INTRAVENOUS | Status: AC
  Filled 2016-12-10: qty 20

## 2016-12-10 MED ORDER — MIDAZOLAM HCL 2 MG/2ML IJ SOLN
INTRAMUSCULAR | Status: AC
  Filled 2016-12-10: qty 2

## 2016-12-10 MED ORDER — SUGAMMADEX SODIUM 500 MG/5ML IV SOLN
INTRAVENOUS | Status: AC
  Filled 2016-12-10: qty 5

## 2016-12-10 MED ORDER — LIDOCAINE HCL (CARDIAC) 20 MG/ML IV SOLN
INTRAVENOUS | Status: DC | PRN
  Administered 2016-12-10: 100 mg via INTRAVENOUS

## 2016-12-10 MED ORDER — DEXAMETHASONE SODIUM PHOSPHATE 10 MG/ML IJ SOLN
INTRAMUSCULAR | Status: DC | PRN
  Administered 2016-12-10: 10 mg via INTRAVENOUS

## 2016-12-10 MED ORDER — CEFAZOLIN SODIUM-DEXTROSE 2-4 GM/100ML-% IV SOLN
2.0000 g | Freq: Once | INTRAVENOUS | Status: AC
  Administered 2016-12-10: 2 g via INTRAVENOUS

## 2016-12-10 MED ORDER — FAMOTIDINE 20 MG PO TABS
ORAL_TABLET | ORAL | Status: AC
  Administered 2016-12-10: 20 mg via ORAL
  Filled 2016-12-10: qty 1

## 2016-12-10 SURGICAL SUPPLY — 34 items
BAG DRAIN CYSTO-URO LG1000N (MISCELLANEOUS) ×3 IMPLANT
BASKET ZERO TIP 1.9FR (BASKET) ×3 IMPLANT
CATH URETL 5X70 OPEN END (CATHETERS) ×3 IMPLANT
CNTNR SPEC 2.5X3XGRAD LEK (MISCELLANEOUS)
CONRAY 43 FOR UROLOGY 50M (MISCELLANEOUS) ×3 IMPLANT
CONT SPEC 4OZ STER OR WHT (MISCELLANEOUS)
CONTAINER SPEC 2.5X3XGRAD LEK (MISCELLANEOUS) IMPLANT
DRAPE UTILITY 15X26 TOWEL STRL (DRAPES) ×3 IMPLANT
FIBER LASER LITHO 273 (Laser) ×3 IMPLANT
GLIDEWIRE STR 0.035 150CM 3CM (WIRE) ×3 IMPLANT
GLOVE BIOGEL M 8.0 STRL (GLOVE) ×3 IMPLANT
GLOVE BIOGEL PI IND STRL 8 (GLOVE) IMPLANT
GLOVE BIOGEL PI INDICATOR 8 (GLOVE)
GOWN STANDARD XL  REUSABL (MISCELLANEOUS) ×3 IMPLANT
GUIDEWIRE GREEN .038 145CM (MISCELLANEOUS) IMPLANT
INFUSOR MANOMETER BAG 3000ML (MISCELLANEOUS) ×3 IMPLANT
INTRODUCER DILATOR DOUBLE (INTRODUCER) IMPLANT
KIT RM TURNOVER CYSTO AR (KITS) ×3 IMPLANT
PACK CYSTO AR (MISCELLANEOUS) ×3 IMPLANT
SCRUB POVIDONE IODINE 4 OZ (MISCELLANEOUS) IMPLANT
SENSORWIRE 0.038 NOT ANGLED (WIRE) ×3
SET CYSTO W/LG BORE CLAMP LF (SET/KITS/TRAYS/PACK) ×3 IMPLANT
SHEATH URETERAL 12FR 45CM (SHEATH) ×3 IMPLANT
SHEATH URETERAL 12FRX35CM (MISCELLANEOUS) IMPLANT
SHEATH URETERAL 13/15X36 1L (SHEATH) IMPLANT
SHEATH URETL 1L 13/15X28 (SHEATH) IMPLANT
SOL .9 NS 3000ML IRR  AL (IV SOLUTION) ×2
SOL .9 NS 3000ML IRR UROMATIC (IV SOLUTION) ×1 IMPLANT
STENT URET 6FRX24 CONTOUR (STENTS) IMPLANT
STENT URET 6FRX26 CONTOUR (STENTS) ×3 IMPLANT
SURGILUBE 2OZ TUBE FLIPTOP (MISCELLANEOUS) ×3 IMPLANT
SYRINGE IRR TOOMEY STRL 70CC (SYRINGE) IMPLANT
WATER STERILE IRR 1000ML POUR (IV SOLUTION) ×3 IMPLANT
WIRE SENSOR 0.038 NOT ANGLED (WIRE) ×1 IMPLANT

## 2016-12-10 NOTE — Anesthesia Preprocedure Evaluation (Addendum)
Anesthesia Evaluation   Patient awake    Reviewed: Allergy & Precautions, NPO status , Patient's Chart, lab work & pertinent test results  History of Anesthesia Complications (+) DIFFICULT AIRWAY and history of anesthetic complications  Airway Mallampati: III       Dental   Pulmonary sleep apnea and Continuous Positive Airway Pressure Ventilation , former smoker,           Cardiovascular hypertension, Pt. on medications + dysrhythmias (symptomatic bradycardia) + pacemaker      Neuro/Psych Anxiety Depression    GI/Hepatic Neg liver ROS, hiatal hernia, GERD  Medicated and Controlled,  Endo/Other  neg diabetesMorbid obesity  Renal/GU Renal InsufficiencyRenal disease     Musculoskeletal   Abdominal   Peds  Hematology  (+) anemia ,   Anesthesia Other Findings   Reproductive/Obstetrics                            Anesthesia Physical Anesthesia Plan  ASA: III  Anesthesia Plan: General   Post-op Pain Management:    Induction: Intravenous  PONV Risk Score and Plan: Treatment may vary due to age or medical condition  Airway Management Planned: Oral ETT  Additional Equipment:   Intra-op Plan:   Post-operative Plan:   Informed Consent: I have reviewed the patients History and Physical, chart, labs and discussed the procedure including the risks, benefits and alternatives for the proposed anesthesia with the patient or authorized representative who has indicated his/her understanding and acceptance.     Plan Discussed with:   Anesthesia Plan Comments:         Anesthesia Quick Evaluation

## 2016-12-10 NOTE — H&P (Signed)
8:38 AM   Richard Duncan 01/02/1951 [id number]  HPI: 66 year old male who underwent left ureteroscopic stone removal and ureteroscopy and laser lithotripsy of renal calculi on 11/27/2016.  His large left renal calculi were unable to be fragmented completely secondary to the length of procedure and suboptimal visualization.  He presents for second look left ureteroscopy with laser lithotripsy/calculus removal.  Preoperative urine culture was negative.   PMH: Past Medical History:  Diagnosis Date  . Anemia   . Anxiety   . 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 Bilateral carpal tunnel syndrome   . Cancer [facility]) 2011   Prostate Ca  . Carpal tunnel syndrome   . Chronic kidney disease   . Complication of anesthesia    urinary retention with Popanol  . Depression   . Difficult intubation   . Dyspnea    RECENT-SUPPOSED TO HAVE STRESS TEST 09-26-16 WITH DR Ubaldo Glassing BUT CANCELLED DUE TO STENT PLACEMENT ON 09-27-16  . ED (erectile dysfunction)   . GERD (gastroesophageal reflux disease)    rare  . Hernia, hiatal   . History of blood product transfusion   . History of hiatal hernia   . History of kidney stones   . Hyperlipemia   . Hypertension   . Neuropathy   . Neuropathy   . Obesity   . Over weight   . Presence of permanent cardiac pacemaker    Medtronic  . Prostatitis   . PTSD (post-traumatic stress disorder)   . Sleep apnea    OSA--use Bi-PAP  . Urinary retention     Surgical History: Past Surgical History:  Procedure Laterality Date  . APPENDECTOMY    . [place], 2006   Lumbar Spine  . BACK SURGERY     x2 Laminectomy  . CARDIAC PACEMAKER PLACEMENT    . CERVICAL FUSION    . CERVICAL SPINE SURGERY N/A 2008  . COLONOSCOPY    . JOINT REPLACEMENT  2004, 2005, 2014, 2015   Bilat Hip Replacements  . TONSILLECTOMY      Allergies:  Allergies  Allergen Reactions  . Propofol Other (See Comments)    URINARY RETENTION REQUIRING CATHETERIZATION  . Propranolol Other (See  Comments)    Urinary retention Urinary retention    Family History: Family History  Problem Relation Age of Onset  . Benign prostatic hyperplasia Father   . Prostate cancer Father   . Hypertension Mother   . Kidney disease Neg Hx   . Kidney cancer Neg Hx   . Bladder Cancer Neg Hx     Social History:  reports that he quit smoking about 50 years ago. His smoking use included cigarettes. He quit after 2.00 years of use. he has never used smokeless tobacco. He reports that he drinks alcohol. He reports that he does not use drugs.   Physical Exam: BP (!) 157/79   Pulse 86   Temp 98.4 F (36.9 C) (Tympanic)   Resp 18   Ht 6\' 1"  (1.854 m)   Wt (!) 365 lb (165.6 kg)   SpO2 97%   BMI 48.16 kg/m   Constitutional:  Alert and oriented, No acute distress. HEENT: [place] AT, moist mucus membranes.  Trachea midline, no masses. Cardiovascular: No clubbing, cyanosis, or edema. RRR Respiratory: Normal respiratory effort, no increased work of breathing.  Lungs clear GI: Abdomen is soft, nontender, nondistended, no abdominal masses GU: No CVA tenderness.  Skin: No rashes, bruises or suspicious lesions. Lymph: No cervical or inguinal adenopathy. Neurologic: Grossly  intact, no focal deficits, moving all 4 extremities. Psychiatric: Normal mood and affect.  Laboratory Data: Lab Results  Component Value Date   WBC 13.4 (H) 09/22/2016   HGB 17.1 09/22/2016   HCT 50.0 09/22/2016   MCV 92.9 09/22/2016   PLT 188 09/22/2016    Lab Results  Component Value Date   CREATININE 1.20 09/22/2016    Lab Results  Component Value Date   PSA1 2.6 11/13/2015   PSA1 2.7 05/16/2015   PSA1 2.0 01/06/2015    Urinalysis Lab Results  Component Value Date   SPECGRAV 1.015 12/02/2016   PHUR 6.5 12/02/2016   COLORU Yellow 12/02/2016   APPEARANCEUR Cloudy (A) 12/02/2016   LEUKOCYTESUR 2+ (A) 12/02/2016   PROTEINUR 1+ (A) 12/02/2016   GLUCOSEU Negative 12/02/2016   KETONESU Negative 12/02/2016   RBCU  3+ (A) 12/02/2016   BILIRUBINUR Negative 12/02/2016   UUROB 0.2 12/02/2016   NITRITE Negative 12/02/2016    Lab Results  Component Value Date   LABMICR See below: 12/02/2016   WBCUA 0-5 12/02/2016   RBCUA 11-30 (A) 12/02/2016   LABEPIT None seen 12/02/2016   MUCUS Present (A) 12/02/2016   BACTERIA None seen 12/02/2016    Pertinent Imaging:  Results for orders placed during the hospital encounter of 09/22/16  CT RENAL STONE STUDY   Narrative CLINICAL DATA:  Right-sided flank pain and hematuria for 2 days. Nephrolithiasis.  EXAM: CT ABDOMEN AND PELVIS WITHOUT CONTRAST  TECHNIQUE: Multidetector CT imaging of the abdomen and pelvis was performed following the standard protocol without IV contrast.  COMPARISON:  11/24/2014 and 09/24/2010  FINDINGS: Lower chest: No acute findings.  Hepatobiliary: No masses visualized on this unenhanced exam. Stable tiny sub-cm low-attenuation lesions in left hepatic lobe, most consistent with tiny cysts.  Pancreas: No mass or inflammatory process visualized on this unenhanced exam.  Spleen:  Within normal limits in size.  Adrenals/Urinary tract: Stable 1.8 cm low-attenuation left adrenal mass, consistent with benign adenoma.  Multiple small less than 1 cm renal calculi are seen bilaterally. Mild right hydronephrosis is seen due to 6 mm calculus at the right ureteropelvic junction.  A 10 mm calculus is seen in the left renal pelvis. 11 mm calculus is seen in the distal left ureter, without left hydroureteronephrosis. No bladder calculi identified.  Stomach/Bowel: No evidence of obstruction, inflammatory process, or abnormal fluid collections.  Vascular/Lymphatic: No pathologically enlarged lymph nodes identified. No evidence of abdominal aortic aneurysm.  Reproductive: Mild to moderate prostate enlargement, with median lobe hypertrophy indenting the bladder base.  Other: Significant beam hardening artifact through the  inferior pelvis from bilateral hip prostheses.  Musculoskeletal:  No suspicious bone lesions identified.  IMPRESSION: Mild right hydronephrosis due to 6 mm calculus at right ureteropelvic junction.  11 mm distal left ureteral calculus and 10 mm calculus in left renal pelvis. No left hydroureteronephrosis.  Bilateral intrarenal calculi.  Stable small benign left adrenal adenoma.  Enlarged prostate.   Electronically Signed   By: Earle Gell M.D.   On: 09/22/2016 08:32     Assessment & Plan:  He presents for follow-up second look left ureteroscopy.  The procedure has been discussed in detail including potential risks of bleeding, infection and inability to completely render her stone free.  He indicated all questions were answered and desires to proceed.    Abbie Sons, [place] [address], [place], [place] [address] [phone]

## 2016-12-10 NOTE — Anesthesia Procedure Notes (Signed)
Procedure Name: Intubation Date/Time: 12/10/2016 9:37 AM Performed by: Nelda Marseille, CRNA Pre-anesthesia Checklist: Patient identified, Patient being monitored, Timeout performed, Emergency Drugs available and Suction available Patient Re-evaluated:Patient Re-evaluated prior to induction Oxygen Delivery Method: Circle system utilized Preoxygenation: Pre-oxygenation with 100% oxygen Induction Type: IV induction Ventilation: Mask ventilation without difficulty Laryngoscope Size: Mac, 3 and McGraph Grade View: Grade IV Tube type: Oral Tube size: 7.5 mm Number of attempts: 1 Airway Equipment and Method: Stylet and Video-laryngoscopy Placement Confirmation: ETT inserted through vocal cords under direct vision,  positive ETCO2 and breath sounds checked- equal and bilateral Secured at: 21 cm Tube secured with: Tape Dental Injury: Teeth and Oropharynx as per pre-operative assessment  Difficulty Due To: Difficulty was anticipated, Difficult Airway- due to large tongue and Difficult Airway- due to reduced neck mobility

## 2016-12-10 NOTE — Interval H&P Note (Signed)
History and Physical Interval Note:  12/10/2016 9:07 AM  Richard Duncan  has presented today for surgery, with the diagnosis of left renal stone  The various methods of treatment have been discussed with the patient and family. After consideration of risks, benefits and other options for treatment, the patient has consented to  Procedure(s): URETEROSCOPY WITH HOLMIUM LASER LITHOTRIPSY (Left) CYSTOSCOPY WITH STENT REPLACEMENT (Left) as a surgical intervention .  The patient's history has been reviewed, patient examined, no change in status, stable for surgery.  I have reviewed the patient's chart and labs.  Questions were answered to the patient's satisfaction.     Branch

## 2016-12-10 NOTE — Anesthesia Post-op Follow-up Note (Signed)
Anesthesia QCDR form completed.        

## 2016-12-10 NOTE — Discharge Instructions (Signed)

## 2016-12-10 NOTE — Anesthesia Postprocedure Evaluation (Signed)
Anesthesia Post Note  Patient: Richard Duncan  Procedure(s) Performed: URETEROSCOPY WITH HOLMIUM LASER LITHOTRIPSY (Left Ureter) CYSTOSCOPY WITH STENT REPLACEMENT (Left Ureter)  Patient location during evaluation: PACU Anesthesia Type: General Level of consciousness: awake and alert Pain management: pain level controlled Vital Signs Assessment: post-procedure vital signs reviewed and stable Respiratory status: spontaneous breathing and respiratory function stable Cardiovascular status: stable Anesthetic complications: no     Last Vitals:  Vitals:   12/10/16 1135 12/10/16 1147  BP: 124/68 (!) 141/75  Pulse:  78  Resp:  16  Temp: (!) 36.2 C (!) 36.3 C  SpO2:  96%    Last Pain:  Vitals:   12/10/16 1147  TempSrc: Temporal  PainSc:                  Nasiya Pascual K

## 2016-12-10 NOTE — Transfer of Care (Signed)
Immediate Anesthesia Transfer of Care Note  Patient: Richard Duncan  Procedure(s) Performed: URETEROSCOPY WITH HOLMIUM LASER LITHOTRIPSY (Left Ureter) CYSTOSCOPY WITH STENT REPLACEMENT (Left Ureter)  Patient Location: PACU  Anesthesia Type:General  Level of Consciousness: awake, alert  and oriented  Airway & Oxygen Therapy: Patient Spontanous Breathing and Patient connected to face mask oxygen  Post-op Assessment: Report given to RN and Post -op Vital signs reviewed and stable  Post vital signs: Reviewed and stable  Last Vitals:  Vitals:   12/10/16 0731  BP: (!) 157/79  Pulse: 86  Resp: 18  Temp: 36.9 C  SpO2: 97%    Last Pain:  Vitals:   12/10/16 0731  TempSrc: Tympanic  PainSc: 8          Complications: No apparent anesthesia complications

## 2016-12-11 ENCOUNTER — Encounter: Payer: Self-pay | Admitting: Urology

## 2016-12-11 NOTE — Op Note (Signed)
Preoperative diagnosis: Left renal calculi   Postoperative diagnosis: Left renal calculi   Procedure:  1. Cystoscopy 2. Left ureteroscopy and stone removal 3. Ureteroscopic laser lithotripsy 4. Left ureteral stent placement  5. Left retrograde pyelography with interpretation  Surgeon: Nicki Reaper C. Tian Mcmurtrey,  M.D.  Anesthesia: General  Complications: None  Intraoperative findings: Left retrograde pyelography demonstrated filling defects within the left upper and middle calyces consistent with the patient's known calculi without other abnormalities noted.  EBL: Minimal  Specimens: None   Indication: Richard Duncan is a 66 year old male who underwent left ureteroscopic stone removal and ureteroscopy and laser lithotripsy of renal calculi on 11/27/2016.  His large left renal calculi were unable to be fragmented completely secondary to the length of procedure and suboptimal visualization.  He presents for second look left ureteroscopy with laser lithotripsy/calculus removal.  Preoperative urine culture was negative.   After reviewing the management options for treatment, they elected to proceed with the above surgical procedure(s). We have discussed the potential benefits and risks of the procedure, side effects of the proposed treatment, the likelihood of the patient achieving the goals of the procedure, and any potential problems that might occur during the procedure or recuperation. Informed consent has been obtained.  Description of procedure:  The patient was taken to the operating room and general anesthesia was induced.  The patient was placed in the dorsal lithotomy position, prepped and draped in the usual sterile fashion, and preoperative antibiotics were administered. A preoperative time-out was performed.   Cystourethroscopy was performed.  The patient's urethra was examined and was normal.  There was significant lateral lobe prostate enlargement with bladder neck elevation and  moderate median lobe.  The bladder was then systematically examined in its entirety. There was no evidence for any bladder tumors, stones, or other mucosal pathology.    Attention then turned to the left ureteral orifice and the left ureteral stent was visualized, grasped with endoscopic forceps and brought out to the urethral meatus.  A 0.038 Sensor wire was placed through the stent and easily passed up to the renal pelvis under fluoroscopic guidance.  The ureteral stent was removed.  A 5 French open-ended ureteral catheter was then placed over the wire and the wire was removed.  Omnipaque contrast was injected through the ureteral catheter and a retrograde pyelogram was performed with findings as dictated above.  The guidewire was then replaced and the ureteral catheter was removed.  A 12/14 Fr ureteral access sheath was then advanced over the guide wire and placed under fluoroscopic guidance without difficulty. The digital flexible ureteroscope was then advanced through the access sheath into the ureter and into the renal pelvis next to the guidewire.  There were a few small fragments and one larger fragment in an upper pole calyx.  In a lower pole calyx were multiple small fragments and 2 larger fragments estimated at approximately 5 mm.   The calculi were then fragmented with the 273 micron holmium laser fiber on a setting of 0.2 J and frequency of 40 hz.   All sizable stones were then removed with a zero tip nitinol basket.  Reinspection of the ureter/renal pelvis revealed no remaining visible stones or fragments of significant size.   The safety wire was then replaced and the access sheath removed.  The guidewire was backloaded through the cystoscope and a 6 French/26 cm ureteral stent was advance over the wire using Seldinger technique.  The stent was positioned appropriately under fluoroscopic and cystoscopic guidance.  The wire was then removed with an adequate stent curl noted in the renal  pelvis as well as in the bladder.  The bladder was then emptied and the procedure ended.  The patient appeared to tolerate the procedure well and without complications.  The patient was able to be awakened and transferred to the recovery unit in satisfactory condition.  The stent was left attached to a string and will be removed in 1 week.  Richard Duncan C. Bernardo Heater, MD

## 2017-01-16 ENCOUNTER — Encounter: Payer: Self-pay | Admitting: Urology

## 2017-01-20 DIAGNOSIS — M47816 Spondylosis without myelopathy or radiculopathy, lumbar region: Secondary | ICD-10-CM | POA: Insufficient documentation

## 2017-01-20 DIAGNOSIS — M542 Cervicalgia: Secondary | ICD-10-CM | POA: Insufficient documentation

## 2017-02-12 ENCOUNTER — Other Ambulatory Visit: Payer: Self-pay

## 2017-02-12 ENCOUNTER — Encounter: Payer: Self-pay | Admitting: Pain Medicine

## 2017-02-12 ENCOUNTER — Ambulatory Visit: Payer: Medicare Other | Attending: Pain Medicine | Admitting: Pain Medicine

## 2017-02-12 VITALS — BP 159/96 | HR 85 | Temp 97.8°F | Resp 16 | Ht 73.0 in | Wt 370.0 lb

## 2017-02-12 DIAGNOSIS — M542 Cervicalgia: Secondary | ICD-10-CM | POA: Diagnosis present

## 2017-02-12 DIAGNOSIS — E785 Hyperlipidemia, unspecified: Secondary | ICD-10-CM | POA: Insufficient documentation

## 2017-02-12 DIAGNOSIS — Z87442 Personal history of urinary calculi: Secondary | ICD-10-CM | POA: Diagnosis not present

## 2017-02-12 DIAGNOSIS — F4310 Post-traumatic stress disorder, unspecified: Secondary | ICD-10-CM | POA: Insufficient documentation

## 2017-02-12 DIAGNOSIS — K219 Gastro-esophageal reflux disease without esophagitis: Secondary | ICD-10-CM | POA: Diagnosis not present

## 2017-02-12 DIAGNOSIS — N189 Chronic kidney disease, unspecified: Secondary | ICD-10-CM | POA: Diagnosis not present

## 2017-02-12 DIAGNOSIS — C61 Malignant neoplasm of prostate: Secondary | ICD-10-CM | POA: Insufficient documentation

## 2017-02-12 DIAGNOSIS — G5603 Carpal tunnel syndrome, bilateral upper limbs: Secondary | ICD-10-CM | POA: Diagnosis not present

## 2017-02-12 DIAGNOSIS — Z981 Arthrodesis status: Secondary | ICD-10-CM | POA: Diagnosis not present

## 2017-02-12 DIAGNOSIS — M4804 Spinal stenosis, thoracic region: Secondary | ICD-10-CM | POA: Diagnosis not present

## 2017-02-12 DIAGNOSIS — M47812 Spondylosis without myelopathy or radiculopathy, cervical region: Secondary | ICD-10-CM | POA: Diagnosis not present

## 2017-02-12 DIAGNOSIS — G4486 Cervicogenic headache: Secondary | ICD-10-CM

## 2017-02-12 DIAGNOSIS — G8929 Other chronic pain: Secondary | ICD-10-CM | POA: Diagnosis not present

## 2017-02-12 DIAGNOSIS — Z79899 Other long term (current) drug therapy: Secondary | ICD-10-CM | POA: Diagnosis not present

## 2017-02-12 DIAGNOSIS — R51 Headache: Secondary | ICD-10-CM | POA: Insufficient documentation

## 2017-02-12 DIAGNOSIS — Z87891 Personal history of nicotine dependence: Secondary | ICD-10-CM | POA: Diagnosis not present

## 2017-02-12 DIAGNOSIS — M25551 Pain in right hip: Secondary | ICD-10-CM | POA: Insufficient documentation

## 2017-02-12 DIAGNOSIS — G4730 Sleep apnea, unspecified: Secondary | ICD-10-CM | POA: Insufficient documentation

## 2017-02-12 DIAGNOSIS — M5134 Other intervertebral disc degeneration, thoracic region: Secondary | ICD-10-CM | POA: Insufficient documentation

## 2017-02-12 DIAGNOSIS — M47814 Spondylosis without myelopathy or radiculopathy, thoracic region: Secondary | ICD-10-CM | POA: Insufficient documentation

## 2017-02-12 DIAGNOSIS — N529 Male erectile dysfunction, unspecified: Secondary | ICD-10-CM | POA: Insufficient documentation

## 2017-02-12 DIAGNOSIS — F329 Major depressive disorder, single episode, unspecified: Secondary | ICD-10-CM | POA: Diagnosis not present

## 2017-02-12 DIAGNOSIS — F419 Anxiety disorder, unspecified: Secondary | ICD-10-CM | POA: Diagnosis not present

## 2017-02-12 DIAGNOSIS — M4722 Other spondylosis with radiculopathy, cervical region: Secondary | ICD-10-CM | POA: Diagnosis not present

## 2017-02-12 DIAGNOSIS — M47816 Spondylosis without myelopathy or radiculopathy, lumbar region: Secondary | ICD-10-CM | POA: Insufficient documentation

## 2017-02-12 DIAGNOSIS — E669 Obesity, unspecified: Secondary | ICD-10-CM | POA: Diagnosis not present

## 2017-02-12 DIAGNOSIS — M25552 Pain in left hip: Secondary | ICD-10-CM | POA: Diagnosis not present

## 2017-02-12 DIAGNOSIS — M48061 Spinal stenosis, lumbar region without neurogenic claudication: Secondary | ICD-10-CM | POA: Diagnosis not present

## 2017-02-12 DIAGNOSIS — Z950 Presence of cardiac pacemaker: Secondary | ICD-10-CM | POA: Insufficient documentation

## 2017-02-12 DIAGNOSIS — G894 Chronic pain syndrome: Secondary | ICD-10-CM | POA: Insufficient documentation

## 2017-02-12 DIAGNOSIS — Z96643 Presence of artificial hip joint, bilateral: Secondary | ICD-10-CM | POA: Insufficient documentation

## 2017-02-12 DIAGNOSIS — I129 Hypertensive chronic kidney disease with stage 1 through stage 4 chronic kidney disease, or unspecified chronic kidney disease: Secondary | ICD-10-CM | POA: Insufficient documentation

## 2017-02-12 DIAGNOSIS — M545 Low back pain: Secondary | ICD-10-CM | POA: Diagnosis present

## 2017-02-12 NOTE — Progress Notes (Signed)
Patient's Name: Richard Duncan  MRN: [id number]  Referring Provider: Sofie Hartigan, MD  DOB: 03-02-1950  PCP: Sofie Hartigan, MD  DOS: 02/12/2017  Note by: Gaspar Cola, MD  Service setting: Ambulatory outpatient  Specialty: Interventional Pain Management  Location: [facility] (AMB) Pain Management Facility    Patient type: Established   Primary Reason(s) for Visit: Encounter for post-procedure evaluation of chronic illness with mild to moderate exacerbation CC: Neck Pain (base) and Back Pain (lower)  HPI  Richard Duncan is a 67 y.o. year old, male patient, who comes today for a post-procedure evaluation. 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 ([place]); Chronic pain syndrome; Chronic prescription benzodiazepine use; Lower extremity neuropathy (Bilateral); Chronic hip pain (Primary Source of Pain) (Bilateral) (R>L); History of bilateral total hip arthroplasty; Chronic low back pain (Secondary source of pain) (Bilateral) (R>L); Chronic neck pain (Tertiary source of pain) (Midline) (Bilateral); Disturbance of skin sensation; Lumbar facet arthropathy (Bilateral: L12, L2-3, L3-4, L4-5, L5-S1); Lumbar foraminal stenosis (Bilateral: L2-3, L3-4, and L4-5); Thoracic foraminal stenosis (Right: T8-9 and T9-10); Lumbar spondylosis; Osteoarthritis; Thoracic spondylosis; [employer] spinal cord stimulator; Elevated C-reactive protein (CRP); Neuropathy; Chronic shoulder pain (Bilateral) (L>R); Cervicogenic headache (Bilateral) (L>R); Lumbar lateral recess stenosis (Bilateral: L2-3, L3-4, and L4-5); Lumbar facet syndrome (Bilateral) (R>L); 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 His primarily concern today is the Neck Pain (base) and Back Pain (lower)  Pain Assessment: Location:   Neck Radiating: shoulders bilateral,  lower back to hips bilaterally , knees to feet Onset: More than a month ago Duration: Chronic pain Quality: Aching, Constant, Discomfort, Sharp, Numbness(tooth ache) Severity: 8 /10 (self-reported pain score)  Note: Reported level is inconsistent with clinical observations. Clinically the patient looks like a 66/10 A 3/10 is viewed as "Moderate" and described as significantly interfering with activities of daily living (ADL). It becomes difficult to feed, bathe, get dressed, get on and off the toilet or to perform personal hygiene functions. Difficult to get in and out of bed or a chair without assistance. Very distracting. With effort, it can be ignored when deeply involved in activities. Information on the proper use of the pain scale provided to the patient today. When using our objective Pain Scale, levels between 6 and 10/10 are said to belong in an emergency room, as it progressively worsens from a 6/10, described as severely limiting, requiring emergency care not usually available at an outpatient pain management facility. At a 6/10 level, communication becomes difficult and requires great effort. Assistance to reach the emergency department may be required. Facial flushing and profuse sweating along  with potentially dangerous increases in heart rate and blood pressure will be evident. Effect on ADL: prolonged walking, showering, cant  work, cant stand long periods of time Timing: Constant Modifying factors: "Nothing"  Richard Duncan comes in today for post-procedure evaluation after the treatment done on  09/02/2016.  Further details on both, my assessment(s), as well as the proposed treatment plan, please see below.  Post-Procedure Assessment  09/02/2016 Procedure: Diagnostic bilateral cervical facet block #1 under fluoroscopic guidance and IV sedation  Pre-procedure pain score:  8/10 Post-procedure pain score: 0/10 (100% relief) Influential Factors: BMI: 48.82 kg/m Intra-procedural challenges: None observed.         Assessment challenges: Results reported today are inconsistent with those reported on procedure day.        Before discharge, the patient had previously reported 100% relief of the pain. Reported side-effects: None.        Post-procedural adverse reactions or complications: None reported         Sedation: Sedation provided. When no sedatives are used, the analgesic levels obtained are directly associated to the effectiveness of the local anesthetics. However, when sedation is provided, the level of analgesia obtained during the initial 1 hour following the intervention, is believed to be the result of a combination of factors. These factors may include, but are not limited to: 1. The effectiveness of the local anesthetics used. 2. The effects of the analgesic(s) and/or anxiolytic(s) used. 3. The degree of discomfort experienced by the patient at the time of the procedure. 4. The patients ability and reliability in recalling and recording the events. 5. The presence and influence of possible secondary gains and/or psychosocial factors. Reported result: Relief experienced during the 1st hour after the procedure: 80 % (Ultra-Short Term Relief)            Interpretative annotation:  Clinically appropriate result. Analgesia during this period is likely to be Local Anesthetic and/or IV Sedative (Analgesic/Anxiolytic) related.          Effects of local anesthetic: The analgesic effects attained during this period are directly associated to the localized infiltration of local anesthetics and therefore cary significant diagnostic value as to the etiological location, or anatomical origin, of the pain. Expected duration of relief is directly dependent on the pharmacodynamics of the local anesthetic used. Long-acting (4-6 hours) anesthetics used.  Reported result: Relief during the next 4 to 6 hour after the procedure: 80 % (Short-Term Relief)            Interpretative annotation: Clinically appropriate result. Analgesia during this period is likely to be Local Anesthetic-related.     Long-term benefit: Defined as the period of time past the expected duration of local anesthetics (1 hour for short-acting and 4-6 hours for long-acting). With the possible exception of prolonged sympathetic blockade from the local anesthetics, benefits during this period are typically attributed to, or associated with, other factors such as analgesic sensory neuropraxia, antiinflammatory effects, or beneficial biochemical changes provided by agents other than the local anesthetics.  Reported result: Extended relief following procedure: 0 % (Long-Term Relief)            Interpretative annotation: Clinically possible results. No benefit. Therapeutic failure. No significant inflammatory component detected. Etiology is likely mechanical rather than inflammatory.  Current benefits: Defined as reported results that persistent at this point in time.   Analgesia: 0 %            Function: No benefit ROM: No benefit Interpretative annotation: No benefit. Therapeutic failure. Effective diagnostic intervention.          Interpretation: Results would suggest a successful diagnostic intervention.                   Plan:  Re-assessment of pain etiology.  Laboratory Chemistry  Inflammation Markers (CRP: Acute Phase) (ESR: Chronic Phase) Lab Results  Component Value Date   CRP 1.1 (H) 03/11/2016   ESRSEDRATE 6 03/11/2016                 Rheumatology Markers Lab Results  Component Value Date   LABURIC 5 11/01/2016                Renal Function Markers Lab Results  Component Value Date   BUN 20 09/22/2016   CREATININE 1.20 09/22/2016   GFRAA >60 09/22/2016   GFRNONAA >60 09/22/2016                 Hepatic Function Markers Lab Results  Component Value Date   AST 27 09/22/2016   ALT 25 09/22/2016   ALBUMIN 4.1 09/22/2016   ALKPHOS 91 09/22/2016                 Electrolytes Lab Results  Component Value Date   NA 142 09/22/2016   K 3.7 09/22/2016   CL 107 09/22/2016   CALCIUM 9.0 09/22/2016   MG 1.7 03/11/2016                 Neuropathy Markers Lab Results  Component Value Date   VITAMINB12 226 03/11/2016                 Bone Pathology Markers Lab Results  Component Value Date   25OHVITD1 33 03/11/2016   25OHVITD2 <1.0 03/11/2016   25OHVITD3 33 03/11/2016                 Coagulation Parameters Lab Results  Component Value Date   INR 1.0 07/14/2013   LABPROT 13.0 07/14/2013   APTT 31.8 07/14/2013   PLT 188 09/22/2016                 Cardiovascular Markers Lab Results  Component Value Date   CKTOTAL 99 06/27/2011   CKMB 2.6 06/27/2011   TROPONINI < 0.02 06/27/2011   HGB 17.1 09/22/2016   HCT 50.0 09/22/2016                 CA Markers No results found for: CEA, CA125, LABCA2               Note: Lab results reviewed.  Recent Diagnostic Imaging Results  NM Myocar Multi W/Spect W/Wall Motion / EF  The study is normal.  This is a low risk study.  The left ventricular ejection fraction is mildly decreased (45-54%).  There was no ST segment deviation noted during stress.   Normal stress sestibmibi with no ischemia. EF near normal at  45-55% Low risk study  Complexity Note: Imaging results reviewed. Results shared with Mr. Macrae, using Layman's terms.                         Meds   Current Outpatient Medications:  .  amLODipine (NORVASC) 10 MG tablet, Take 10 mg by mouth every morning. , Disp: , Rfl:  .  benazepril (LOTENSIN) 20 MG tablet, Take 20 mg by mouth every morning. , Disp: , Rfl:  .  Cholecalciferol (VITAMIN D3) 2000 UNITS capsule, Take 2,000 Units by mouth daily. , Disp: , Rfl:  .  hydrALAZINE (APRESOLINE) 25 MG tablet, Take 25 mg by mouth 2 (two) times daily., Disp: , Rfl:  .  metoprolol tartrate (LOPRESSOR) 25 MG tablet, Take 25 mg by mouth 2 (two)  times daily. , Disp: , Rfl:  .  vitamin B-12 (CYANOCOBALAMIN) 500 MCG tablet, Take 500 mcg daily by mouth., Disp: , Rfl:   ROS  Constitutional: Denies any fever or chills Gastrointestinal: No reported hemesis, hematochezia, vomiting, or acute GI distress Musculoskeletal: Denies any acute onset joint swelling, redness, loss of ROM, or weakness Neurological: No reported episodes of acute onset apraxia, aphasia, dysarthria, agnosia, amnesia, paralysis, loss of coordination, or loss of consciousness  Allergies  Richard Duncan is allergic to propofol and propranolol.  [place]  Drug: Richard Duncan  reports that he does not use drugs. Alcohol:  reports that he drinks alcohol. Tobacco:  reports that he quit smoking about 50 years ago. His smoking use included cigarettes. He quit after 2.00 years of use. he has never used smokeless tobacco. Medical:  has a past medical history of Anemia, Anxiety, Arthropathia (11/08/2013), Bilateral carpal tunnel syndrome, Cancer ([place]) (2011), Carpal tunnel syndrome, Chronic kidney disease, Complication of anesthesia, Depression, Difficult intubation, Dyspnea, ED (erectile dysfunction), GERD (gastroesophageal reflux disease), Hernia, hiatal, History of blood product transfusion, History of hiatal hernia, History of kidney stones, Hyperlipemia,  Hypertension, Kidney stone, Neuropathy, Neuropathy, Obesity, Over weight, Presence of permanent cardiac pacemaker, Prostatitis, PTSD (post-traumatic stress disorder), Sleep apnea, and Urinary retention. Surgical: Richard Duncan  has a past surgical history that includes Cardiac pacemaker placement; Appendectomy; Cervical fusion; Tonsillectomy; Colonoscopy; Spinal cord stimulator insertion (N/A, 07/08/2014); Back surgery (1988, 2006); Back surgery; Cervical spine surgery (N/A, 2008); Excision of abdominal wall tumor (N/A, 03/08/2016); Cystoscopy with stent placement (Bilateral, 09/27/2016); Cystoscopy w/ ureteral stent placement (Bilateral, 11/01/2016); Ureteroscopy with holmium laser lithotripsy (Bilateral, 11/01/2016); Joint replacement (2004, 2005, 2014, 2015); Ureteroscopy with holmium laser lithotripsy (Left, 12/10/2016); Cystoscopy w/ ureteral stent placement (Left, 12/10/2016); Ureteroscopy with holmium laser lithotripsy (Left, 11/27/2016); and Cystoscopy w/ ureteral stent removal (Bilateral, 11/27/2016). Family: family history includes Benign prostatic hyperplasia in his father; Hypertension in his mother; Prostate cancer in his father.  Constitutional Exam  General appearance: Well nourished, well developed, and well hydrated. In no apparent acute distress Vitals:   02/12/17 0954 02/12/17 0957  BP:  (!) 159/96  Pulse: 85   Resp: 16   Temp: 97.8 F (36.6 C)   SpO2: 100%   Weight: (!) 370 lb (167.8 kg)   Height: '6\' 1"'  (1.854 m)    BMI Assessment: Estimated body mass index is 48.82 kg/m as calculated from the following:   Height as of this encounter: '6\' 1"'  (1.854 m).   Weight as of this encounter: 370 lb (167.8 kg).  BMI interpretation table: BMI level Category Range association with higher incidence of chronic pain  <18 kg/m2 Underweight   18.5-24.9 kg/m2 Ideal body weight   25-29.9 kg/m2 Overweight Increased incidence by 20%  30-34.9 kg/m2 Obese (Class I) Increased incidence by 68%  35-39.9  kg/m2 Severe obesity (Class II) Increased incidence by 136%  >40 kg/m2 Extreme obesity (Class III) Increased incidence by 254%   BMI Readings from Last 4 Encounters:  02/12/17 48.82 kg/m  12/10/16 48.16 kg/m  11/27/16 48.16 kg/m  11/01/16 48.02 kg/m   Wt Readings from Last 4 Encounters:  02/12/17 (!) 370 lb (167.8 kg)  12/10/16 (!) 365 lb (165.6 kg)  11/27/16 (!) 365 lb (165.6 kg)  11/01/16 (!) 364 lb (165.1 kg)  Psych/Mental status: Alert, oriented x 3 (person, place, & time)       Eyes: PERLA Respiratory: No evidence of acute respiratory distress  Cervical Spine Area Exam  Skin & Axial Inspection:  No masses, redness, edema, swelling, or associated skin lesions Alignment: Symmetrical Functional ROM: Decreased ROM      Stability: No instability detected Muscle Tone/Strength: Functionally intact. No obvious neuro-muscular anomalies detected. Sensory (Neurological): Movement-associated pain Palpation: No palpable anomalies              Upper Extremity (UE) Exam    Side: Right upper extremity  Side: Left upper extremity  Skin & Extremity Inspection: Skin color, temperature, and hair growth are WNL. No peripheral edema or cyanosis. No masses, redness, swelling, asymmetry, or associated skin lesions. No contractures.  Skin & Extremity Inspection: Skin color, temperature, and hair growth are WNL. No peripheral edema or cyanosis. No masses, redness, swelling, asymmetry, or associated skin lesions. No contractures.  Functional ROM: Unrestricted ROM          Functional ROM: Unrestricted ROM          Muscle Tone/Strength: Functionally intact. No obvious neuro-muscular anomalies detected.  Muscle Tone/Strength: Functionally intact. No obvious neuro-muscular anomalies detected.  Sensory (Neurological): Unimpaired          Sensory (Neurological): Unimpaired          Palpation: No palpable anomalies              Palpation: No palpable anomalies              Specialized Test(s): Deferred          Specialized Test(s): Deferred          Thoracic Spine Area Exam  Skin & Axial Inspection: No masses, redness, or swelling Alignment: Symmetrical Functional ROM: Unrestricted ROM Stability: No instability detected Muscle Tone/Strength: Functionally intact. No obvious neuro-muscular anomalies detected. Sensory (Neurological): Unimpaired Muscle strength & Tone: No palpable anomalies  Lumbar Spine Area Exam  Skin & Axial Inspection: No masses, redness, or swelling Alignment: Symmetrical Functional ROM: Unrestricted ROM      Stability: No instability detected Muscle Tone/Strength: Functionally intact. No obvious neuro-muscular anomalies detected. Sensory (Neurological): Unimpaired Palpation: No palpable anomalies       Provocative Tests: Lumbar Hyperextension and rotation test: evaluation deferred today       Lumbar Lateral bending test: evaluation deferred today       Patrick's Maneuver: evaluation deferred today                    Gait & Posture Assessment  Ambulation: Unassisted Gait: Relatively normal for age and body habitus Posture: WNL   Lower Extremity Exam    Side: Right lower extremity  Side: Left lower extremity  Skin & Extremity Inspection: Skin color, temperature, and hair growth are WNL. No peripheral edema or cyanosis. No masses, redness, swelling, asymmetry, or associated skin lesions. No contractures.  Skin & Extremity Inspection: Skin color, temperature, and hair growth are WNL. No peripheral edema or cyanosis. No masses, redness, swelling, asymmetry, or associated skin lesions. No contractures.  Functional ROM: Unrestricted ROM          Functional ROM: Unrestricted ROM          Muscle Tone/Strength: Functionally intact. No obvious neuro-muscular anomalies detected.  Muscle Tone/Strength: Functionally intact. No obvious neuro-muscular anomalies detected.  Sensory (Neurological): Unimpaired  Sensory (Neurological): Unimpaired  Palpation: No palpable anomalies   Palpation: No palpable anomalies   Assessment  Primary Diagnosis & Pertinent Problem List: The primary encounter diagnosis was Chronic neck pain [facility] source of pain) (Midline) (Bilateral). Diagnoses of Cervical facet syndrome (Bilateral), Cervicogenic headache (Bilateral) (L>R), and Cervical spondylosis were  also pertinent to this visit.  Status Diagnosis  Unimproved Unimproved Unimproved 1. Chronic neck pain (Tertiary source of pain) (Midline) (Bilateral)   2. Cervical facet syndrome (Bilateral)   3. Cervicogenic headache (Bilateral) (L>R)   4. Cervical spondylosis     Problems updated and reviewed during this visit: Problem  Ddd (Degenerative Disc Disease), Lumbar  Neuropathy  Chronic Pain Syndrome  Chronic hip pain (Primary Source of Pain) (Bilateral) (R>L)  Chronic low back pain (Secondary source of pain) (Bilateral) (R>L)  Chronic neck pain (Tertiary source of pain) (Midline) (Bilateral)  Prostate cancer (HCC)  Hyperlipidemia, Unspecified  Spondylosis of Lumbar Region Without Myelopathy Or Radiculopathy  Neck Pain  Numbness and Tingling   Plan of Care  Pharmacotherapy (Medications Ordered): No orders of the defined types were placed in this encounter.  Medications administered today: Holley Raring. Devin had no medications administered during this visit.   Procedure Orders     CERVICAL FACET (MEDIAL BRANCH NERVE BLOCK)  Lab Orders  No laboratory test(s) ordered today   Imaging Orders  No imaging studies ordered today   Referral Orders  No referral(s) requested today    Interventional management options: Planned, scheduled, and/or pending:   Diagnostic bilateral cervical facet block #2 under fluoroscopic guidance and IV sedation    Considering:   Diagnostic Thoracic epidural steroid injection  Diagnostic Lumbar epidural steroid injection  Diagnostic Transforaminal thoracic epidural steroid injection  Diagnostic Transforaminal lumbar epidural steroid  injection  Diagnostic Thoracic facet blocks  Possible thoracic facet RFA Diagnostic Bilateral Lumbar facet blocks  Possible Bilateral Lumbar facet RFA Diagnostic Lumbar sympathetic block  Possible lumbar sympathetic RFA    Palliative PRN treatment(s):   Diagnostic bilateral cervical facet block #2 under fluoroscopic guidance and IV sedation    Provider-requested follow-up: Return for Procedure (no sedation): (B) C-FCT.  Future Appointments  Date Time Provider [place]  02/13/2017  1:45 PM Milinda Pointer, MD [facility] [place] None   Primary Care Physician: Sofie Hartigan, MD Location: [facility] Outpatient Pain Management Facility Note by: Gaspar Cola, MD Date: 02/12/2017; Time: 12:55 PM

## 2017-02-12 NOTE — Patient Instructions (Addendum)
____________________________________________________________________________________________  Pain Scale  Introduction: The pain score used by this practice is the Verbal Numerical Rating Scale (VNRS-11). This is an 11-point scale. It is for adults and children 10 years or older. There are significant differences in how the pain score is reported, used, and applied. Forget everything you learned in the past and learn this scoring system.  General Information: The scale should reflect your current level of pain. Unless you are specifically asked for the level of your worst pain, or your average pain. If you are asked for one of these two, then it should be understood that it is over the past 24 hours.  Basic Activities of Daily Living (ADL): Personal hygiene, dressing, eating, transferring, and using restroom.  Instructions: Most patients tend to report their level of pain as a combination of two factors, their physical pain and their psychosocial pain. This last one is also known as "suffering" and it is reflection of how physical pain affects you socially and psychologically. From now on, report them separately. From this point on, when asked to report your pain level, report only your physical pain. Use the following table for reference.  Pain Clinic Pain Levels (0-5/10)  Pain Level Score  Description  No Pain 0   Mild pain 1 Nagging, annoying, but does not interfere with basic activities of daily living (ADL). Patients are able to eat, bathe, get dressed, toileting (being able to get on and off the toilet and perform personal hygiene functions), transfer (move in and out of bed or a chair without assistance), and maintain continence (able to control bladder and bowel functions). Blood pressure and heart rate are unaffected. A normal heart rate for a healthy adult ranges from 60 to 100 bpm (beats per minute).   Mild to moderate pain 2 Noticeable and distracting. Impossible to hide from other  people. More frequent flare-ups. Still possible to adapt and function close to normal. It can be very annoying and may have occasional stronger flare-ups. With discipline, patients may get used to it and adapt.   Moderate pain 3 Interferes significantly with activities of daily living (ADL). It becomes difficult to feed, bathe, get dressed, get on and off the toilet or to perform personal hygiene functions. Difficult to get in and out of bed or a chair without assistance. Very distracting. With effort, it can be ignored when deeply involved in activities.   Moderately severe pain 4 Impossible to ignore for more than a few minutes. With effort, patients may still be able to manage work or participate in some social activities. Very difficult to concentrate. Signs of autonomic nervous system discharge are evident: dilated pupils (mydriasis); mild sweating (diaphoresis); sleep interference. Heart rate becomes elevated (>115 bpm). Diastolic blood pressure (lower number) rises above 100 mmHg. Patients find relief in laying down and not moving.   Severe pain 5 Intense and extremely unpleasant. Associated with frowning face and frequent crying. Pain overwhelms the senses.  Ability to do any activity or maintain social relationships becomes significantly limited. Conversation becomes difficult. Pacing back and forth is common, as getting into a comfortable position is nearly impossible. Pain wakes you up from deep sleep. Physical signs will be obvious: pupillary dilation; increased sweating; goosebumps; brisk reflexes; cold, clammy hands and feet; nausea, vomiting or dry heaves; loss of appetite; significant sleep disturbance with inability to fall asleep or to remain asleep. When persistent, significant weight loss is observed due to the complete loss of appetite and sleep deprivation.  Blood   pressure and heart rate becomes significantly elevated. Caution: If elevated blood pressure triggers a pounding headache  associated with blurred vision, then the patient should immediately seek attention at an urgent or emergency care unit, as these may be signs of an impending stroke.    Emergency Department Pain Levels (6-10/10)  Emergency Room Pain 6 Severely limiting. Requires emergency care and should not be seen or managed at an outpatient pain management facility. Communication becomes difficult and requires great effort. Assistance to reach the emergency department may be required. Facial flushing and profuse sweating along with potentially dangerous increases in heart rate and blood pressure will be evident.   Distressing pain 7 Self-care is very difficult. Assistance is required to transport, or use restroom. Assistance to reach the emergency department will be required. Tasks requiring coordination, such as bathing and getting dressed become very difficult.   Disabling pain 8 Self-care is no longer possible. At this level, pain is disabling. The individual is unable to do even the most "basic" activities such as walking, eating, bathing, dressing, transferring to a bed, or toileting. Fine motor skills are lost. It is difficult to think clearly.   Incapacitating pain 9 Pain becomes incapacitating. Thought processing is no longer possible. Difficult to remember your own name. Control of movement and coordination are lost.   The worst pain imaginable 10 At this level, most patients pass out from pain. When this level is reached, collapse of the autonomic nervous system occurs, leading to a sudden drop in blood pressure and heart rate. This in turn results in a temporary and dramatic drop in blood flow to the brain, leading to a loss of consciousness. Fainting is one of the body's self defense mechanisms. Passing out puts the brain in a calmed state and causes it to shut down for a while, in order to begin the healing process.    Summary: 1. Refer to this scale when providing [place] with your pain level. 2. Be  accurate and careful when reporting your pain level. This will help with your care. 3. Over-reporting your pain level will lead to loss of credibility. 4. Even a level of 1/10 means that there is pain and will be treated at our facility. 5. High, inaccurate reporting will be documented as "Symptom Exaggeration", leading to loss of credibility and suspicions of possible secondary gains such as obtaining more narcotics, or wanting to appear disabled, for fraudulent reasons. 6. Only pain levels of 5 or below will be seen at our facility. 7. Pain levels of 6 and above will be sent to the Emergency Department and the appointment cancelled. ____________________________________________________________________________________________    Facet Joint Block The facet joints connect the bones of the spine (vertebrae). They make it possible for you to bend, twist, and make other movements with your spine. They also keep you from bending too far, twisting too far, and making other excessive movements. A facet joint block is a procedure where a numbing medicine (anesthetic) is injected into a facet joint. Often, a type of anti-inflammatory medicine called a steroid is also injected. A facet joint block may be done to diagnose neck or back pain. If the pain gets better after a facet joint block, it means the pain is probably coming from the facet joint. If the pain does not get better, it means the pain is probably not coming from the facet joint. A facet joint block may also be done to relieve neck or back pain caused by an inflamed facet joint.  A facet joint block is only done to relieve pain if the pain does not improve with other methods, such as medicine, exercise programs, and physical therapy. Tell a health care provider about:  Any allergies you have.  All medicines you are taking, including vitamins, herbs, eye drops, creams, and over-the-counter medicines.  Any problems you or family members have had  with anesthetic medicines.  Any blood disorders you have.  Any surgeries you have had.  Any medical conditions you have.  Whether you are pregnant or may be pregnant. What are the risks? Generally, this is a safe procedure. However, problems may occur, including:  Bleeding.  Injury to a nerve near the injection site.  Pain at the injection site.  Weakness or numbness in areas controlled by nerves near the injection site.  Infection.  Temporary fluid retention.  Allergic reactions to medicines or dyes.  Injury to other structures or organs near the injection site.  What happens before the procedure?  Follow instructions from your health care provider about eating or drinking restrictions.  Ask your health care provider about: ? Changing or stopping your regular medicines. This is especially important if you are taking diabetes medicines or blood thinners. ? Taking medicines such as aspirin and ibuprofen. These medicines can thin your blood. Do not take these medicines before your procedure if your health care provider instructs you not to.  Do not take any new dietary supplements or medicines without asking your health care provider first.  Plan to have someone take you home after the procedure. What happens during the procedure?  You may need to remove your clothing and dress in an open-back gown.  The procedure will be done while you are lying on an X-ray table. You will most likely be asked to lie on your stomach, but you may be asked to lie in a different position if an injection will be made in your neck.  Machines will be used to monitor your oxygen levels, heart rate, and blood pressure.  If an injection will be made in your neck, an IV tube will be inserted into one of your veins. Fluids and medicine will flow directly into your body through the IV tube.  The area over the facet joint where the injection will be made will be cleaned with soap. The surrounding  skin will be covered with clean drapes.  A numbing medicine (local anesthetic) will be applied to your skin. Your skin may sting or burn for a moment.  A video X-ray machine (fluoroscopy) will be used to locate the joint. In some cases, a CT scan may be used.  A contrast dye may be injected into the facet joint area to help locate the joint.  When the joint is located, an anesthetic will be injected into the joint through the needle.  Your health care provider will ask you whether you feel pain relief. If you do feel relief, a steroid may be injected to provide pain relief for a longer period of time. If you do not feel relief or feel only partial relief, additional injections of an anesthetic may be made in other facet joints.  The needle will be removed.  Your skin will be cleaned.  A bandage (dressing) will be applied over each injection site. The procedure may vary among health care providers and hospitals. What happens after the procedure?  You will be observed for 15-30 minutes before being allowed to go home. This information is not intended to replace  advice given to you by your health care provider. Make sure you discuss any questions you have with your health care provider. Document Released: 06/05/2006 Document Revised: 02/15/2015 Document Reviewed: 10/10/2014 Elsevier Interactive Patient Education  2018 [employer].  Epidural Steroid Injection An epidural steroid injection is a shot of steroid medicine and numbing medicine that is given into the space between the spinal cord and the bones in your back (epidural space). The shot helps relieve pain caused by an irritated or swollen nerve root. The amount of pain relief you get from the injection depends on what is causing the nerve to be swollen and irritated, and how long your pain lasts. You are more likely to benefit from this injection if your pain is strong and comes on suddenly rather than if you have had pain for a long  time. Tell a health care provider about:  Any allergies you have.  All medicines you are taking, including vitamins, herbs, eye drops, creams, and over-the-counter medicines.  Any problems you or family members have had with anesthetic medicines.  Any blood disorders you have.  Any surgeries you have had.  Any medical conditions you have.  Whether you are pregnant or may be pregnant. What are the risks? Generally, this is a safe procedure. However, problems may occur, including:  Headache.  Bleeding.  Infection.  Allergic reaction to medicines.  Damage to your nerves.  What happens before the procedure? Staying hydrated Follow instructions from your health care provider about hydration, which may include:  Up to 2 hours before the procedure - you may continue to drink clear liquids, such as water, clear fruit juice, black coffee, and plain tea.  Eating and drinking restrictions Follow instructions from your health care provider about eating and drinking, which may include:  8 hours before the procedure - stop eating heavy meals or foods such as meat, fried foods, or fatty foods.  6 hours before the procedure - stop eating light meals or foods, such as toast or cereal.  6 hours before the procedure - stop drinking milk or drinks that contain milk.  2 hours before the procedure - stop drinking clear liquids.  Medicine  You may be given medicines to lower anxiety.  Ask your health care provider about: ? Changing or stopping your regular medicines. This is especially important if you are taking diabetes medicines or blood thinners. ? Taking medicines such as aspirin and ibuprofen. These medicines can thin your blood. Do not take these medicines before your procedure if your health care provider instructs you not to. General instructions  Plan to have someone take you home from the hospital or clinic. What happens during the procedure?  You may receive a medicine  to help you relax (sedative).  You will be asked to lie on your abdomen.  The injection site will be cleaned.  A numbing medicine (local anesthetic) will be used to numb the injection site.  A needle will be inserted through your skin into the epidural space. You may feel some discomfort when this happens. An X-ray machine will be used to make sure the needle is put as close as possible to the affected nerve.  A steroid medicine and a local anesthetic will be injected into the epidural space.  The needle will be removed.  A bandage (dressing) will be put over the injection site. What happens after the procedure?  Your blood pressure, heart rate, breathing rate, and blood oxygen level will be monitored until the  medicines you were given have worn off.  Your arm or leg may feel weak or numb for a few hours.  The injection site may feel sore.  Do not drive for 24 hours if you received a sedative. This information is not intended to replace advice given to you by your health care provider. Make sure you discuss any questions you have with your health care provider. Document Released: 04/23/2007 Document Revised: 06/28/2015 Document Reviewed: 05/02/2015 Elsevier Interactive Patient Education  [employer].

## 2017-02-13 ENCOUNTER — Ambulatory Visit: Payer: Medicare Other | Admitting: Pain Medicine

## 2017-03-05 ENCOUNTER — Other Ambulatory Visit: Payer: Self-pay | Admitting: Family Medicine

## 2017-03-05 DIAGNOSIS — R1084 Generalized abdominal pain: Secondary | ICD-10-CM

## 2017-03-26 ENCOUNTER — Ambulatory Visit
Admission: RE | Admit: 2017-03-26 | Discharge: 2017-03-26 | Disposition: A | Discharge status: Home / Self Care | Payer: Medicare Other | Source: Ambulatory Visit | Attending: Family Medicine | Admitting: Family Medicine

## 2017-03-26 DIAGNOSIS — N200 Calculus of kidney: Secondary | ICD-10-CM | POA: Diagnosis not present

## 2017-03-26 DIAGNOSIS — M899 Disorder of bone, unspecified: Secondary | ICD-10-CM | POA: Diagnosis not present

## 2017-03-26 DIAGNOSIS — R1084 Generalized abdominal pain: Secondary | ICD-10-CM | POA: Insufficient documentation

## 2017-03-26 DIAGNOSIS — I700 Atherosclerosis of aorta: Secondary | ICD-10-CM | POA: Diagnosis not present

## 2017-03-26 DIAGNOSIS — N400 Enlarged prostate without lower urinary tract symptoms: Secondary | ICD-10-CM | POA: Insufficient documentation

## 2017-03-26 MED ORDER — IOPAMIDOL (ISOVUE-370) INJECTION 76%
100.0000 mL | Freq: Once | INTRAVENOUS | Status: AC | PRN
  Administered 2017-03-26: 100 mL via INTRAVENOUS

## 2017-04-07 ENCOUNTER — Telehealth: Payer: Self-pay | Admitting: Urology

## 2017-04-07 DIAGNOSIS — C61 Malignant neoplasm of prostate: Secondary | ICD-10-CM

## 2017-04-07 NOTE — Telephone Encounter (Signed)
Order placed

## 2017-04-07 NOTE — Telephone Encounter (Signed)
Patient has an appointment on Friday, 3/15 and would like to go to the lab prior to his appointment for PSA.  Please place an order for the [facility] lab for a PSA.

## 2017-04-10 NOTE — Progress Notes (Signed)
7:11 PM   SAHITH NURSE Apr 24, 1950 [id number]  Referring provider: Sofie Hartigan, MD [place], [place] [address]  Chief Complaint  Patient presents with  . Prostate Cancer    HPI: Mr. Eichelberger is a 67 year old Caucasian male with prostate cancer, BPH with LU TS, erectile dysfunction and nephrolithiasis who presents today for a 6 month follow-up.  Prostate cancer Patient first underwent TRUSPBx of prostate in 2010 for a PSA of 4 and found to have a Gleason's 6. 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  His most recent PSA at the time of this visit was 2.56 ng/mL in 04/2016.      BPH WITH LUTS: His IPSS score today is 19, which is moderate lower urinary tract symptomatology. He is mixed with his quality life due to his urinary symptoms. His previous IPSS score was 26/5.  He has no major complaints today.  He denies any dysuria, hematuria or suprapubic pain.  Patient found good symptom control on Cialis 5 mg daily, but he found the prescription cost prohibitive and had to discontinue the medication.  He did not find tamsulosin effective.  He also denies any recent fevers, chills, nausea or vomiting.  He has a family history of PCa, with his father being diagnosed with PCa.  [place] Name 04/11/17 0900         International Prostate Symptom Score   How often have you had the sensation of not emptying your bladder?  More than half the time     How often have you had to urinate less than every two hours?  More than half the time     How often have you found you stopped and started again several times when you urinated?  Less than half the time     How often have you found it difficult to postpone urination?  About half the time     How often have you had a weak urinary stream?  About half the time     How often have you had to strain to start urination?  Less  than 1 in 5 times     How many times did you typically get up at night to urinate?  2 Times     Total IPSS Score  19       Quality of Life due to urinary symptoms   If you were to spend the rest of your life with your urinary condition just the way it is now how would you feel about that?  Mixed        Score:  1-7 Mild 8-19 Moderate 20-35 Severe  Erectile dysfunction His SHIM score is 9, which is moderate ED.   His previous SHIM score was 7.  He has been having difficulty with erections for several years   His risk factors for ED are age, BPH, prostate cancer and HTN   He denies any painful erections or curvatures with his erections.   He is no longer having spontaneous erections.  He has tried PDE5i and a vacuum erect aid device in the past.   SHIM    [place] Name 04/11/17 0933         SHIM: Over the last 6 months:   How do you rate your confidence that you could get and keep an erection?  Very Low  When you had erections with sexual stimulation, how often were your erections hard enough for penetration (entering your partner)?  A Few Times (much less than half the time)     During sexual intercourse, how often were you able to maintain your erection after you had penetrated (entered) your partner?  A Few Times (much less than half the time)     During sexual intercourse, how difficult was it to maintain your erection to completion of intercourse?  Very Difficult     When you attempted sexual intercourse, how often was it satisfactory for you?  A Few Times (much less than half the time)       SHIM Total Score   SHIM  9        Score: 1-7 Severe ED 8-11 Moderate ED 12-16 Mild-Moderate ED 17-21 Mild ED 22-25 No ED  History of nephrolithiasis Patient underwent staged procedures for bilateral ureteral stones which was completed in 11/2016.  Contrast CT performed in 02/2017 noted no hydronephrosis.  He has not had any current flank pain or gross hematuria.    PMH: Past Medical  History:  Diagnosis Date  . Anemia   . Anxiety   . 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 Bilateral carpal tunnel syndrome   . Cancer [facility]) 2011   Prostate Ca  . Carpal tunnel syndrome   . Chronic kidney disease   . Complication of anesthesia    urinary retention with Popanol  . Depression   . Difficult intubation   . Dyspnea    RECENT-SUPPOSED TO HAVE STRESS TEST 09-26-16 WITH DR Ubaldo Glassing BUT CANCELLED DUE TO STENT PLACEMENT ON 09-27-16  . ED (erectile dysfunction)   . GERD (gastroesophageal reflux disease)    rare  . Hernia, hiatal   . History of blood product transfusion   . History of hiatal hernia   . History of kidney stones   . Hyperlipemia   . Hypertension   . Kidney stone   . Neuropathy   . Neuropathy   . Obesity   . Over weight   . Presence of permanent cardiac pacemaker    [employer]  . Prostatitis   . PTSD (post-traumatic stress disorder)   . Sleep apnea    OSA--use Bi-PAP  . Urinary retention     Surgical History: Past Surgical History:  Procedure Laterality Date  . APPENDECTOMY    . [place], 2006   Lumbar Spine  . BACK SURGERY     x2 Laminectomy  . CARDIAC PACEMAKER PLACEMENT    . CERVICAL FUSION    . CERVICAL SPINE SURGERY N/A 2008  . COLONOSCOPY    . CYSTOSCOPY W/ URETERAL STENT PLACEMENT Bilateral 11/01/2016   Procedure: CYSTOSCOPY WITH STENT REPLACEMENT;  Surgeon: [name], MD;  Location: [facility];  Service: Urology;  Laterality: Bilateral;  . CYSTOSCOPY W/ URETERAL STENT PLACEMENT Left 12/10/2016   Procedure: CYSTOSCOPY WITH STENT REPLACEMENT;  Surgeon: Abbie Sons, MD;  Location: [facility];  Service: Urology;  Laterality: Left;  . CYSTOSCOPY W/ URETERAL STENT REMOVAL Bilateral 11/27/2016   Procedure: CYSTOSCOPY WITH STENT REMOVAL/EXCHANGE;  Surgeon: [name], MD;  Location: [facility];  Service: Urology;  Laterality: Bilateral;  . CYSTOSCOPY WITH STENT PLACEMENT Bilateral 09/27/2016   Procedure: CYSTOSCOPY WITH STENT  PLACEMENT;  Surgeon: [name], MD;  Location: [facility];  Service: Urology;  Laterality: Bilateral;  . EXCISION OF ABDOMINAL WALL TUMOR N/A 03/08/2016   Procedure: EXCISION OF ABDOMINAL WALL MASS;  Surgeon: Delfina Redwood  Rochel Brome, MD;  Location: [facility];  Service: General;  Laterality: N/A;  . JOINT REPLACEMENT  2004, 2005, 2014, 2015   Bilat Hip Replacements  . SPINAL CORD STIMULATOR INSERTION N/A 07/08/2014   Procedure: LUMBAR SPINAL CORD STIMULATOR INSERTION;  Surgeon: Clydell Hakim, MD;  Location: [place] [facility];  Service: Neurosurgery;  Laterality: N/A;  LUMBAR SPINAL CORD STIMULATOR INSERTION  . TONSILLECTOMY    . URETEROSCOPY WITH HOLMIUM LASER LITHOTRIPSY Bilateral 11/01/2016   Procedure: URETEROSCOPY WITH HOLMIUM LASER LITHOTRIPSY;  Surgeon: [name], MD;  Location: [facility];  Service: Urology;  Laterality: Bilateral;  . URETEROSCOPY WITH HOLMIUM LASER LITHOTRIPSY Left 12/10/2016   Procedure: URETEROSCOPY WITH HOLMIUM LASER LITHOTRIPSY;  Surgeon: Abbie Sons, MD;  Location: [facility];  Service: Urology;  Laterality: Left;  . URETEROSCOPY WITH HOLMIUM LASER LITHOTRIPSY Left 11/27/2016   Procedure: URETEROSCOPY WITH HOLMIUM LASER LITHOTRIPSY;  Surgeon: [name], MD;  Location: [facility];  Service: Urology;  Laterality: Left;    Home Medications:  Allergies as of 04/11/2017      Reactions   Propofol Other (See Comments)   URINARY RETENTION REQUIRING CATHETERIZATION   Propranolol Other (See Comments)   Urinary retention Urinary retention      Medication List        Accurate as of 04/11/17 11:59 PM. Always use your most recent med list.          amLODipine 10 MG tablet Commonly known as:  NORVASC Take 10 mg by mouth every morning.   benazepril 20 MG tablet Commonly known as:  LOTENSIN Take 20 mg by mouth every morning.   citalopram 40 MG tablet Commonly known as:  CELEXA   hydrALAZINE 25 MG tablet Commonly known as:  APRESOLINE Take 25 mg by mouth 2  (two) times daily.   metoprolol tartrate 25 MG tablet Commonly known as:  LOPRESSOR Take 25 mg by mouth 2 (two) times daily.   vitamin B-12 500 MCG tablet Commonly known as:  CYANOCOBALAMIN Take 500 mcg daily by mouth.   Vitamin D3 2000 units capsule Take 2,000 Units by mouth daily.       Allergies:  Allergies  Allergen Reactions  . Propofol Other (See Comments)    URINARY RETENTION REQUIRING CATHETERIZATION  . Propranolol Other (See Comments)    Urinary retention Urinary retention    Family History: Family History  Problem Relation Age of Onset  . Benign prostatic hyperplasia Father   . Prostate cancer Father   . Hypertension Mother   . Kidney disease Neg Hx   . Kidney cancer Neg Hx   . Bladder Cancer Neg Hx     Social History:  reports that he quit smoking about 50 years ago. His smoking use included cigarettes. He quit after 2.00 years of use. He has never used smokeless tobacco. He reports that he drinks alcohol. He reports that he does not use drugs.  ROS: UROLOGY Frequent Urination?: No Hard to postpone urination?: No Burning/pain with urination?: No Get up at night to urinate?: No Leakage of urine?: No Urine stream starts and stops?: No Trouble starting stream?: No Do you have to strain to urinate?: No Blood in urine?: No Urinary tract infection?: No Sexually transmitted disease?: No Injury to kidneys or bladder?: No Painful intercourse?: No Weak stream?: No Erection problems?: No Penile pain?: No  Gastrointestinal Nausea?: No Vomiting?: No Indigestion/heartburn?: No Diarrhea?: No Constipation?: No  Constitutional Fever: No Night sweats?: No Weight loss?: No Fatigue?: No  Skin Skin  rash/lesions?: No Itching?: Yes  Eyes Blurred vision?: No Double vision?: No  Ears/Nose/Throat Sore throat?: No Sinus problems?: No  Hematologic/Lymphatic Swollen glands?: No Easy bruising?: No  Cardiovascular Leg swelling?: No Chest pain?:  No  Respiratory Cough?: No Shortness of breath?: Yes  Endocrine Excessive thirst?: No  Musculoskeletal Back pain?: Yes Joint pain?: Yes  Neurological Headaches?: No Dizziness?: No  Psychologic Depression?: No Anxiety?: Yes  Physical Exam: BP (!) 142/94   Pulse (!) 112   Ht 6\' 1"  (1.854 m)   Wt (!) 365 lb (165.6 kg)   BMI 48.16 kg/m   Constitutional: Well nourished. Alert and oriented, No acute distress. HEENT: [place] AT, moist mucus membranes. Trachea midline, no masses. Cardiovascular: No clubbing, cyanosis, or edema. Respiratory: Normal respiratory effort, no increased work of breathing. GI: Abdomen is soft, non tender, non distended, no abdominal masses. Liver and spleen not palpable.  No hernias appreciated.  Stool sample for occult testing is not indicated.   GU: No CVA tenderness.  No bladder fullness or masses.  Patient with circumcised phallus.  Urethral meatus is patent.  No penile discharge. No penile lesions or rashes. Scrotum without lesions, cysts, rashes and/or edema.  Testicles are located scrotally bilaterally. No masses are appreciated in the testicles. Left and right epididymis are normal. Rectal: Patient with  normal sphincter tone. Anus and perineum without scarring or rashes. No rectal masses are appreciated. Prostate is approximately 50 grams, no nodules are appreciated. Seminal vesicles are normal. Skin: No rashes, bruises or suspicious lesions. Lymph: No cervical or inguinal adenopathy. Neurologic: Grossly intact, no focal deficits, moving all 4 extremities. Psychiatric: Normal mood and affect.    Laboratory Data: Lab Results  Component Value Date   WBC 13.4 (H) 09/22/2016   HGB 17.1 09/22/2016   HCT 50.0 09/22/2016   MCV 92.9 09/22/2016   PLT 188 09/22/2016    Lab Results  Component Value Date   CREATININE 1.20 09/22/2016   PSA history:      3.0 ng/mL on 11/25/2011      3.2 ng/mL on 05/25/2012-biopsy in May      3.4 ng/mL on  09/11/2012      1.7 ng/mL on 03/01/2013      1.4 ng/mL on 06/30/2013      2.4 ng/mL on 11/09/2013      2.1 ng/mL on 03/11/2014      2.7 ng/mL on 05/06/2015      2.6 ng/mL on 11/13/2015      2.56 ng/mL in 04/2016  I have reviewed the labs.  Pertinent Imaging CLINICAL DATA:  Prostate cancer.  Abdominal pain.  EXAM: CT ABDOMEN AND PELVIS WITH CONTRAST  TECHNIQUE: Multidetector CT imaging of the abdomen and pelvis was performed using the standard protocol following bolus administration of intravenous contrast.  CONTRAST:  142mL ISOVUE-370 IOPAMIDOL (ISOVUE-370) INJECTION 76%  COMPARISON:  09/22/2016  FINDINGS: Lower chest: No acute abnormality.  Hepatobiliary: 6 mm low-density structure within left lobe of liver is unchanged, image 12/2. 9 mm low-density structure in the medial segment of left lobe of liver is also unchanged, image 19/2. The gallbladder appears normal. No biliary dilatation.  Pancreas: Unremarkable. No pancreatic ductal dilatation or surrounding inflammatory changes.  Spleen: Normal in size without focal abnormality.  Adrenals/Urinary Tract: Normal right adrenal gland. Low-attenuation left adrenal nodule measuring 1.2 cm is unchanged compatible with a benign adenoma. Small kidney stones. The largest is in the inferior pole of the right kidney measuring 4 mm. No mass or hydronephrosis. Mild  bilateral renal cortical scarring noted. No bladder calculi identified.  Stomach/Bowel: Stomach is normal. The small bowel loops are unremarkable. Normal appearance of the colon.  Vascular/Lymphatic: Aortic atherosclerosis. No aneurysm. No abdominal or pelvic adenopathy.  Reproductive: Prostate gland is difficult to visualize due to bilateral hip arthroplasty devices but measures approximately 4.8 x 5.1 x 7.3 cm (volume = 94 cm^3).  Other: No free fluid or fluid collections within the abdomen or pelvis.  Musculoskeletal: Degenerative disc disease  is identified within the lumbar spine. Stable asymmetric sclerotic lesion in the right iliac bone measuring 2.6 cm, image 64 of series 2. Advanced spondylosis is identified throughout the thoracic and lumbar spine.  IMPRESSION: 1. No acute findings identified within the abdomen or pelvis. 2. Small bilateral renal calculi.  No hydronephrosis. 3. Prostate gland enlargement. No evidence for metastatic adenopathy within the abdomen or pelvis. 4. Stable sclerotic lesion within the right iliac bone. 5.  Aortic Atherosclerosis (ICD10-I70.0).   Electronically Signed   By: Kerby Moors M.D.   On: 03/26/2017 14:25  I have reviewed the films   Assessment & Plan:   1. Prostate cancer:   Patient first underwent TRUSPBx of prostate in 2010 for a PSA of 4 and found to have a Gleason's 6. 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  Patient is currently on active surveillance.  His current PSA is 2.56 ng/mL in 04/2016.  He will follow-up in 6 months time for an exam and PSA if PSA is stable  2. BPH with LUTS  - IPSS score is 19/3, it is improving  - Continue conservative management, avoiding bladder irritants and timed voiding's  - patient has failed medical therapy, but he is not interested in pursuing a possible outlet procedure at this time  - RTC in 6 months for IPSS, PSA, PVR and exam   3. Erectile dysfunction  - SHIM score was 9, it is improving  - brand name and generic 100 mg Viagra were cost prohibitive  - RTC in 6 months for SHIM and exam  4. Bilateral Nephrolithiasis KUB in 2020 Ordered [facility] to contact our office or seek treatment in the ED if becomes febrile or pain/ vomiting are difficult control in order to arrange for emergent/urgent intervention     Return for pending PSA results.  Zara Council, [facility] [address] [place], [place] [address] [phone]

## 2017-04-11 ENCOUNTER — Ambulatory Visit (INDEPENDENT_AMBULATORY_CARE_PROVIDER_SITE_OTHER): Payer: Medicare Other | Admitting: Urology

## 2017-04-11 ENCOUNTER — Other Ambulatory Visit
Admission: RE | Admit: 2017-04-11 | Discharge: 2017-04-11 | Disposition: A | Discharge status: Home / Self Care | Payer: Medicare Other | Source: Ambulatory Visit | Attending: Urology | Admitting: Urology

## 2017-04-11 ENCOUNTER — Encounter: Payer: Self-pay | Admitting: Urology

## 2017-04-11 ENCOUNTER — Telehealth: Payer: Self-pay | Admitting: Urology

## 2017-04-11 VITALS — BP 142/94 | HR 112 | Ht 73.0 in | Wt 365.0 lb

## 2017-04-11 DIAGNOSIS — C61 Malignant neoplasm of prostate: Secondary | ICD-10-CM

## 2017-04-11 DIAGNOSIS — N138 Other obstructive and reflux uropathy: Secondary | ICD-10-CM

## 2017-04-11 DIAGNOSIS — N200 Calculus of kidney: Secondary | ICD-10-CM

## 2017-04-11 DIAGNOSIS — N529 Male erectile dysfunction, unspecified: Secondary | ICD-10-CM

## 2017-04-11 DIAGNOSIS — N401 Enlarged prostate with lower urinary tract symptoms: Secondary | ICD-10-CM

## 2017-04-11 LAB — PSA: Prostatic Specific Antigen: 3.6 ng/mL (ref 0.00–4.00)

## 2017-04-11 NOTE — Telephone Encounter (Signed)
Would you order a Litholink on this patient? 

## 2017-04-11 NOTE — Telephone Encounter (Signed)
Orders faxed

## 2017-04-14 ENCOUNTER — Telehealth: Payer: Self-pay

## 2017-04-14 DIAGNOSIS — C61 Malignant neoplasm of prostate: Secondary | ICD-10-CM

## 2017-04-14 NOTE — Telephone Encounter (Signed)
Pt called today and left message asking about PSA.  Please give pt a call

## 2017-04-14 NOTE — Telephone Encounter (Signed)
-----   Message from Nori Riis, PA-C sent at 04/14/2017 12:12 PM EDT ----- Left a message for patient to call back.  He is on active surveillance for prostate cancer and his PSA has increased.  He needs to have another prostate biopsy for restaging.

## 2017-04-15 DIAGNOSIS — Z0289 Encounter for other administrative examinations: Secondary | ICD-10-CM | POA: Insufficient documentation

## 2017-04-16 NOTE — Telephone Encounter (Signed)
-----   Message from Nori Riis, PA-C sent at 04/15/2017  7:37 AM EDT ----- I spoke with Dr. Erlene Quan last night and she suggests to keep monitoring the PSA.  We will see him in 3 months for a PSA only.

## 2017-04-16 NOTE — Telephone Encounter (Signed)
Spoke with pt in reference to monitoring PSA in 60mo. Pt voiced understanding. Lab appt and orders placed.

## 2017-05-29 ENCOUNTER — Telehealth: Payer: Self-pay | Admitting: Urology

## 2017-05-29 NOTE — Telephone Encounter (Signed)
Christina from [facility] called asking for a return call needing a ICD-10 code for this patient. She can be reached at [phone]  Thanks, Sharyn Lull

## 2017-05-29 NOTE — Telephone Encounter (Signed)
Called and lmom for Altha Harm, informed her to use code N20.0

## 2017-06-02 ENCOUNTER — Other Ambulatory Visit: Payer: Self-pay | Admitting: Urology

## 2017-06-03 ENCOUNTER — Telehealth: Payer: Self-pay | Admitting: Family Medicine

## 2017-06-03 NOTE — Telephone Encounter (Signed)
LMOM and sent Mychart message with results

## 2017-06-03 NOTE — Telephone Encounter (Signed)
-----   Message from Nori Riis, PA-C sent at 06/03/2017 10:24 AM EDT ----- Please let Mr. Renbarger know that his Litholink results indicate that he needs to pursue a low oxalate diet ([email or web address].com has a list of foods high in oxalates), to consume 1200 grams of calcium daily from food ([email or web address].com has a list of foods calcium content) and reduce his animal protein consumption to 7 ounces daily.  We can send him a copy of of the results as well for his reference.

## 2017-06-04 ENCOUNTER — Other Ambulatory Visit: Payer: Self-pay | Admitting: Family Medicine

## 2017-06-04 DIAGNOSIS — N200 Calculus of kidney: Secondary | ICD-10-CM

## 2017-06-04 MED ORDER — POTASSIUM CITRATE ER 15 MEQ (1620 MG) PO TBCR: 1.0000 | EXTENDED_RELEASE_TABLET | Freq: Three times a day (TID) | ORAL | 0 refills | Status: DC

## 2017-06-04 NOTE — Telephone Encounter (Signed)
Urocrit sent to pharmacy

## 2017-06-20 ENCOUNTER — Other Ambulatory Visit: Payer: Self-pay | Admitting: Urology

## 2017-07-15 ENCOUNTER — Telehealth: Payer: Self-pay | Admitting: Urology

## 2017-07-15 NOTE — Telephone Encounter (Signed)
Pt LM on vm asking to have his upcoming labs to be drawn in [place], please check orders and advise pt if he may get his labs drawn at [facility] lab. Thank you.

## 2017-07-17 ENCOUNTER — Other Ambulatory Visit: Payer: Medicare Other

## 2017-07-17 DIAGNOSIS — C61 Malignant neoplasm of prostate: Secondary | ICD-10-CM

## 2017-07-17 DIAGNOSIS — N200 Calculus of kidney: Secondary | ICD-10-CM

## 2017-07-17 LAB — MICROSCOPIC EXAMINATION

## 2017-07-17 LAB — URINALYSIS, COMPLETE
Bilirubin, UA: NEGATIVE
Glucose, UA: NEGATIVE
Ketones, UA: NEGATIVE
Leukocytes, UA: NEGATIVE
Nitrite, UA: NEGATIVE
Specific Gravity, UA: 1.025 (ref 1.005–1.030)
Urobilinogen, Ur: 0.2 mg/dL (ref 0.2–1.0)
pH, UA: 5.5 (ref 5.0–7.5)

## 2017-07-17 NOTE — Telephone Encounter (Signed)
Pt had labs drawn today

## 2017-07-18 LAB — BASIC METABOLIC PANEL
BUN/Creatinine Ratio: 16 (ref 10–24)
BUN: 18 mg/dL (ref 8–27)
CO2: 21 mmol/L (ref 20–29)
Calcium: 9.3 mg/dL (ref 8.6–10.2)
Chloride: 108 mmol/L — ABNORMAL HIGH (ref 96–106)
Creatinine, Ser: 1.13 mg/dL (ref 0.76–1.27)
GFR calc Af Amer: 78 mL/min/{1.73_m2} (ref >59)
GFR calc non Af Amer: 68 mL/min/{1.73_m2} (ref >59)
Glucose: 117 mg/dL — ABNORMAL HIGH (ref 65–99)
Potassium: 4.2 mmol/L (ref 3.5–5.2)
Sodium: 144 mmol/L (ref 134–144)

## 2017-07-18 LAB — CBC WITH DIFFERENTIAL/PLATELET
Basophils Absolute: 0 10*3/uL (ref 0.0–0.2)
Basos: 0 %
EOS (ABSOLUTE): 0.2 10*3/uL (ref 0.0–0.4)
Eos: 3 %
Hematocrit: 48.6 % (ref 37.5–51.0)
Hemoglobin: 16.7 g/dL (ref 13.0–17.7)
Immature Grans (Abs): 0 10*3/uL (ref 0.0–0.1)
Immature Granulocytes: 0 %
Lymphocytes Absolute: 1.6 10*3/uL (ref 0.7–3.1)
Lymphs: 17 %
MCH: 31.9 pg (ref 26.6–33.0)
MCHC: 34.4 g/dL (ref 31.5–35.7)
MCV: 93 fL (ref 79–97)
Monocytes Absolute: 0.6 10*3/uL (ref 0.1–0.9)
Monocytes: 6 %
Neutrophils Absolute: 6.9 10*3/uL (ref 1.4–7.0)
Neutrophils: 74 %
Platelets: 202 10*3/uL (ref 150–450)
RBC: 5.23 x10E6/uL (ref 4.14–5.80)
RDW: 14.4 % (ref 12.3–15.4)
WBC: 9.3 10*3/uL (ref 3.4–10.8)

## 2017-07-18 LAB — PSA: Prostate Specific Ag, Serum: 2.7 ng/mL (ref 0.0–4.0)

## 2017-07-19 ENCOUNTER — Telehealth: Payer: Self-pay | Admitting: Urology

## 2017-07-19 NOTE — Telephone Encounter (Signed)
Please let Richard Duncan know that his labs were norma and his PSA actually has come down in value from 3.60 to 2.7 on the 20th.  We will need to see him in three months for an office visit.  We will need PSA prior to his visit.

## 2017-07-21 NOTE — Telephone Encounter (Signed)
LMOM for patient to return call.

## 2017-07-25 NOTE — Telephone Encounter (Signed)
See my chart message

## 2017-08-07 ENCOUNTER — Telehealth: Payer: Self-pay

## 2017-08-07 DIAGNOSIS — N200 Calculus of kidney: Secondary | ICD-10-CM

## 2017-08-07 MED ORDER — POTASSIUM CITRATE ER 15 MEQ (1620 MG) PO TBCR: 1.0000 | EXTENDED_RELEASE_TABLET | Freq: Three times a day (TID) | ORAL | 0 refills | Status: DC

## 2017-08-07 NOTE — Telephone Encounter (Signed)
Incoming requests from pharmacy for potassium,pt with recently drawn potassium levels which are normal. RX given with no refills. Will inquire with provider about giving further refills.

## 2017-08-11 ENCOUNTER — Telehealth: Payer: Self-pay

## 2017-08-11 DIAGNOSIS — C61 Malignant neoplasm of prostate: Secondary | ICD-10-CM

## 2017-08-11 DIAGNOSIS — N200 Calculus of kidney: Secondary | ICD-10-CM

## 2017-08-11 MED ORDER — POTASSIUM CITRATE ER 15 MEQ (1620 MG) PO TBCR: 1.0000 | EXTENDED_RELEASE_TABLET | Freq: Three times a day (TID) | ORAL | 2 refills | Status: DC

## 2017-08-11 NOTE — Telephone Encounter (Signed)
-----   Message from Nori Riis, PA-C sent at 08/10/2017  2:17 PM EDT ----- Mr. Rohrman needs an appointment in September for a PSA and office visit.  It is fine to give refills on his Urocit -K until his appointment in September.

## 2017-08-20 ENCOUNTER — Encounter: Payer: Self-pay | Admitting: Emergency Medicine

## 2017-08-20 ENCOUNTER — Emergency Department: Payer: Medicare Other

## 2017-08-20 ENCOUNTER — Emergency Department
Admission: EM | Admit: 2017-08-20 | Discharge: 2017-08-21 | Disposition: A | Discharge status: Home / Self Care | Payer: Medicare Other | Attending: Emergency Medicine | Admitting: Emergency Medicine

## 2017-08-20 ENCOUNTER — Other Ambulatory Visit: Payer: Self-pay

## 2017-08-20 DIAGNOSIS — Z8546 Personal history of malignant neoplasm of prostate: Secondary | ICD-10-CM | POA: Diagnosis not present

## 2017-08-20 DIAGNOSIS — Z87891 Personal history of nicotine dependence: Secondary | ICD-10-CM | POA: Insufficient documentation

## 2017-08-20 DIAGNOSIS — I129 Hypertensive chronic kidney disease with stage 1 through stage 4 chronic kidney disease, or unspecified chronic kidney disease: Secondary | ICD-10-CM | POA: Diagnosis not present

## 2017-08-20 DIAGNOSIS — Z79899 Other long term (current) drug therapy: Secondary | ICD-10-CM | POA: Diagnosis not present

## 2017-08-20 DIAGNOSIS — N189 Chronic kidney disease, unspecified: Secondary | ICD-10-CM | POA: Diagnosis not present

## 2017-08-20 DIAGNOSIS — Z950 Presence of cardiac pacemaker: Secondary | ICD-10-CM | POA: Insufficient documentation

## 2017-08-20 DIAGNOSIS — Z96643 Presence of artificial hip joint, bilateral: Secondary | ICD-10-CM | POA: Insufficient documentation

## 2017-08-20 DIAGNOSIS — N132 Hydronephrosis with renal and ureteral calculous obstruction: Secondary | ICD-10-CM | POA: Insufficient documentation

## 2017-08-20 DIAGNOSIS — R109 Unspecified abdominal pain: Secondary | ICD-10-CM | POA: Diagnosis present

## 2017-08-20 LAB — CBC WITH DIFFERENTIAL/PLATELET
Basophils Absolute: 0.1 10*3/uL (ref 0–0.1)
Basophils Relative: 1 %
Eosinophils Absolute: 0.4 10*3/uL (ref 0–0.7)
Eosinophils Relative: 3 %
HCT: 46.8 % (ref 40.0–52.0)
Hemoglobin: 16.6 g/dL (ref 13.0–18.0)
Lymphocytes Relative: 16 %
Lymphs Abs: 2.1 10*3/uL (ref 1.0–3.6)
MCH: 32.5 pg (ref 26.0–34.0)
MCHC: 35.4 g/dL (ref 32.0–36.0)
MCV: 92 fL (ref 80.0–100.0)
Monocytes Absolute: 0.9 10*3/uL (ref 0.2–1.0)
Monocytes Relative: 7 %
Neutro Abs: 10.1 10*3/uL — ABNORMAL HIGH (ref 1.4–6.5)
Neutrophils Relative %: 75 %
Platelets: 225 10*3/uL (ref 150–440)
RBC: 5.09 MIL/uL (ref 4.40–5.90)
RDW: 13.3 % (ref 11.5–14.5)
WBC: 13.5 10*3/uL — ABNORMAL HIGH (ref 3.8–10.6)

## 2017-08-20 LAB — COMPREHENSIVE METABOLIC PANEL
ALT: 25 U/L (ref 0–44)
AST: 25 U/L (ref 15–41)
Albumin: 4 g/dL (ref 3.5–5.0)
Alkaline Phosphatase: 100 U/L (ref 38–126)
Anion gap: 7 (ref 5–15)
BUN: 30 mg/dL — ABNORMAL HIGH (ref 8–23)
CO2: 26 mmol/L (ref 22–32)
Calcium: 8.7 mg/dL — ABNORMAL LOW (ref 8.9–10.3)
Chloride: 108 mmol/L (ref 98–111)
Creatinine, Ser: 1.33 mg/dL — ABNORMAL HIGH (ref 0.61–1.24)
GFR calc Af Amer: >60 mL/min (ref >60)
GFR calc non Af Amer: 55 mL/min — ABNORMAL LOW (ref >60)
Glucose, Bld: 118 mg/dL — ABNORMAL HIGH (ref 70–99)
Potassium: 4.2 mmol/L (ref 3.5–5.1)
Sodium: 141 mmol/L (ref 135–145)
Total Bilirubin: 0.6 mg/dL (ref 0.3–1.2)
Total Protein: 7.4 g/dL (ref 6.5–8.1)

## 2017-08-20 LAB — URINALYSIS, COMPLETE (UACMP) WITH MICROSCOPIC
Bilirubin Urine: NEGATIVE
Glucose, UA: NEGATIVE mg/dL
Ketones, ur: NEGATIVE mg/dL
Leukocytes, UA: NEGATIVE
Nitrite: NEGATIVE
Protein, ur: 30 mg/dL — AB
RBC / HPF: >50 RBC/hpf — ABNORMAL HIGH (ref 0–5)
Specific Gravity, Urine: 1.014 (ref 1.005–1.030)
pH: 5 (ref 5.0–8.0)

## 2017-08-20 NOTE — ED Triage Notes (Signed)
Patient ambulatory to triage with steady gait, without difficulty or distress noted; pt reports right flank pain radiating into right abd/groin last several days accomp by hematuria; st hx kidney stones, surgically removed

## 2017-08-21 DIAGNOSIS — N132 Hydronephrosis with renal and ureteral calculous obstruction: Secondary | ICD-10-CM | POA: Diagnosis not present

## 2017-08-21 MED ORDER — ONDANSETRON 4 MG PO TBDP: ORAL_TABLET | ORAL | 0 refills | Status: DC

## 2017-08-21 MED ORDER — ONDANSETRON 4 MG PO TBDP
4.0000 mg | ORAL_TABLET | Freq: Once | ORAL | Status: AC
  Administered 2017-08-21: 4 mg via ORAL
  Filled 2017-08-21: qty 1

## 2017-08-21 MED ORDER — OXYCODONE-ACETAMINOPHEN 5-325 MG PO TABS: 1.0000 | ORAL_TABLET | Freq: Four times a day (QID) | ORAL | 0 refills | Status: DC | PRN

## 2017-08-21 MED ORDER — KETOROLAC TROMETHAMINE 30 MG/ML IJ SOLN
15.0000 mg | Freq: Once | INTRAMUSCULAR | Status: AC
  Administered 2017-08-21: 15 mg via INTRAMUSCULAR
  Filled 2017-08-21: qty 1

## 2017-08-21 MED ORDER — CEPHALEXIN 500 MG PO CAPS: 500.0000 mg | ORAL_CAPSULE | Freq: Two times a day (BID) | ORAL | 0 refills | Status: DC

## 2017-08-21 MED ORDER — TAMSULOSIN HCL 0.4 MG PO CAPS: ORAL_CAPSULE | ORAL | 0 refills | Status: DC

## 2017-08-21 MED ORDER — CEPHALEXIN 500 MG PO CAPS
500.0000 mg | ORAL_CAPSULE | Freq: Once | ORAL | Status: AC
  Administered 2017-08-21: 500 mg via ORAL
  Filled 2017-08-21: qty 1

## 2017-08-21 MED ORDER — ACETAMINOPHEN 500 MG PO TABS
1000.0000 mg | ORAL_TABLET | Freq: Once | ORAL | Status: AC
  Administered 2017-08-21: 1000 mg via ORAL
  Filled 2017-08-21: qty 2

## 2017-08-21 NOTE — Discharge Instructions (Signed)
You have been seen in the Emergency Department (ED) today for pain that we believe based on your workup, is caused by kidney stones.  As we have discussed, please drink plenty of fluids.  Please make a follow up appointment with the physician(s) listed elsewhere in this documentation. ° °You may take pain medication as needed but ONLY as prescribed.  Please also take your prescribed Flomax daily.  We also recommend that you take over-the-counter ibuprofen regularly according to label instructions over the next 5 days.  Take it with meals to minimize stomach discomfort. ° °Please see your doctor as soon as possible as stones may take 1-3 weeks to pass and you may require additional care or medications. ° °Do not drink alcohol, drive or participate in any other potentially dangerous activities while taking opiate pain medication as it may make you sleepy. Do not take this medication with any other sedating medications, either prescription or over-the-counter. If you were prescribed Percocet or Vicodin, do not take these with acetaminophen (Tylenol) as it is already contained within these medications. °  °Take Percocet as needed for severe pain.  This medication is an opiate (or narcotic) pain medication and can be habit forming.  Use it as little as possible to achieve adequate pain control.  Do not use or use it with extreme caution if you have a history of opiate abuse or dependence.  If you are on a pain contract with your primary care doctor or a pain specialist, be sure to let them know you were prescribed this medication today from the [facility].  This medication is intended for your use only - do not give any to anyone else and keep it in a secure place where nobody else, especially children, have access to it.  It will also cause or worsen constipation, so you may want to consider taking an over-the-counter stool softener while you are taking this medication. ° °Return to the  Emergency Department (ED) or call your doctor if you have any worsening pain, fever, painful urination, are unable to urinate, or develop other symptoms that concern you. ° °

## 2017-08-21 NOTE — ED Provider Notes (Signed)
[facility] Emergency Department Provider Note  ____________________________________________   First MD Initiated Contact with Patient 08/20/17 2343     (approximate)  I have reviewed the triage vital signs and the nursing notes.   HISTORY  Chief Complaint Flank Pain    HPI Richard Duncan is a 67 y.o. male with medical history as listed below which notably includes recurrent episodes of kidney stones, several of which have required urological surgery.  He presents for evaluation of a couple of days of nausea and acute onset right flank pain today that is severe, sharp, stabbing, and moderate to severe in intensity.  It feels similar to prior kidney stones.  He denies fever/chills, chest pain, shortness of breath, vomiting, and other abdominal pain.  He has moderate nausea.  He has had dark-colored urine but no obvious blood.  He is urinating well.  He drinks lots of water every day to stay hydrated and to help with passing the stones.  He reports that he passes multiple stones frequently and this is the first time in about a year that he has had to come to the emergency department for the symptoms.  He reports that the pain has improved now and he is in no acute distress.  Past Medical History:  Diagnosis Date  . Anemia   . Anxiety   . 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 Bilateral carpal tunnel syndrome   . Cancer [facility]) 2011   Prostate Ca  . Carpal tunnel syndrome   . Chronic kidney disease   . Complication of anesthesia    urinary retention with Popanol  . Depression   . Difficult intubation   . Dyspnea    RECENT-SUPPOSED TO HAVE STRESS TEST 09-26-16 WITH DR Ubaldo Glassing BUT CANCELLED DUE TO STENT PLACEMENT ON 09-27-16  . ED (erectile dysfunction)   . GERD (gastroesophageal reflux disease)    rare  . Hernia, hiatal   . History of blood product transfusion   . History of hiatal hernia   . History of kidney stones   . Hyperlipemia   . Hypertension   . Kidney stone    . Neuropathy   . Neuropathy   . Obesity   . Over weight   . Presence of permanent cardiac pacemaker    Medtronic  . Prostatitis   . PTSD (post-traumatic stress disorder)   . Sleep apnea    OSA--use Bi-PAP  . Urinary retention     Patient Active Problem List   Diagnosis Date Noted  . Spondylosis of lumbar region without myelopathy or radiculopathy 01/20/2017  . Neck pain 01/20/2017  . Numbness and tingling 09/20/2016  . Cervical spondylosis 08/07/2016  . Numbness of upper extremity (Bilateral) 07/30/2016  . Chronic cervical radiculopathy (Bilateral) 07/30/2016  . Carpal tunnel syndrome (Bilateral) 07/30/2016  . Cervical facet syndrome (Bilateral) 07/30/2016  . Chronic shoulder pain (Bilateral) (L>R) 06/18/2016  . Cervicogenic headache (Bilateral) (L>R) 06/18/2016  . Lumbar lateral recess stenosis (Bilateral: L2-3, L3-4, and L4-5) 06/18/2016  . Lumbar facet syndrome (Bilateral) (R>L) 06/18/2016  . 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 Neurogenic pain 06/18/2016  . Neuropathic pain 06/18/2016  . Thoracic facet arthropathy (Bilateral: T7-8, T8-9, and T9-10) 06/18/2016  . DDD (degenerative disc disease), lumbar 06/18/2016  . DDD (degenerative disc disease), cervical 06/18/2016  . DDD (degenerative disc disease), thoracic 06/18/2016  . Neuropathy 04/16/2016  . Elevated C-reactive protein (CRP) 04/10/2016  . Chronic pain syndrome 03/11/2016  . Chronic prescription  benzodiazepine use 03/11/2016  . Lower extremity neuropathy (Bilateral) 03/11/2016  . Chronic hip pain (Primary Source of Pain) (Bilateral) (R>L) 03/11/2016  . 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 Chronic low back pain (Secondary source of pain) (Bilateral) (R>L) 03/11/2016  . Chronic neck pain [facility] source of pain) (Midline) (Bilateral) 03/11/2016  . Disturbance of skin sensation 03/11/2016  . Lumbar facet arthropathy (Bilateral: L12,  L2-3, L3-4, L4-5, L5-S1) 03/11/2016  . Lumbar foraminal stenosis (Bilateral: L2-3, L3-4, and L4-5) 03/11/2016  . Thoracic foraminal stenosis (Right: T8-9 and T9-10) 03/11/2016  . Lumbar spondylosis 03/11/2016  . Osteoarthritis 03/11/2016  . Thoracic spondylosis 03/11/2016  . Medtronic spinal cord stimulator 03/11/2016  . Acquired heart block 12/07/2015  . 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 Lower urinary tract symptoms 09/13/2014  . Anxiety 06/18/2014  . Cardiac disease 06/18/2014  . Hyperlipidemia, unspecified 06/18/2014  . BP (high blood pressure) 06/18/2014  . Prostate cancer ([place]) 06/18/2014  . ED (erectile dysfunction) 06/18/2014  . Prostatitis 06/18/2014  . Obesity 06/18/2014  . Urinary retention 06/18/2014  . Mechanical complication of prosthetic joint implant ([place]) 07/12/2013  . 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 APPENDECTOMY    . [place], 2006   Lumbar Spine  . BACK SURGERY     x2 Laminectomy  . CARDIAC PACEMAKER PLACEMENT    . CERVICAL FUSION    . CERVICAL SPINE SURGERY N/A 2008  . COLONOSCOPY    . CYSTOSCOPY W/ URETERAL STENT PLACEMENT Bilateral 11/01/2016   Procedure: CYSTOSCOPY WITH STENT REPLACEMENT;  Surgeon: [name], MD;  Location: [facility];  Service: Urology;  Laterality: Bilateral;  . CYSTOSCOPY W/ URETERAL STENT PLACEMENT Left 12/10/2016   Procedure: CYSTOSCOPY WITH STENT REPLACEMENT;  Surgeon: Abbie Sons, MD;  Location: [facility];  Service: Urology;  Laterality: Left;  . CYSTOSCOPY W/ URETERAL STENT REMOVAL Bilateral 11/27/2016   Procedure: CYSTOSCOPY WITH STENT REMOVAL/EXCHANGE;  Surgeon: [name], MD;  Location: [facility];  Service: Urology;  Laterality: Bilateral;  . CYSTOSCOPY WITH STENT PLACEMENT Bilateral 09/27/2016   Procedure: CYSTOSCOPY WITH STENT PLACEMENT;  Surgeon: [name], MD;  Location:  [facility];  Service: Urology;  Laterality: Bilateral;  . EXCISION OF ABDOMINAL WALL TUMOR N/A 03/08/2016   Procedure: EXCISION OF ABDOMINAL WALL MASS;  Surgeon: Leonie Green, MD;  Location: [facility];  Service: General;  Laterality: N/A;  . JOINT REPLACEMENT  2004, 2005, 2014, 2015   Bilat Hip Replacements  . SPINAL CORD STIMULATOR INSERTION N/A 07/08/2014   Procedure: LUMBAR SPINAL CORD STIMULATOR INSERTION;  Surgeon: Clydell Hakim, MD;  Location: [place];  Service: Neurosurgery;  Laterality: N/A;  LUMBAR SPINAL CORD STIMULATOR INSERTION  . TONSILLECTOMY    . URETEROSCOPY WITH HOLMIUM LASER LITHOTRIPSY Bilateral 11/01/2016   Procedure: URETEROSCOPY WITH HOLMIUM LASER LITHOTRIPSY;  Surgeon: [name], MD;  Location: [facility];  Service: Urology;  Laterality: Bilateral;  . URETEROSCOPY WITH HOLMIUM LASER LITHOTRIPSY Left 12/10/2016   Procedure: URETEROSCOPY WITH HOLMIUM LASER LITHOTRIPSY;  Surgeon: Abbie Sons, MD;  Location: [facility];  Service: Urology;  Laterality: Left;  . URETEROSCOPY WITH HOLMIUM LASER LITHOTRIPSY Left 11/27/2016   Procedure: URETEROSCOPY WITH HOLMIUM LASER LITHOTRIPSY;  Surgeon: [name], MD;  Location: [facility];  Service: Urology;  Laterality: Left;    Prior to Admission medications   Medication Sig Start Date End Date Taking? Authorizing  Provider  amLODipine (NORVASC) 10 MG tablet Take 10 mg by mouth every morning.  06/08/14   [provider]  benazepril (LOTENSIN) 20 MG tablet Take 20 mg by mouth every morning.  06/08/14   [provider]  cephALEXin (KEFLEX) 500 MG capsule Take 1 capsule (500 mg total) by mouth 2 (two) times daily. 08/21/17   Hinda Kehr, MD  Cholecalciferol (VITAMIN D3) 2000 UNITS capsule Take 2,000 Units by mouth daily.     [provider]  citalopram (CELEXA) 40 MG tablet  03/31/17   [provider]  hydrALAZINE (APRESOLINE) 25 MG tablet Take 25 mg by mouth 2 (two) times daily.    [provider]  metoprolol tartrate (LOPRESSOR) 25 MG tablet Take 25 mg by mouth 2 (two) times daily.  08/05/16   [provider]  ondansetron (ZOFRAN ODT) 4 MG disintegrating tablet Allow 1-2 tablets to dissolve in your mouth every 8 hours as needed for nausea/vomiting 08/21/17   Hinda Kehr, MD  oxyCODONE-acetaminophen (PERCOCET) 5-325 MG tablet Take 1-2 tablets by mouth every 6 (six) hours as needed for severe pain. 08/21/17   Hinda Kehr, MD  Potassium Citrate (UROCIT-K 15) 15 MEQ (1620 MG) TBCR Take 1 tablet by mouth 3 (three) times daily. 08/11/17   Zara Council A, PA-C  sildenafil (REVATIO) 20 MG tablet TAKE 2 TO 5 TABLETS 1/2 TO 1 HOUR PRIOR TO ACTIVITY 06/24/17   Zara Council A, PA-C  tamsulosin (FLOMAX) 0.4 MG CAPS capsule Take 1 tablet by mouth daily until you pass the kidney stone or no longer have symptoms 08/21/17   Hinda Kehr, MD  vitamin B-12 (CYANOCOBALAMIN) 500 MCG tablet Take 500 mcg daily by mouth.    [provider]    Allergies Propofol and Propranolol  Family History  Problem Relation Age of Onset  . Benign prostatic hyperplasia Father   . Prostate cancer Father   . Hypertension Mother   . Kidney disease Neg Hx   . Kidney cancer Neg Hx   . Bladder Cancer Neg Hx     Social History Social History   Tobacco Use  . Smoking status: Former Smoker    Years: 2.00    Types: Cigarettes    Last attempt to quit: 06/18/1966    Years since quitting: 51.2  . Smokeless tobacco: Never Used  . Tobacco comment: quit 40 years ago  Substance Use Topics  . Alcohol use: Yes    Alcohol/week: 0.0 oz    Comment: Occasional  . Drug use: No    Review of Systems Constitutional: No fever/chills Eyes: No visual changes. ENT: No sore throat. Cardiovascular: Denies chest pain. Respiratory: Denies shortness of breath. Gastrointestinal: Right-sided flank pain.  No abdominal pain.  Nausea, no vomiting.  No diarrhea.  No constipation. Genitourinary:  Dark-colored urine.  Negative for dysuria. Musculoskeletal: Negative for neck pain.  Negative for back pain. Integumentary: Negative for rash. Neurological: Negative for headaches, focal weakness or numbness.   ____________________________________________   PHYSICAL EXAM:  VITAL SIGNS: ED Triage Vitals  Enc Vitals Group     BP 08/20/17 2310 (!) 181/79     Pulse Rate 08/20/17 2310 86     Resp 08/20/17 2310 18     Temp 08/20/17 2310 97.8 F (36.6 C)     Temp Source 08/20/17 2310 Oral     SpO2 08/20/17 2310 98 %     Weight 08/20/17 2312 (!) 169.2 kg (373 lb)     Height 08/20/17 2312 1.854  m (6\' 1" )     Head Circumference --      Peak Flow --      Pain Score 08/20/17 2324 7     Pain Loc --      Pain Edu? --      Excl. in [place]? --     Constitutional: Alert and oriented. Well appearing and in no acute distress. Eyes: Conjunctivae are normal.  Head: Atraumatic. Nose: No congestion/rhinnorhea. Mouth/Throat: Mucous membranes are moist. Neck: No stridor.  No meningeal signs.   Cardiovascular: Normal rate, regular rhythm. Good peripheral circulation. Grossly normal heart sounds. Respiratory: Normal respiratory effort.  No retractions. Lungs CTAB. Gastrointestinal: Soft and nontender. No distention.  Musculoskeletal: Mild right CVA tenderness to percussion.  No lower extremity tenderness nor edema. No gross deformities of extremities. Neurologic:  Normal speech and language. No gross focal neurologic deficits are appreciated.  Skin:  Skin is warm, dry and intact. No rash noted. Psychiatric: Mood and affect are normal. Speech and behavior are normal.  ____________________________________________   LABS (all labs ordered are listed, but only abnormal results are displayed)  Labs Reviewed  URINALYSIS, COMPLETE (UACMP) WITH MICROSCOPIC - Abnormal; Notable for the following components:      Result Value   Color, Urine YELLOW (*)    APPearance CLOUDY (*)    Hgb urine dipstick LARGE  (*)    Protein, ur 30 (*)    RBC / HPF >50 (*)    Bacteria, UA RARE (*)    All other components within normal limits  CBC WITH DIFFERENTIAL/PLATELET - Abnormal; Notable for the following components:   WBC 13.5 (*)    Neutro Abs 10.1 (*)    All other components within normal limits  COMPREHENSIVE METABOLIC PANEL - Abnormal; Notable for the following components:   Glucose, Bld 118 (*)    BUN 30 (*)    Creatinine, Ser 1.33 (*)    Calcium 8.7 (*)    GFR calc non Af Amer 55 (*)    All other components within normal limits  URINE CULTURE   ____________________________________________  EKG  None - EKG not ordered by ED physician ____________________________________________  RADIOLOGY   ED MD interpretation: Obstructive distal ureteral stone or stones on the right  With hydronephrosis.  Official radiology report(s): Ct Renal Stone Study  Result Date: 08/21/2017 CLINICAL DATA:  Right flank pain radiating to the groin for the last several days. Hematuria. Previous history of kidney stones. EXAM: CT ABDOMEN AND PELVIS WITHOUT CONTRAST TECHNIQUE: Multidetector CT imaging of the abdomen and pelvis was performed following the standard protocol without IV contrast. COMPARISON:  03/26/2017 FINDINGS: Lower chest: Lung bases are clear. Hepatobiliary: No focal liver abnormality is seen. No gallstones, gallbladder wall thickening, or biliary dilatation. Pancreas: Unremarkable. No pancreatic ductal dilatation or surrounding inflammatory changes. Spleen: Normal in size without focal abnormality. Adrenals/Urinary Tract: 1.5 cm diameter left adrenal gland nodule with low-attenuation Hounsfield measurements consistent with adenoma. No change since prior study. Multiple bilateral renal calcifications. Largest is in the right lower pole measuring 12 mm diameter. Moderate right hydronephrosis and hydroureter. There is a 4 mm stone in the distal right ureter just above the ureterovesical junction and a probable  3 mm stone in the distal right ureter at the ureterovesical junction. No bladder stones are demonstrated. Bladder wall is not thickened. Prostate impression on the bladder base. Stomach/Bowel: Stomach, small bowel, and colon are mostly decompressed with scattered stool in the colon. No wall thickening or inflammatory changes are  appreciated. Appendix is not identified. Vascular/Lymphatic: Minimal calcification of the aorta. No aneurysm. No significant lymphadenopathy. Reproductive: Prostate gland is diffusely enlarged, measuring 5.9 cm diameter. Other: No free air or free fluid in the abdomen. Abdominal wall musculature appears intact. Musculoskeletal: Postoperative changes in both hips. Degenerative changes in the lumbar spine. Spinal stimulator lead tips over the lower thoracic region. Generator pack in the soft tissues posterior to the right lower abdomen. IMPRESSION: 1. 4 mm stone in the distal right ureter above the ureterovesical junction with moderate proximal obstruction. Additional 3 mm stone in the distal right ureter at the ureterovesical junction. 2. Multiple bilateral nonobstructing intrarenal calcifications. 3. Left adrenal gland adenoma, unchanged. 4. Enlarged prostate gland. 5. Mild aortic atherosclerosis. Electronically Signed   By: Lucienne Capers M.D.   On: 08/21/2017 00:01    ____________________________________________   PROCEDURES  Critical Care performed: No   Procedure(s) performed:   Procedures   ____________________________________________   INITIAL IMPRESSION / ASSESSMENT AND PLAN / ED COURSE  As part of my medical decision making, I reviewed the following data within the [place] notes reviewed and incorporated, Labs reviewed , Old chart reviewed, Notes from prior ED visits and [place] Controlled Substance Database    Differential diagnosis includes, but is not limited to, ureteral colic, UTI/pyelonephritis or infected stone, renal  infarction, less likely ACS or musculoskeletal pain.  The patient's pain has improved since coming to the emergency department and he is in no distress at this point.  His labs are generally reassuring with a creatinine just slightly elevated from baseline but the patient is tolerating oral fluids and says he does a good job of staying hydrated.  No evidence that indicates he needs other acute intervention at this time.  He is comfortable with the plan for oral analgesia and antiemetics and close outpatient follow-up with urology with whom he is already an established patient.  Given the possibility of a UTI I am starting him on Keflex just to be safe.  He is getting a first dose of Keflex 500 mg by mouth, Toradol 15 mg intramuscular, and Zofran 4 mg ODT prior to discharge.  I gave my usual and customary return precautions.     ____________________________________________  FINAL CLINICAL IMPRESSION(S) / ED DIAGNOSES  Final diagnoses:  Ureteral stone with hydronephrosis     MEDICATIONS GIVEN DURING THIS VISIT:  Medications  ondansetron (ZOFRAN-ODT) disintegrating tablet 4 mg (4 mg Oral Given 08/21/17 0100)  ketorolac (TORADOL) 30 MG/ML injection 15 mg (15 mg Intramuscular Given 08/21/17 0100)  acetaminophen (TYLENOL) tablet 1,000 mg (1,000 mg Oral Given 08/21/17 0100)  cephALEXin (KEFLEX) capsule 500 mg (500 mg Oral Given 08/21/17 0100)     ED Discharge Orders        Ordered    cephALEXin (KEFLEX) 500 MG capsule  2 times daily,   Status:  Discontinued     08/21/17 0055    tamsulosin (FLOMAX) 0.4 MG CAPS capsule  Status:  Discontinued     08/21/17 0055    oxyCODONE-acetaminophen (PERCOCET) 5-325 MG tablet  Every 6 hours PRN,   Status:  Discontinued     08/21/17 0055    ondansetron (ZOFRAN ODT) 4 MG disintegrating tablet  Status:  Discontinued     08/21/17 0055    cephALEXin (KEFLEX) 500 MG capsule  2 times daily     08/21/17 0100    ondansetron (ZOFRAN ODT) 4 MG disintegrating tablet       08/21/17 0100  oxyCODONE-acetaminophen (PERCOCET) 5-325 MG tablet  Every 6 hours PRN     08/21/17 0100    tamsulosin (FLOMAX) 0.4 MG CAPS capsule     08/21/17 0100       Note:  This document was prepared using Dragon voice recognition software and may include unintentional dictation errors.    Hinda Kehr, MD 08/21/17 [phone]

## 2017-08-22 LAB — URINE CULTURE
Culture: NO GROWTH
Special Requests: NORMAL

## 2017-09-17 ENCOUNTER — Other Ambulatory Visit: Payer: Self-pay | Admitting: Urology

## 2017-09-17 DIAGNOSIS — N200 Calculus of kidney: Secondary | ICD-10-CM

## 2017-11-21 ENCOUNTER — Ambulatory Visit: Payer: Medicare Other | Admitting: Urology

## 2017-11-24 ENCOUNTER — Ambulatory Visit (INDEPENDENT_AMBULATORY_CARE_PROVIDER_SITE_OTHER): Payer: Medicare Other | Admitting: Urology

## 2017-11-24 ENCOUNTER — Telehealth: Payer: Self-pay

## 2017-11-24 ENCOUNTER — Other Ambulatory Visit: Payer: Self-pay | Admitting: Urology

## 2017-11-24 ENCOUNTER — Other Ambulatory Visit
Admission: RE | Admit: 2017-11-24 | Discharge: 2017-11-24 | Disposition: A | Discharge status: Home / Self Care | Payer: Medicare Other | Source: Ambulatory Visit | Attending: Student | Admitting: Student

## 2017-11-24 ENCOUNTER — Encounter: Payer: Self-pay | Admitting: Urology

## 2017-11-24 VITALS — BP 143/85 | HR 85 | Ht 73.0 in | Wt 374.0 lb

## 2017-11-24 DIAGNOSIS — N529 Male erectile dysfunction, unspecified: Secondary | ICD-10-CM

## 2017-11-24 DIAGNOSIS — N138 Other obstructive and reflux uropathy: Secondary | ICD-10-CM

## 2017-11-24 DIAGNOSIS — N401 Enlarged prostate with lower urinary tract symptoms: Secondary | ICD-10-CM | POA: Diagnosis present

## 2017-11-24 DIAGNOSIS — R350 Frequency of micturition: Secondary | ICD-10-CM

## 2017-11-24 DIAGNOSIS — N200 Calculus of kidney: Secondary | ICD-10-CM | POA: Diagnosis present

## 2017-11-24 DIAGNOSIS — Z8546 Personal history of malignant neoplasm of prostate: Secondary | ICD-10-CM

## 2017-11-24 LAB — CBC WITH DIFFERENTIAL/PLATELET
Abs Immature Granulocytes: 0.07 10*3/uL (ref 0.00–0.07)
Basophils Absolute: 0.1 10*3/uL (ref 0.0–0.1)
Basophils Relative: 0 %
Eosinophils Absolute: 0.3 10*3/uL (ref 0.0–0.5)
Eosinophils Relative: 2 %
HCT: 49.7 % (ref 39.0–52.0)
Hemoglobin: 16.8 g/dL (ref 13.0–17.0)
Immature Granulocytes: 1 %
Lymphocytes Relative: 11 %
Lymphs Abs: 1.3 10*3/uL (ref 0.7–4.0)
MCH: 31.3 pg (ref 26.0–34.0)
MCHC: 33.8 g/dL (ref 30.0–36.0)
MCV: 92.6 fL (ref 80.0–100.0)
Monocytes Absolute: 0.7 10*3/uL (ref 0.1–1.0)
Monocytes Relative: 6 %
Neutro Abs: 10 10*3/uL — ABNORMAL HIGH (ref 1.7–7.7)
Neutrophils Relative %: 80 %
Platelets: 211 10*3/uL (ref 150–400)
RBC: 5.37 MIL/uL (ref 4.22–5.81)
RDW: 13.6 % (ref 11.5–15.5)
WBC: 12.4 10*3/uL — ABNORMAL HIGH (ref 4.0–10.5)
nRBC: 0 % (ref 0.0–0.2)

## 2017-11-24 LAB — URINALYSIS, COMPLETE (UACMP) WITH MICROSCOPIC
Bacteria, UA: NONE SEEN
Bilirubin Urine: NEGATIVE
Glucose, UA: NEGATIVE mg/dL
Ketones, ur: NEGATIVE mg/dL
Leukocytes, UA: NEGATIVE
Nitrite: NEGATIVE
Protein, ur: 30 mg/dL — AB
Specific Gravity, Urine: 1.025 (ref 1.005–1.030)
pH: 5.5 (ref 5.0–8.0)

## 2017-11-24 LAB — BASIC METABOLIC PANEL
Anion gap: 10 (ref 5–15)
BUN: 23 mg/dL (ref 8–23)
CO2: 27 mmol/L (ref 22–32)
Calcium: 9 mg/dL (ref 8.9–10.3)
Chloride: 104 mmol/L (ref 98–111)
Creatinine, Ser: 1.16 mg/dL (ref 0.61–1.24)
GFR calc Af Amer: >60 mL/min (ref >60)
GFR calc non Af Amer: >60 mL/min (ref >60)
Glucose, Bld: 107 mg/dL — ABNORMAL HIGH (ref 70–99)
Potassium: 4.1 mmol/L (ref 3.5–5.1)
Sodium: 141 mmol/L (ref 135–145)

## 2017-11-24 LAB — PSA: Prostatic Specific Antigen: 3.1 ng/mL (ref 0.00–4.00)

## 2017-11-24 MED ORDER — SILDENAFIL CITRATE 20 MG PO TABS: ORAL_TABLET | ORAL | 3 refills | Status: DC

## 2017-11-24 NOTE — Telephone Encounter (Signed)
-----   Message from Nori Riis, PA-C sent at 11/24/2017 12:43 PM EDT ----- Would you call the lab in [place] and add an urine culture?

## 2017-11-24 NOTE — Progress Notes (Signed)
10:18 AM   DARREK Duncan [birth year] [id number]  Referring provider: Sofie Hartigan, MD Richard Duncan [place], [place] [address]  Chief Complaint  Patient presents with  . Follow-up    HPI: [name] is a 67 year old Caucasian male with prostate cancer, BPH with LU TS, erectile dysfunction and nephrolithiasis who presents today for a 6 month follow-up.  Prostate cancer Patient first underwent TRUSPBx of prostate in 2010 for a PSA of 4 and found to have a Gleason's 6. 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  His most recent PSA at the time of this visit was 2.7 ng/mL in 06/2017.      BPH WITH LUTS: His IPSS score today is 33, which is severe lower urinary tract symptomatology. He is unhappy with his quality life due to his urinary symptoms. His previous IPSS score was 19/3.  He has no major complaints today.  He denies any dysuria, hematuria or suprapubic pain.  Patient found good symptom control on Cialis 5 mg daily, but he found the prescription cost prohibitive and had to discontinue the medication.  He did not find tamsulosin effective.  He also denies any recent fevers, chills, nausea or vomiting.  He has a family history of PCa, with his father being diagnosed with PCa.  [place] Name 11/24/17 0900         International Prostate Symptom Score   How often have you had the sensation of not emptying your bladder?  More than half the time     How often have you had to urinate less than every two hours?  Almost always     How often have you found you stopped and started again several times when you urinated?  Almost always     How often have you found it difficult to postpone urination?  Almost always     How often have you had a weak urinary stream?  More than half the time     How often have you had to strain to start urination?  Almost always     How many times  did you typically get up at night to urinate?  5 Times     Total IPSS Score  33       Quality of Life due to urinary symptoms   If you were to spend the rest of your life with your urinary condition just the way it is now how would you feel about that?  Unhappy        Score:  1-7 Mild 8-19 Moderate 20-35 Severe  Erectile dysfunction His SHIM score is 15, which is mild to moderate ED.   His previous SHIM score was 9.  He has been having difficulty with erections for several years   His risk factors for ED are age, BPH, prostate cancer and HTN   He denies any painful erections or curvatures with his erections.   He is no longer having spontaneous erections.  He has tried PDE5i and a vacuum erect aid device in the past.   SHIM    [place] Name 11/24/17 0927         SHIM: Over the last 6 months:   How do you rate your confidence that you could get and keep an erection?  Low     When you had erections with sexual stimulation, how often  were your erections hard enough for penetration (entering your partner)?  Most Times (much more than half the time)     During sexual intercourse, how often were you able to maintain your erection after you had penetrated (entered) your partner?  Sometimes (about half the time)     During sexual intercourse, how difficult was it to maintain your erection to completion of intercourse?  Difficult     When you attempted sexual intercourse, how often was it satisfactory for you?  Sometimes (about half the time)       SHIM Total Score   SHIM  15        Score: 1-7 Severe ED 8-11 Moderate ED 12-16 Mild-Moderate ED 17-21 Mild ED 22-25 No ED  History of nephrolithiasis Patient underwent staged procedures for bilateral ureteral stones which was completed in 11/2016.  Contrast CT performed in 02/2017 noted no hydronephrosis.  Seen in the ED in 07/2017 - CT Renal stone study 08/20/2017 revealed 1.5 cm diameter left adrenal gland nodule with low-attenuation Hounsfield  measurements consistent with adenoma. No change since prior study. Multiple bilateral renal calcifications. Largest is in the right lower pole measuring 12 mm diameter. Moderate right hydronephrosis and hydroureter. There is a 4 mm stone in the distal right ureter just above the ureterovesical junction and a probable 3 mm stone in the distal right ureter at the ureterovesical junction. No bladder stones are demonstrated. Bladder wall is not thickened. Prostate impression on the bladder base.  PMH: Past Medical History:  Diagnosis Date  . Anemia   . Anxiety   . 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 Bilateral carpal tunnel syndrome   . Cancer [facility]) 2011   Prostate Ca  . Carpal tunnel syndrome   . Chronic kidney disease   . Complication of anesthesia    urinary retention with Popanol  . Depression   . Difficult intubation   . Dyspnea    RECENT-SUPPOSED TO HAVE STRESS TEST 09-26-16 WITH DR Ubaldo Glassing BUT CANCELLED DUE TO STENT PLACEMENT ON 09-27-16  . ED (erectile dysfunction)   . GERD (gastroesophageal reflux disease)    rare  . Hernia, hiatal   . History of blood product transfusion   . History of hiatal hernia   . History of kidney stones   . Hyperlipemia   . Hypertension   . Kidney stone   . Neuropathy   . Neuropathy   . Obesity   . Over weight   . Presence of permanent cardiac pacemaker    [employer]  . Prostatitis   . PTSD (post-traumatic stress disorder)   . Sleep apnea    OSA--use Bi-PAP  . Urinary retention     Surgical History: Past Surgical History:  Procedure Laterality Date  . APPENDECTOMY    . [place], 2006   Lumbar Spine  . BACK SURGERY     x2 Laminectomy  . CARDIAC PACEMAKER PLACEMENT    . CERVICAL FUSION    . CERVICAL SPINE SURGERY N/A 2008  . COLONOSCOPY    . CYSTOSCOPY W/ URETERAL STENT PLACEMENT Bilateral 11/01/2016   Procedure: CYSTOSCOPY WITH STENT REPLACEMENT;  Surgeon: [name], MD;  Location: [facility];  Service: Urology;  Laterality:  Bilateral;  . CYSTOSCOPY W/ URETERAL STENT PLACEMENT Left 12/10/2016   Procedure: CYSTOSCOPY WITH STENT REPLACEMENT;  Surgeon: Abbie Sons, MD;  Location: [facility];  Service: Urology;  Laterality: Left;  . CYSTOSCOPY W/ URETERAL STENT REMOVAL Bilateral 11/27/2016   Procedure: CYSTOSCOPY WITH STENT REMOVAL/EXCHANGE;  Surgeon: [name], MD;  Location: [facility];  Service: Urology;  Laterality: Bilateral;  . CYSTOSCOPY WITH STENT PLACEMENT Bilateral 09/27/2016   Procedure: CYSTOSCOPY WITH STENT PLACEMENT;  Surgeon: [name], MD;  Location: [facility];  Service: Urology;  Laterality: Bilateral;  . EXCISION OF ABDOMINAL WALL TUMOR N/A 03/08/2016   Procedure: EXCISION OF ABDOMINAL WALL MASS;  Surgeon: Leonie Green, MD;  Location: [facility];  Service: General;  Laterality: N/A;  . JOINT REPLACEMENT  2004, 2005, 2014, 2015   Bilat Hip Replacements  . SPINAL CORD STIMULATOR INSERTION N/A 07/08/2014   Procedure: LUMBAR SPINAL CORD STIMULATOR INSERTION;  Surgeon: Clydell Hakim, MD;  Location: [place];  Service: Neurosurgery;  Laterality: N/A;  LUMBAR SPINAL CORD STIMULATOR INSERTION  . TONSILLECTOMY    . URETEROSCOPY WITH HOLMIUM LASER LITHOTRIPSY Bilateral 11/01/2016   Procedure: URETEROSCOPY WITH HOLMIUM LASER LITHOTRIPSY;  Surgeon: [name], MD;  Location: [facility];  Service: Urology;  Laterality: Bilateral;  . URETEROSCOPY WITH HOLMIUM LASER LITHOTRIPSY Left 12/10/2016   Procedure: URETEROSCOPY WITH HOLMIUM LASER LITHOTRIPSY;  Surgeon: Abbie Sons, MD;  Location: [facility];  Service: Urology;  Laterality: Left;  . URETEROSCOPY WITH HOLMIUM LASER LITHOTRIPSY Left 11/27/2016   Procedure: URETEROSCOPY WITH HOLMIUM LASER LITHOTRIPSY;  Surgeon: [name], MD;  Location: [facility];  Service: Urology;  Laterality: Left;    Home Medications:  Allergies as of 11/24/2017      Reactions   Propofol Other (See Comments)   URINARY RETENTION REQUIRING CATHETERIZATION    Propranolol Other (See Comments)   Urinary retention Urinary retention      Medication List        Accurate as of 11/24/17 10:18 AM. Always use your most recent med list.          albuterol 108 (90 Base) MCG/ACT inhaler Commonly known as:  PROVENTIL HFA;VENTOLIN HFA Inhale into the lungs.   amLODipine 10 MG tablet Commonly known as:  NORVASC Take 10 mg by mouth every morning.   atenolol 100 MG tablet Commonly known as:  TENORMIN Take by mouth.   benazepril 20 MG tablet Commonly known as:  LOTENSIN Take 20 mg by mouth every morning.   Buprenorphine 15 MCG/HR Ptwk PLACE 1 PATCH ON THE SKIN EVERY 7 DAYS   citalopram 40 MG tablet Commonly known as:  CELEXA   Fluticasone-Salmeterol 250-50 MCG/DOSE Aepb Commonly known as:  ADVAIR Inhale into the lungs.   hydrALAZINE 25 MG tablet Commonly known as:  APRESOLINE Take 25 mg by mouth 2 (two) times daily.   metoprolol tartrate 25 MG tablet Commonly known as:  LOPRESSOR Take 25 mg by mouth 2 (two) times daily.   Potassium Citrate 15 MEQ (1620 MG) Tbcr Take 1 tablet by mouth 3 (three) times daily.   sildenafil 20 MG tablet Commonly known as:  REVATIO TAKE 2 TO 5 TABLETS 1/2 TO 1 HOUR PRIOR TO ACTIVITY   tamsulosin 0.4 MG Caps capsule Commonly known as:  FLOMAX Take 1 tablet by mouth daily until you pass the kidney stone or no longer have symptoms   TRELEGY ELLIPTA 100-62.5-25 MCG/INH Aepb Generic drug:  Fluticasone-Umeclidin-Vilant   vitamin B-12 500 MCG tablet Commonly known as:  CYANOCOBALAMIN Take 500 mcg daily by mouth.   Vitamin D3 2000 units capsule Take 2,000 Units by mouth daily.       Allergies:  Allergies  Allergen Reactions  . Propofol Other (See Comments)    URINARY RETENTION REQUIRING CATHETERIZATION  . Propranolol  Other (See Comments)    Urinary retention Urinary retention    Family History: Family History  Problem Relation Age of Onset  . Benign prostatic hyperplasia Father   .  Prostate cancer Father   . Hypertension Mother   . Kidney disease Neg Hx   . Kidney cancer Neg Hx   . Bladder Cancer Neg Hx     Social History:  reports that he quit smoking about 51 years ago. His smoking use included cigarettes. He quit after 2.00 years of use. He has never used smokeless tobacco. He reports that he drinks alcohol. He reports that he does not use drugs.  ROS: UROLOGY Frequent Urination?: Yes Hard to postpone urination?: Yes Burning/pain with urination?: No Get up at night to urinate?: Yes Leakage of urine?: No Urine stream starts and stops?: Yes Trouble starting stream?: No Do you have to strain to urinate?: No Blood in urine?: No Urinary tract infection?: No Sexually transmitted disease?: No Injury to kidneys or bladder?: No Painful intercourse?: No Weak stream?: Yes Erection problems?: Yes Penile pain?: No  Gastrointestinal Nausea?: No Vomiting?: No Indigestion/heartburn?: No Diarrhea?: No Constipation?: No  Constitutional Fever: No Night sweats?: No Weight loss?: No Fatigue?: No  Skin Skin rash/lesions?: No Itching?: No  Eyes Blurred vision?: Yes Double vision?: No  Ears/Nose/Throat Sore throat?: Yes Sinus problems?: No  Hematologic/Lymphatic Swollen glands?: No Easy bruising?: No  Cardiovascular Leg swelling?: No Chest pain?: No  Respiratory Cough?: No Shortness of breath?: Yes  Endocrine Excessive thirst?: No  Musculoskeletal Back pain?: Yes Joint pain?: Yes  Neurological Headaches?: No Dizziness?: No  Psychologic Depression?: No Anxiety?: Yes  Physical Exam: BP (!) 143/85 (BP Location: Left Arm, Patient Position: Sitting, Cuff Size: Large)   Pulse 85   Ht 6\' 1"  (1.854 m)   Wt (!) 374 lb (169.6 kg)   BMI 49.34 kg/m   Constitutional: Well nourished. Alert and oriented, No acute distress. HEENT: [place] AT, moist mucus membranes. Trachea midline, no masses. Cardiovascular: No clubbing, cyanosis, or  edema. Respiratory: Normal respiratory effort, no increased work of breathing. GI: Abdomen is soft, non tender, non distended, no abdominal masses. Liver and spleen not palpable.  No hernias appreciated.  Stool sample for occult testing is not indicated.   GU: No CVA tenderness.  No bladder fullness or masses.  Patient with buried phallus.   Urethral meatus is patent.  No penile discharge. No penile lesions or rashes. Scrotum without lesions, cysts, rashes and/or edema.  Testicles are located scrotally bilaterally. No masses are appreciated in the testicles. Left and right epididymis are normal. Rectal: Patient with  normal sphincter tone. Anus and perineum without scarring or rashes. No rectal masses are appreciated. Prostate is approximately 50 grams, could palpate apex and midportion, no nodules are appreciated.  Skin: No rashes, bruises or suspicious lesions. Lymph: No cervical or inguinal adenopathy. Neurologic: Grossly intact, no focal deficits, moving all 4 extremities. Psychiatric: Normal mood and affect.   Laboratory Data: Lab Results  Component Value Date   WBC 13.5 (H) 08/20/2017   HGB 16.6 08/20/2017   HCT 46.8 08/20/2017   MCV 92.0 08/20/2017   PLT 225 08/20/2017    Lab Results  Component Value Date   CREATININE 1.33 (H) 08/20/2017   PSA history:      3.0 ng/mL on 11/25/2011      3.2 ng/mL on 05/25/2012-biopsy in May      3.4 ng/mL on 09/11/2012      1.7 ng/mL on 03/01/2013  1.4 ng/mL on 06/30/2013      2.4 ng/mL on 11/09/2013      2.1 ng/mL on 03/11/2014      2.7 ng/mL on 05/06/2015      2.6 ng/mL on 11/13/2015      2.56 ng/mL in 04/2016      3.60 ng/mL in 03/2017      2.7 ng/mL in 06/2017  I have reviewed the labs.  Pertinent Imaging CLINICAL DATA:  Right flank pain radiating to the groin for the last several days. Hematuria. Previous history of kidney stones.  EXAM: CT ABDOMEN AND PELVIS WITHOUT CONTRAST  TECHNIQUE: Multidetector CT imaging of the  abdomen and pelvis was performed following the standard protocol without IV contrast.  COMPARISON:  03/26/2017  FINDINGS: Lower chest: Lung bases are clear.  Hepatobiliary: No focal liver abnormality is seen. No gallstones, gallbladder wall thickening, or biliary dilatation.  Pancreas: Unremarkable. No pancreatic ductal dilatation or surrounding inflammatory changes.  Spleen: Normal in size without focal abnormality.  Adrenals/Urinary Tract: 1.5 cm diameter left adrenal gland nodule with low-attenuation Hounsfield measurements consistent with adenoma. No change since prior study. Multiple bilateral renal calcifications. Largest is in the right lower pole measuring 12 mm diameter. Moderate right hydronephrosis and hydroureter. There is a 4 mm stone in the distal right ureter just above the ureterovesical junction and a probable 3 mm stone in the distal right ureter at the ureterovesical junction. No bladder stones are demonstrated. Bladder wall is not thickened. Prostate impression on the bladder base.  Stomach/Bowel: Stomach, small bowel, and colon are mostly decompressed with scattered stool in the colon. No wall thickening or inflammatory changes are appreciated. Appendix is not identified.  Vascular/Lymphatic: Minimal calcification of the aorta. No aneurysm. No significant lymphadenopathy.  Reproductive: Prostate gland is diffusely enlarged, measuring 5.9 cm diameter.  Other: No free air or free fluid in the abdomen. Abdominal wall musculature appears intact.  Musculoskeletal: Postoperative changes in both hips. Degenerative changes in the lumbar spine. Spinal stimulator lead tips over the lower thoracic region. Generator pack in the soft tissues posterior to the right lower abdomen.  IMPRESSION: 1. 4 mm stone in the distal right ureter above the ureterovesical junction with moderate proximal obstruction. Additional 3 mm stone in the distal right ureter  at the ureterovesical junction. 2. Multiple bilateral nonobstructing intrarenal calcifications. 3. Left adrenal gland adenoma, unchanged. 4. Enlarged prostate gland. 5. Mild aortic atherosclerosis.   Electronically Signed   By: Lucienne Capers M.D.   On: 08/21/2017 00:01  I have reviewed the films   Assessment & Plan:   1. Prostate cancer:   Patient first underwent TRUSPBx of prostate in 2010 for a PSA of 4 and found to have a Gleason's 6. 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  Patient is currently on active surveillance.  His current PSA is 2.7 ng/mL in 06/2017.  He will follow-up in 6 months time for an exam and PSA - if PSA is stable  2. BPH with LUTS IPSS score is 33/5, it is worsening Continue conservative management, avoiding bladder irritants and timed voiding's Patient has failed medical therapy, but he is not interested in pursuing a possible outlet procedure at this time RTC in 6 months for IPSS, PSA, PVR and exam - PSA is stable  3. Erectile dysfunction SHIM score was 15, it is improving RTC in 6 months for SHIM and  exam  4. History of nephrolithiasis Seen in the ED in 07/2017 for a right obstructing ureteral stone Schedule RUS to ensure hydronephrosis has resolved   Return in about 6 months (around 05/26/2018) for IPSS, SHIM, UA, KUB, CBC, BMP, PSA and exam.  Zara Council, [facility]  [place], [place] [address] [phone]

## 2017-11-24 NOTE — Telephone Encounter (Signed)
Called lab, specimen still available. Lab ordered.

## 2017-11-24 NOTE — Addendum Note (Signed)
Addended by: Donalee Citrin on: 11/24/2017 10:51 AM   Modules accepted: Orders

## 2017-11-26 LAB — URINE CULTURE: Culture: NO GROWTH

## 2017-12-04 ENCOUNTER — Ambulatory Visit
Admission: RE | Admit: 2017-12-04 | Discharge: 2017-12-04 | Disposition: A | Discharge status: Home / Self Care | Payer: Medicare Other | Source: Ambulatory Visit | Attending: Urology | Admitting: Urology

## 2017-12-04 DIAGNOSIS — N400 Enlarged prostate without lower urinary tract symptoms: Secondary | ICD-10-CM | POA: Insufficient documentation

## 2017-12-04 DIAGNOSIS — N200 Calculus of kidney: Secondary | ICD-10-CM | POA: Diagnosis present

## 2018-01-09 ENCOUNTER — Encounter: Payer: Self-pay | Admitting: Emergency Medicine

## 2018-01-09 ENCOUNTER — Other Ambulatory Visit: Payer: Self-pay

## 2018-01-09 ENCOUNTER — Ambulatory Visit
Admission: EM | Admit: 2018-01-09 | Discharge: 2018-01-09 | Disposition: A | Discharge status: Home / Self Care | Payer: Medicare Other | Attending: Family Medicine | Admitting: Family Medicine

## 2018-01-09 DIAGNOSIS — J441 Chronic obstructive pulmonary disease with (acute) exacerbation: Secondary | ICD-10-CM | POA: Diagnosis not present

## 2018-01-09 MED ORDER — PREDNISONE 50 MG PO TABS: ORAL_TABLET | ORAL | 0 refills | Status: DC

## 2018-01-09 MED ORDER — BENZONATATE 100 MG PO CAPS: 100.0000 mg | ORAL_CAPSULE | Freq: Three times a day (TID) | ORAL | 0 refills | Status: DC | PRN

## 2018-01-09 MED ORDER — DOXYCYCLINE HYCLATE 100 MG PO CAPS: 100.0000 mg | ORAL_CAPSULE | Freq: Two times a day (BID) | ORAL | 0 refills | Status: DC

## 2018-01-09 NOTE — ED Triage Notes (Signed)
Coughing up yellow phlegm, nasal congestion for 6 days

## 2018-01-09 NOTE — Discharge Instructions (Signed)
Medications as prescribed. ° °Take care ° °Dr. Claudetta Sallie  °

## 2018-01-09 NOTE — ED Provider Notes (Signed)
[facility]    CSN: [id number] Arrival date & time: 01/09/18  1801  History   Chief Complaint Chief Complaint  Patient presents with  . Cough   HPI  67 year old male presents with cough.  Patient has known COPD.  Followed by pulmonology.  Presents with 6-day history of severe cough which is productive.  Associated congestion.  He has coughed so hard it has exacerbated his back pain.  No fever.  No reports of chills.  No known exacerbating factors. No known relieving factors. No other associated symptoms. No other complaints.   PMH, Surgical Hx, Family Hx, Social History reviewed and updated as below.  Past Medical History:  Diagnosis Date  . Anemia   . Anxiety   . 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 Bilateral carpal tunnel syndrome   . Cancer [facility]) 2011   Prostate Ca  . Carpal tunnel syndrome   . Chronic kidney disease   . Complication of anesthesia    urinary retention with Popanol  . Depression   . Difficult intubation   . Dyspnea    RECENT-SUPPOSED TO HAVE STRESS TEST 09-26-16 WITH DR Ubaldo Glassing BUT CANCELLED DUE TO STENT PLACEMENT ON 09-27-16  . ED (erectile dysfunction)   . GERD (gastroesophageal reflux disease)    rare  . Hernia, hiatal   . History of blood product transfusion   . History of hiatal hernia   . History of kidney stones   . Hyperlipemia   . Hypertension   . Kidney stone   . Neuropathy   . Neuropathy   . Obesity   . Over weight   . Presence of permanent cardiac pacemaker    Medtronic  . Prostatitis   . PTSD (post-traumatic stress disorder)   . Sleep apnea    OSA--use Bi-PAP  . Urinary retention     Patient Active Problem List   Diagnosis Date Noted  . Pain medication agreement signed 04/15/2017  . Spondylosis of lumbar region without myelopathy or radiculopathy 01/20/2017  . Neck pain 01/20/2017  . Numbness and tingling 09/20/2016  . Cervical spondylosis 08/07/2016  . Numbness of upper extremity (Bilateral) 07/30/2016  . Chronic  cervical radiculopathy (Bilateral) 07/30/2016  . Carpal tunnel syndrome (Bilateral) 07/30/2016  . Cervical facet syndrome (Bilateral) 07/30/2016  . Chronic shoulder pain (Bilateral) (L>R) 06/18/2016  . Cervicogenic headache (Bilateral) (L>R) 06/18/2016  . Lumbar lateral recess stenosis (Bilateral: L2-3, L3-4, and L4-5) 06/18/2016  . Lumbar facet syndrome (Bilateral) (R>L) 06/18/2016  . 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 Neurogenic pain 06/18/2016  . Neuropathic pain 06/18/2016  . Thoracic facet arthropathy (Bilateral: T7-8, T8-9, and T9-10) 06/18/2016  . DDD (degenerative disc disease), lumbar 06/18/2016  . DDD (degenerative disc disease), cervical 06/18/2016  . DDD (degenerative disc disease), thoracic 06/18/2016  . Neuropathy 04/16/2016  . Elevated C-reactive protein (CRP) 04/10/2016  . Chronic pain syndrome 03/11/2016  . Chronic prescription benzodiazepine use 03/11/2016  . Lower extremity neuropathy (Bilateral) 03/11/2016  . Chronic hip pain (Primary Source of Pain) (Bilateral) (R>L) 03/11/2016  . 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 Chronic low back pain (Secondary source of pain) (Bilateral) (R>L) 03/11/2016  . Chronic neck pain [facility] source of pain) (Midline) (Bilateral) 03/11/2016  . Disturbance of skin sensation 03/11/2016  . Lumbar facet arthropathy (Bilateral: L12, L2-3, L3-4, L4-5, L5-S1) 03/11/2016  . Lumbar foraminal stenosis (Bilateral: L2-3, L3-4, and L4-5) 03/11/2016  . Thoracic foraminal stenosis (Right: T8-9 and T9-10) 03/11/2016  .  Lumbar spondylosis 03/11/2016  . Osteoarthritis 03/11/2016  . Thoracic spondylosis 03/11/2016  . Medtronic spinal cord stimulator 03/11/2016  . Acquired heart block 12/07/2015  . 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 Lower urinary tract symptoms 09/13/2014  . Anxiety 06/18/2014  . Cardiac disease 06/18/2014  . Hyperlipidemia,  unspecified 06/18/2014  . BP (high blood pressure) 06/18/2014  . Prostate cancer ([place]) 06/18/2014  . ED (erectile dysfunction) 06/18/2014  . Prostatitis 06/18/2014  . Obesity 06/18/2014  . Urinary retention 06/18/2014  . Mechanical complication of prosthetic joint implant ([place]) 07/12/2013  . 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 APPENDECTOMY    . [place], 2006   Lumbar Spine  . BACK SURGERY     x2 Laminectomy  . CARDIAC PACEMAKER PLACEMENT    . CERVICAL FUSION    . CERVICAL SPINE SURGERY N/A 2008  . COLONOSCOPY    . CYSTOSCOPY W/ URETERAL STENT PLACEMENT Bilateral 11/01/2016   Procedure: CYSTOSCOPY WITH STENT REPLACEMENT;  Surgeon: [name], MD;  Location: [facility];  Service: Urology;  Laterality: Bilateral;  . CYSTOSCOPY W/ URETERAL STENT PLACEMENT Left 12/10/2016   Procedure: CYSTOSCOPY WITH STENT REPLACEMENT;  Surgeon: Abbie Sons, MD;  Location: [facility];  Service: Urology;  Laterality: Left;  . CYSTOSCOPY W/ URETERAL STENT REMOVAL Bilateral 11/27/2016   Procedure: CYSTOSCOPY WITH STENT REMOVAL/EXCHANGE;  Surgeon: [name], MD;  Location: [facility];  Service: Urology;  Laterality: Bilateral;  . CYSTOSCOPY WITH STENT PLACEMENT Bilateral 09/27/2016   Procedure: CYSTOSCOPY WITH STENT PLACEMENT;  Surgeon: [name], MD;  Location: [facility];  Service: Urology;  Laterality: Bilateral;  . EXCISION OF ABDOMINAL WALL TUMOR N/A 03/08/2016   Procedure: EXCISION OF ABDOMINAL WALL MASS;  Surgeon: Leonie Green, MD;  Location: [facility];  Service: General;  Laterality: N/A;  . JOINT REPLACEMENT  2004, 2005, 2014, 2015   Bilat Hip Replacements  . SPINAL CORD STIMULATOR INSERTION N/A 07/08/2014   Procedure: LUMBAR SPINAL CORD STIMULATOR INSERTION;  Surgeon: Clydell Hakim, MD;  Location: [place];  Service: Neurosurgery;  Laterality: N/A;  LUMBAR SPINAL CORD  STIMULATOR INSERTION  . TONSILLECTOMY    . URETEROSCOPY WITH HOLMIUM LASER LITHOTRIPSY Bilateral 11/01/2016   Procedure: URETEROSCOPY WITH HOLMIUM LASER LITHOTRIPSY;  Surgeon: [name], MD;  Location: [facility];  Service: Urology;  Laterality: Bilateral;  . URETEROSCOPY WITH HOLMIUM LASER LITHOTRIPSY Left 12/10/2016   Procedure: URETEROSCOPY WITH HOLMIUM LASER LITHOTRIPSY;  Surgeon: Abbie Sons, MD;  Location: [facility];  Service: Urology;  Laterality: Left;  . URETEROSCOPY WITH HOLMIUM LASER LITHOTRIPSY Left 11/27/2016   Procedure: URETEROSCOPY WITH HOLMIUM LASER LITHOTRIPSY;  Surgeon: [name], MD;  Location: [facility];  Service: Urology;  Laterality: Left;       Home Medications    Prior to Admission medications   Medication Sig Start Date End Date Taking? Authorizing Provider  albuterol (PROVENTIL HFA;VENTOLIN HFA) 108 (90 Base) MCG/ACT inhaler Inhale into the lungs. 12/02/16  Yes [provider]  amLODipine (NORVASC) 10 MG tablet Take 10 mg by mouth every morning.  06/08/14  Yes [provider]  atenolol (TENORMIN) 100 MG tablet Take by mouth. 05/26/08  Yes [provider]  benazepril (LOTENSIN) 20 MG tablet Take 20 mg by mouth every morning.  06/08/14  Yes [provider]  Buprenorphine 15 MCG/HR PTWK PLACE 1 PATCH ON THE SKIN EVERY 7 DAYS  10/20/17  Yes [provider]  Cholecalciferol (VITAMIN D3) 2000 UNITS capsule Take 2,000 Units by mouth daily.    Yes [provider]  citalopram (CELEXA) 40 MG tablet  03/31/17  Yes [provider]  Fluticasone-Salmeterol (ADVAIR) 250-50 MCG/DOSE AEPB Inhale into the lungs. 12/02/16  Yes [provider]  hydrALAZINE (APRESOLINE) 25 MG tablet Take 25 mg by mouth 2 (two) times daily.   Yes [provider]  metoprolol tartrate (LOPRESSOR) 25 MG tablet Take 25 mg by mouth 2 (two) times daily.  08/05/16  Yes [provider]  Potassium Citrate  (UROCIT-K 15) 15 MEQ (1620 MG) TBCR Take 1 tablet by mouth 3 (three) times daily. 08/11/17  Yes McGowan, Larene Beach A, PA-C  sildenafil (REVATIO) 20 MG tablet TAKE 2 TO 5 TABLETS 1/2 TO 1 HOUR PRIOR TO ACTIVITY 11/24/17  Yes McGowan, Larene Beach A, PA-C  tamsulosin (FLOMAX) 0.4 MG CAPS capsule Take 1 tablet by mouth daily until you pass the kidney stone or no longer have symptoms 08/21/17  Yes Hinda Kehr, MD  TRELEGY ELLIPTA 100-62.5-25 MCG/INH AEPB  11/23/17  Yes [provider]  benzonatate (TESSALON) 100 MG capsule Take 1 capsule (100 mg total) by mouth 3 (three) times daily as needed. 01/09/18   Coral Spikes, DO  doxycycline (VIBRAMYCIN) 100 MG capsule Take 1 capsule (100 mg total) by mouth 2 (two) times daily. 01/09/18   Coral Spikes, DO  predniSONE (DELTASONE) 50 MG tablet 1 tablet daily x 5 days 01/09/18   Coral Spikes, DO  vitamin B-12 (CYANOCOBALAMIN) 500 MCG tablet Take 500 mcg daily by mouth.    [provider]    Family History Family History  Problem Relation Age of Onset  . Benign prostatic hyperplasia Father   . Prostate cancer Father   . Hypertension Mother   . Kidney disease Neg Hx   . Kidney cancer Neg Hx   . Bladder Cancer Neg Hx     Social History Social History   Tobacco Use  . Smoking status: Former Smoker    Years: 2.00    Types: Cigarettes    Last attempt to quit: 06/18/1966    Years since quitting: 51.5  . Smokeless tobacco: Never Used  . Tobacco comment: quit 40 years ago  Substance Use Topics  . Alcohol use: Yes    Alcohol/week: 0.0 standard drinks    Comment: Occasional  . Drug use: No     Allergies   Propofol and Propranolol  Review of Systems Review of Systems  Constitutional: Negative for fever.  Respiratory: Positive for cough.   Musculoskeletal: Positive for back pain.   Physical Exam Triage Vital Signs ED Triage Vitals  Enc Vitals Group     BP 01/09/18 1815 (!) 150/81     Pulse Rate 01/09/18 1815 97     Resp  01/09/18 1815 18     Temp 01/09/18 1815 98 F (36.7 C)     Temp Source 01/09/18 1815 Oral     SpO2 01/09/18 1815 96 %     Weight 01/09/18 1819 (!) 378 lb (171.5 kg)     Height 01/09/18 1819 6\' 1"  (1.854 m)     Head Circumference --      Peak Flow --      Pain Score 01/09/18 1818 8     Pain Loc --      Pain Edu? --      Excl. in [place]? --    Updated Vital Signs  BP (!) 150/81 (BP Location: Right Arm)   Pulse 97   Temp 98 F (36.7 C) (Oral)   Resp 18   Ht 6\' 1"  (1.854 m)   Wt (!) 171.5 kg   SpO2 96%   BMI 49.87 kg/m   Visual Acuity Right Eye Distance:   Left Eye Distance:   Bilateral Distance:    Right Eye Near:   Left Eye Near:    Bilateral Near:     Physical Exam Vitals signs and nursing note reviewed.  Constitutional:      General: He is not in acute distress. HENT:     Head: Normocephalic and atraumatic.     Nose: Nose normal.  Eyes:     General: No scleral icterus.    Conjunctiva/sclera: Conjunctivae normal.  Cardiovascular:     Rate and Rhythm: Normal rate and regular rhythm.  Pulmonary:     Effort: Pulmonary effort is normal.     Comments: Faint wheezing. Neurological:     Mental Status: He is alert.  Psychiatric:        Mood and Affect: Mood normal.        Behavior: Behavior normal.    UC Treatments / Results  Labs (all labs ordered are listed, but only abnormal results are displayed) Labs Reviewed - No data to display  EKG None  Radiology No results found.  Procedures Procedures (including critical care time)  Medications Ordered in UC Medications - No data to display  Initial Impression / Assessment and Plan / UC Course  I have reviewed the triage vital signs and the nursing notes.  Pertinent labs & imaging results that were available during my care of the patient were reviewed by me and considered in my medical decision making (see chart for details).    67 year old male presents with a COPD exacerbation.  Treating with prednisone,  Tessalon Perles, doxycycline.  Final Clinical Impressions(s) / UC Diagnoses   Final diagnoses:  COPD exacerbation [facility])     Discharge Instructions     Medications as prescribed.  Take care  Dr. Lacinda Axon    ED Prescriptions    Medication Sig Dispense Auth. Provider   doxycycline (VIBRAMYCIN) 100 MG capsule Take 1 capsule (100 mg total) by mouth 2 (two) times daily. 14 capsule Peng Thorstenson G, DO   predniSONE (DELTASONE) 50 MG tablet 1 tablet daily x 5 days 5 tablet Alysah Carton G, DO   benzonatate (TESSALON) 100 MG capsule Take 1 capsule (100 mg total) by mouth 3 (three) times daily as needed. 30 capsule Coral Spikes, DO     Controlled Substance Prescriptions [place] Controlled Substance Registry consulted? Not Applicable   Coral Spikes, DO 01/09/18 1907

## 2018-01-27 DIAGNOSIS — J441 Chronic obstructive pulmonary disease with (acute) exacerbation: Secondary | ICD-10-CM | POA: Insufficient documentation

## 2018-01-28 DIAGNOSIS — Z9841 Cataract extraction status, right eye: Secondary | ICD-10-CM

## 2018-01-28 HISTORY — DX: Cataract extraction status, right eye: Z98.41

## 2018-05-25 ENCOUNTER — Other Ambulatory Visit: Payer: Self-pay | Admitting: Radiology

## 2018-06-01 ENCOUNTER — Ambulatory Visit: Payer: Medicare Other | Admitting: Urology

## 2018-07-06 ENCOUNTER — Ambulatory Visit: Payer: Medicare Other | Admitting: Urology

## 2018-07-30 ENCOUNTER — Telehealth: Payer: Self-pay | Admitting: Urology

## 2018-07-30 DIAGNOSIS — N200 Calculus of kidney: Secondary | ICD-10-CM

## 2018-07-30 DIAGNOSIS — N138 Other obstructive and reflux uropathy: Secondary | ICD-10-CM

## 2018-07-30 NOTE — Telephone Encounter (Signed)
Patient is scheduled for an appointment with Richard Duncan in [place] on Monday with labs and a KUB prior.    Please place orders.

## 2018-07-30 NOTE — Telephone Encounter (Signed)
Orders placed.

## 2018-08-02 NOTE — Progress Notes (Signed)
2:23 PM   Richard Duncan Nov 18, 1950 [id number]  Referring provider: Sofie Hartigan, MD [place],  [place] [address]  Chief Complaint  Patient presents with  . Prostate Cancer    88month    HPI: [name] is a 68 year old male with prostate cancer, BPH with LU TS, erectile dysfunction and nephrolithiasis who presents today for a 6 month follow-up.  Prostate cancer Patient first underwent TRUSPBx of prostate in 2010 for a PSA of 4 and found to have a Gleason's 6. 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  PSA on 08/03/2018 was 2.96.    BPH WITH LUTS: His IPSS score today is 16, which is moderate lower urinary tract symptomatology. He is terrible with his quality life due to his urinary symptoms. His previous IPSS score was 33/5.  He is complaining of frequency, urgency, nocturia and incontinence.  He denies any dysuria, hematuria or suprapubic pain.  Patient found good symptom control on Cialis 5 mg daily, but he found the prescription cost prohibitive and had to discontinue the medication.  He did not find tamsulosin effective.  He also denies any recent fevers, chills, nausea or vomiting.  He has a family history of PCa, with his father being diagnosed with PCa.  [place] Name 08/03/18 1400         International Prostate Symptom Score   How often have you had the sensation of not emptying your bladder?  Less than 1 in 5     How often have you had to urinate less than every two hours?  Less than half the time     How often have you found you stopped and started again several times when you urinated?  Less than 1 in 5 times     How often have you found it difficult to postpone urination?  More than half the time     How often have you had a weak urinary stream?  Less than half the time     How often have you had to strain to start urination?  Less than half  the time     How many times did you typically get up at night to urinate?  4 Times     Total IPSS Score  16       Quality of Life due to urinary symptoms   If you were to spend the rest of your life with your urinary condition just the way it is now how would you feel about that?  Terrible        Score:  1-7 Mild 8-19 Moderate 20-35 Severe  Erectile dysfunction His SHIM score is 12, which is mild to moderate ED.   His previous SHIM score was 15.  He has been having difficulty with erections for several years   His risk factors for ED are age, BPH, prostate cancer and HTN   He denies any painful erections or curvatures with his erections.   He is no longer having spontaneous erections.  He has tried PDE5i and a vacuum erect aid device in the past without success.   SHIM    [place] Name 08/03/18 1404         SHIM: Over the last 6 months:   How do you rate your confidence that you could get and keep an erection?  Very Low  When you had erections with sexual stimulation, how often were your erections hard enough for penetration (entering your partner)?  A Few Times (much less than half the time)     During sexual intercourse, how often were you able to maintain your erection after you had penetrated (entered) your partner?  Sometimes (about half the time)     During sexual intercourse, how difficult was it to maintain your erection to completion of intercourse?  Difficult     When you attempted sexual intercourse, how often was it satisfactory for you?  Sometimes (about half the time)       SHIM Total Score   SHIM  12        Score: 1-7 Severe ED 8-11 Moderate ED 12-16 Mild-Moderate ED 17-21 Mild ED 22-25 No ED  History of nephrolithiasis CT Renal stone study on 09/22/2016 noted mild right hydronephrosis due to 6 mm calculus at right  ureteropelvic junction.  11 mm distal left ureteral calculus and 10 mm calculus in left renal pelvis. No left hydroureteronephrosis.  Bilateral  intrarenal calculi.  Stable small benign left adrenal adenoma. Enlarged prostate.   Patient underwent staged procedures for bilateral ureteral stones which was completed in 11/2016 - bilateral ureteral stent placements on 09/27/2016,  right URS on 11/01/2016, left URS on 11/27/2016 and left URS on 12/10/2016  Contrast CT performed in 02/2017 noted normal right adrenal gland. Low-attenuation left adrenal nodule measuring 1.2 cm is unchanged compatible with a benign adenoma. Small kidney stones. The largest is in the inferior pole of the right kidney measuring 4 mm. No mass or hydronephrosis.  Mild bilateral renal cortical scarring noted. No bladder calculi identified.    CT Renal stone study 08/20/2017 revealed 1.5 cm diameter left adrenal gland nodule with low-attenuation Hounsfield measurements consistent with adenoma. No change since prior study. Multiple bilateral renal calcifications. Largest is in the right lower pole measuring 12 mm diameter. Moderate right hydronephrosis and hydroureter. There is a 4 mm stone in the distal right ureter just above the ureterovesical junction and a probable 3 mm stone in the distal right ureter at the ureterovesical junction. No bladder stones are demonstrated. Bladder wall is not thickened. Prostate impression on the bladder base.    RUS in 11/2017 was negative for hydronephrosis. Echogenic foci in the right kidney consistent with stones.   CT demonstrated stones in the left kidney are not well seen on sonography.  Enlarged prostate gland with mass effect/protrusion at the posterior bladder.   KUB 08/03/2018 noted stable bilateral nephrolithiasis.  He has no complaints of renal colic at this time.    Microscopic hematuria (high risk) Former smoker.  Several upper tract imaging over the last two years (see above).  Cystoscopy on 09/27/2016 with Dr. Pilar Jarvis during bilateral stent placements noted a large intravesical lobe.  Cystoscopy on 11/01/2016 with Dr.  Pilar Jarvis during URS noted a very hypervascular and high riding bladder neck.  Cystoscopy on 12/10/2016 with Dr. Bernardo Heater during URS noted the patient's urethra was examined and was normal.  There was significant lateral lobe prostate enlargement with bladder neck elevation and moderate median lobe.  The bladder was then systematically examined in its entirety. There was no evidence for any bladder tumors, stones, or other mucosal pathology.   He denies any recent gross hematuria.  UA today is positive for > 50 RBC's.    PMH: Past Medical History:  Diagnosis Date  . Anemia   . Anxiety   . 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  Bilateral carpal tunnel syndrome   . Cancer [facility]) 2011   Prostate Ca  . Carpal tunnel syndrome   . Chronic kidney disease   . Complication of anesthesia    urinary retention with Popanol  . Depression   . Difficult intubation   . Dyspnea    RECENT-SUPPOSED TO HAVE STRESS TEST 09-26-16 WITH DR Ubaldo Glassing BUT CANCELLED DUE TO STENT PLACEMENT ON 09-27-16  . ED (erectile dysfunction)   . GERD (gastroesophageal reflux disease)    rare  . Hernia, hiatal   . History of blood product transfusion   . History of hiatal hernia   . History of kidney stones   . Hyperlipemia   . Hypertension   . Kidney stone   . Neuropathy   . Neuropathy   . Obesity   . Over weight   . Presence of permanent cardiac pacemaker    [employer]  . Prostatitis   . PTSD (post-traumatic stress disorder)   . Sleep apnea    OSA--use Bi-PAP  . Urinary retention     Surgical History: Past Surgical History:  Procedure Laterality Date  . APPENDECTOMY    . [place], 2006   Lumbar Spine  . BACK SURGERY     x2 Laminectomy  . CARDIAC PACEMAKER PLACEMENT    . CERVICAL FUSION    . CERVICAL SPINE SURGERY N/A 2008  . COLONOSCOPY    . CYSTOSCOPY W/ URETERAL STENT PLACEMENT Bilateral 11/01/2016   Procedure: CYSTOSCOPY WITH STENT REPLACEMENT;  Surgeon: [name], MD;  Location: [facility];  Service:  Urology;  Laterality: Bilateral;  . CYSTOSCOPY W/ URETERAL STENT PLACEMENT Left 12/10/2016   Procedure: CYSTOSCOPY WITH STENT REPLACEMENT;  Surgeon: Abbie Sons, MD;  Location: [facility];  Service: Urology;  Laterality: Left;  . CYSTOSCOPY W/ URETERAL STENT REMOVAL Bilateral 11/27/2016   Procedure: CYSTOSCOPY WITH STENT REMOVAL/EXCHANGE;  Surgeon: [name], MD;  Location: [facility];  Service: Urology;  Laterality: Bilateral;  . CYSTOSCOPY WITH STENT PLACEMENT Bilateral 09/27/2016   Procedure: CYSTOSCOPY WITH STENT PLACEMENT;  Surgeon: [name], MD;  Location: [facility];  Service: Urology;  Laterality: Bilateral;  . EXCISION OF ABDOMINAL WALL TUMOR N/A 03/08/2016   Procedure: EXCISION OF ABDOMINAL WALL MASS;  Surgeon: Leonie Green, MD;  Location: [facility];  Service: General;  Laterality: N/A;  . JOINT REPLACEMENT  2004, 2005, 2014, 2015   Bilat Hip Replacements  . SPINAL CORD STIMULATOR INSERTION N/A 07/08/2014   Procedure: LUMBAR SPINAL CORD STIMULATOR INSERTION;  Surgeon: Clydell Hakim, MD;  Location: [facility];  Service: Neurosurgery;  Laterality: N/A;  LUMBAR SPINAL CORD STIMULATOR INSERTION  . TONSILLECTOMY    . URETEROSCOPY WITH HOLMIUM LASER LITHOTRIPSY Bilateral 11/01/2016   Procedure: URETEROSCOPY WITH HOLMIUM LASER LITHOTRIPSY;  Surgeon: [name], MD;  Location: [facility];  Service: Urology;  Laterality: Bilateral;  . URETEROSCOPY WITH HOLMIUM LASER LITHOTRIPSY Left 12/10/2016   Procedure: URETEROSCOPY WITH HOLMIUM LASER LITHOTRIPSY;  Surgeon: Abbie Sons, MD;  Location: [facility];  Service: Urology;  Laterality: Left;  . URETEROSCOPY WITH HOLMIUM LASER LITHOTRIPSY Left 11/27/2016   Procedure: URETEROSCOPY WITH HOLMIUM LASER LITHOTRIPSY;  Surgeon: [name], MD;  Location: [facility];  Service: Urology;  Laterality: Left;    Home Medications:  Allergies as of 08/03/2018      Reactions   Propofol Other (See Comments)   URINARY RETENTION  REQUIRING CATHETERIZATION   Propranolol Other (See Comments)   Urinary retention Urinary retention  Medication List       Accurate as of August 03, 2018  2:23 PM. If you have any questions, ask your nurse or doctor.        STOP taking these medications   benzonatate 100 MG capsule Commonly known as: TESSALON Stopped by: Zara Council, PA-C   Buprenorphine 15 MCG/HR Ptwk Stopped by: Tayna Smethurst, PA-C   doxycycline 100 MG capsule Commonly known as: VIBRAMYCIN Stopped by: Miku Udall, PA-C   predniSONE 50 MG tablet Commonly known as: DELTASONE Stopped by: Eldar Robitaille, PA-C   tamsulosin 0.4 MG Caps capsule Commonly known as: FLOMAX Stopped by: Eulalio Reamy, PA-C   Trelegy Ellipta 100-62.5-25 MCG/INH Aepb Generic drug: Fluticasone-Umeclidin-Vilant Stopped by: Zara Council, PA-C   vitamin B-12 500 MCG tablet Commonly known as: CYANOCOBALAMIN Stopped by: Delaina Fetsch, PA-C     TAKE these medications   albuterol 108 (90 Base) MCG/ACT inhaler Commonly known as: VENTOLIN HFA Inhale into the lungs.   amLODipine 10 MG tablet Commonly known as: NORVASC Take 10 mg by mouth every morning.   atenolol 100 MG tablet Commonly known as: TENORMIN Take by mouth.   benazepril 20 MG tablet Commonly known as: LOTENSIN Take 20 mg by mouth every morning.   citalopram 40 MG tablet Commonly known as: CELEXA   Fluticasone-Salmeterol 250-50 MCG/DOSE Aepb Commonly known as: ADVAIR Inhale into the lungs.   hydrALAZINE 25 MG tablet Commonly known as: APRESOLINE Take 25 mg by mouth 2 (two) times daily.   metoprolol tartrate 25 MG tablet Commonly known as: LOPRESSOR Take 25 mg by mouth 2 (two) times daily.   Potassium Citrate 15 MEQ (1620 MG) Tbcr Commonly known as: Urocit-K 15 Take 1 tablet by mouth 3 (three) times daily.   sildenafil 20 MG tablet Commonly known as: REVATIO TAKE 2 TO 5 TABLETS 1/2 TO 1 HOUR PRIOR TO ACTIVITY   Vitamin D3 50 MCG  (2000 UT) capsule Take 2,000 Units by mouth daily.       Allergies:  Allergies  Allergen Reactions  . Propofol Other (See Comments)    URINARY RETENTION REQUIRING CATHETERIZATION  . Propranolol Other (See Comments)    Urinary retention Urinary retention    Family History: Family History  Problem Relation Age of Onset  . Benign prostatic hyperplasia Father   . Prostate cancer Father   . Hypertension Mother   . Kidney disease Neg Hx   . Kidney cancer Neg Hx   . Bladder Cancer Neg Hx     Social History:  reports that he quit smoking about 52 years ago. His smoking use included cigarettes. He quit after 2.00 years of use. He has never used smokeless tobacco. He reports current alcohol use. He reports that he does not use drugs.  ROS: UROLOGY Frequent Urination?: Yes Hard to postpone urination?: Yes Burning/pain with urination?: No Get up at night to urinate?: Yes Leakage of urine?: Yes Urine stream starts and stops?: No Trouble starting stream?: No Do you have to strain to urinate?: No Blood in urine?: No Urinary tract infection?: No Sexually transmitted disease?: No Injury to kidneys or bladder?: No Painful intercourse?: No Weak stream?: No Erection problems?: No Penile pain?: No  Gastrointestinal Nausea?: No Vomiting?: No Indigestion/heartburn?: No Diarrhea?: No Constipation?: No  Constitutional Fever: No Night sweats?: No Weight loss?: No Fatigue?: No  Skin Skin rash/lesions?: No Itching?: No  Eyes Blurred vision?: No Double vision?: No  Ears/Nose/Throat Sore throat?: No Sinus problems?: No  Hematologic/Lymphatic Swollen glands?: No Easy bruising?:  No  Cardiovascular Leg swelling?: No Chest pain?: No  Respiratory Cough?: No Shortness of breath?: Yes  Endocrine Excessive thirst?: No  Musculoskeletal Back pain?: Yes Joint pain?: Yes  Neurological Headaches?: No Dizziness?: No  Psychologic Depression?: No Anxiety?: Yes   Physical Exam: BP 120/76   Pulse 83   Ht 6\' 1"  (1.854 m)   Wt (!) 361 lb (163.7 kg)   BMI 47.63 kg/m   Constitutional:  Well nourished. Alert and oriented, No acute distress. HEENT: [place] AT, moist mucus membranes.  Trachea midline, no masses. Cardiovascular: No clubbing, cyanosis, or edema. Respiratory: Normal respiratory effort, no increased work of breathing. GI: Abdomen is obese, soft, non tender, non distended, no abdominal masses.  GU: No CVA tenderness.  No bladder fullness or masses.  Patient with buried phallus. Foreskin easily retracted  Urethral meatus is patent.  No penile discharge. No penile lesions or rashes. Scrotum without lesions, cysts, rashes and/or edema.  Testicles are located scrotally bilaterally. No masses are appreciated in the testicles. Left and right epididymis are normal. Rectal: Patient with  normal sphincter tone. Anus and perineum without scarring or rashes. No rectal masses are appreciated. Prostate is approximately 50 grams, could only palpate apex and mid portion of the gland due to body habitus, no nodules are appreciated.  Neurologic: Grossly intact, no focal deficits, moving all 4 extremities. Psychiatric: Normal mood and affect.  Laboratory Data: Lab Results  Component Value Date   WBC 12.2 (H) 08/03/2018   HGB 16.4 08/03/2018   HCT 47.6 08/03/2018   MCV 93.0 08/03/2018   PLT 215 08/03/2018    Lab Results  Component Value Date   CREATININE 1.04 08/03/2018   PSA history:      3.0 ng/mL on 11/25/2011      3.2 ng/mL on 05/25/2012-biopsy in May      3.4 ng/mL on 09/11/2012      1.7 ng/mL on 03/01/2013      1.4 ng/mL on 06/30/2013      2.4 ng/mL on 11/09/2013      2.1 ng/mL on 03/11/2014      2.7 ng/mL on 05/06/2015      2.6 ng/mL on 11/13/2015      2.56 ng/mL in 04/2016      3.60 ng/mL in 03/2017      2.7 ng/mL in 06/2017       3.10 ng/mL in 10/2017      2.96 ng/mL in 07/2018 I have reviewed the labs.  Pertinent Imaging CLINICAL DATA:   Follow-up of kidney stones.  EXAM: ABDOMEN - 1 VIEW  COMPARISON:  Body CT 08/20/2017  FINDINGS: The bowel gas pattern is normal. Known bilateral nephrolithiasis, stable, accounting for differences in imaging techniques.  Bilateral hip arthroplasties noted.  Spinal stimulator in place.  IMPRESSION: Stable bilateral nephrolithiasis.   Electronically Signed   By: Fidela Salisbury M.D.   On: 08/03/2018 19:03  I have independently reviewed the films note bilateral stones.    Assessment & Plan:   1. Prostate cancer:   Patient first underwent TRUSPBx of prostate in 2010 for a PSA of 4 and found to have a Gleason's 6. 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  Patient is currently on active surveillance.  His current PSA is 2.96 ng/mL in 07/2018.  He will follow-up in 6 months time for an exam and PSA   2. BPH with  LUTS IPSS score is 16/6, it is stable Continue conservative management, avoiding bladder irritants and timed voiding's Patient has failed medical therapy and now is interested in pursuing a possible outlet procedure at this time as prior cystoscopies have noted lateral lobe enlargement and a median lobe Schedule cystoscopy to evaluate for BOO and microscopic hematuria  I have explained to the patient that they will  be scheduled for a cystoscopy in our office to evaluate their bladder.  The cystoscopy consists of passing a tube with a lens up through their urethra and into their urinary bladder.   We will inject the urethra with a lidocaine gel prior to introducing the cystoscope to help with any discomfort during the procedure.   After the procedure, they might experience blood in the urine and discomfort with urination.  This will abate after the first few voids.  I have  encouraged the patient to increase water intake  during this time.  Patient denies any  allergies to lidocaine.   3. Microscopic Hematuria (high risk) Patient has had several upper tract imaging and retrogrades over the last two years which is somewhat reassuring Cystoscopy is planned to evaluate for BOO and to evaluate for bladder cancer at this time  4. Erectile dysfunction SHIM score was 12, it is slightly worse Refill given for sildenafil  RTC in 6 months for SHIM and exam  5. Bilateral nephrolithiasis Not interested in any treatment of stones at this time Continue potassium citrate   Return for cystoscopy for AMH and BOO .  Zara Council, PA-C  [facility] [address] [place], [place] [address] [phone]

## 2018-08-03 ENCOUNTER — Ambulatory Visit
Admission: RE | Admit: 2018-08-03 | Discharge: 2018-08-03 | Disposition: A | Discharge status: Home / Self Care | Payer: Medicare Other | Source: Ambulatory Visit | Attending: Urology | Admitting: Urology

## 2018-08-03 ENCOUNTER — Ambulatory Visit (INDEPENDENT_AMBULATORY_CARE_PROVIDER_SITE_OTHER): Payer: Medicare Other | Admitting: Urology

## 2018-08-03 ENCOUNTER — Encounter: Payer: Self-pay | Admitting: Urology

## 2018-08-03 ENCOUNTER — Ambulatory Visit
Admission: RE | Admit: 2018-08-03 | Discharge: 2018-08-03 | Disposition: A | Discharge status: Home / Self Care | Payer: Medicare Other | Attending: Urology | Admitting: Urology

## 2018-08-03 ENCOUNTER — Other Ambulatory Visit
Admission: RE | Admit: 2018-08-03 | Discharge: 2018-08-03 | Disposition: A | Discharge status: Home / Self Care | Payer: Medicare Other | Source: Home / Self Care | Attending: Urology | Admitting: Urology

## 2018-08-03 ENCOUNTER — Other Ambulatory Visit: Payer: Self-pay

## 2018-08-03 VITALS — BP 120/76 | HR 83 | Ht 73.0 in | Wt 361.0 lb

## 2018-08-03 DIAGNOSIS — C61 Malignant neoplasm of prostate: Secondary | ICD-10-CM

## 2018-08-03 DIAGNOSIS — N401 Enlarged prostate with lower urinary tract symptoms: Secondary | ICD-10-CM | POA: Insufficient documentation

## 2018-08-03 DIAGNOSIS — N200 Calculus of kidney: Secondary | ICD-10-CM | POA: Diagnosis present

## 2018-08-03 DIAGNOSIS — N529 Male erectile dysfunction, unspecified: Secondary | ICD-10-CM

## 2018-08-03 DIAGNOSIS — N138 Other obstructive and reflux uropathy: Secondary | ICD-10-CM

## 2018-08-03 DIAGNOSIS — R3129 Other microscopic hematuria: Secondary | ICD-10-CM

## 2018-08-03 LAB — URINALYSIS, COMPLETE (UACMP) WITH MICROSCOPIC
Bilirubin Urine: NEGATIVE
Glucose, UA: NEGATIVE mg/dL
Ketones, ur: NEGATIVE mg/dL
Leukocytes,Ua: NEGATIVE
Nitrite: NEGATIVE
Protein, ur: NEGATIVE mg/dL
RBC / HPF: >50 RBC/hpf (ref 0–5)
Specific Gravity, Urine: 1.02 (ref 1.005–1.030)
WBC, UA: NONE SEEN WBC/hpf (ref 0–5)
pH: 5 (ref 5.0–8.0)

## 2018-08-03 LAB — CBC WITH DIFFERENTIAL/PLATELET
Abs Immature Granulocytes: 0.08 10*3/uL — ABNORMAL HIGH (ref 0.00–0.07)
Basophils Absolute: 0.1 10*3/uL (ref 0.0–0.1)
Basophils Relative: 0 %
Eosinophils Absolute: 0.3 10*3/uL (ref 0.0–0.5)
Eosinophils Relative: 3 %
HCT: 47.6 % (ref 39.0–52.0)
Hemoglobin: 16.4 g/dL (ref 13.0–17.0)
Immature Granulocytes: 1 %
Lymphocytes Relative: 12 %
Lymphs Abs: 1.5 10*3/uL (ref 0.7–4.0)
MCH: 32 pg (ref 26.0–34.0)
MCHC: 34.5 g/dL (ref 30.0–36.0)
MCV: 93 fL (ref 80.0–100.0)
Monocytes Absolute: 0.8 10*3/uL (ref 0.1–1.0)
Monocytes Relative: 6 %
Neutro Abs: 9.4 10*3/uL — ABNORMAL HIGH (ref 1.7–7.7)
Neutrophils Relative %: 78 %
Platelets: 215 10*3/uL (ref 150–400)
RBC: 5.12 MIL/uL (ref 4.22–5.81)
RDW: 13.6 % (ref 11.5–15.5)
WBC: 12.2 10*3/uL — ABNORMAL HIGH (ref 4.0–10.5)
nRBC: 0 % (ref 0.0–0.2)

## 2018-08-03 LAB — PSA: Prostatic Specific Antigen: 2.96 ng/mL (ref 0.00–4.00)

## 2018-08-03 LAB — BASIC METABOLIC PANEL
Anion gap: 9 (ref 5–15)
BUN: 20 mg/dL (ref 8–23)
CO2: 21 mmol/L — ABNORMAL LOW (ref 22–32)
Calcium: 8.7 mg/dL — ABNORMAL LOW (ref 8.9–10.3)
Chloride: 107 mmol/L (ref 98–111)
Creatinine, Ser: 1.04 mg/dL (ref 0.61–1.24)
GFR calc Af Amer: >60 mL/min (ref >60)
GFR calc non Af Amer: >60 mL/min (ref >60)
Glucose, Bld: 105 mg/dL — ABNORMAL HIGH (ref 70–99)
Potassium: 4.2 mmol/L (ref 3.5–5.1)
Sodium: 137 mmol/L (ref 135–145)

## 2018-08-03 MED ORDER — SILDENAFIL CITRATE 20 MG PO TABS: ORAL_TABLET | ORAL | 3 refills | Status: DC

## 2018-08-03 MED ORDER — POTASSIUM CITRATE ER 15 MEQ (1620 MG) PO TBCR: 1.0000 | EXTENDED_RELEASE_TABLET | Freq: Three times a day (TID) | ORAL | 2 refills | Status: DC

## 2018-08-03 NOTE — Addendum Note (Signed)
Addended by: Memory Argue H on: 08/03/2018 01:06 PM   Modules accepted: Orders

## 2018-08-04 ENCOUNTER — Telehealth: Payer: Self-pay

## 2018-08-04 LAB — URINE CULTURE: Culture: <10000 — AB

## 2018-08-04 NOTE — Telephone Encounter (Signed)
Left message for patient to return call for results.

## 2018-08-04 NOTE — Telephone Encounter (Signed)
-----   Message from Nori Riis, PA-C sent at 08/04/2018  7:27 AM EDT ----- Please let Richard Duncan know that his PSA is stable at 2.96.

## 2018-08-04 NOTE — Telephone Encounter (Signed)
mychart sent.

## 2018-08-07 ENCOUNTER — Other Ambulatory Visit: Payer: Self-pay

## 2018-08-07 DIAGNOSIS — N401 Enlarged prostate with lower urinary tract symptoms: Secondary | ICD-10-CM

## 2018-08-07 DIAGNOSIS — N138 Other obstructive and reflux uropathy: Secondary | ICD-10-CM

## 2018-08-11 ENCOUNTER — Telehealth: Payer: Self-pay

## 2018-08-11 ENCOUNTER — Other Ambulatory Visit
Admission: RE | Admit: 2018-08-11 | Discharge: 2018-08-11 | Disposition: A | Discharge status: Home / Self Care | Payer: Medicare Other | Attending: Urology | Admitting: Urology

## 2018-08-11 ENCOUNTER — Other Ambulatory Visit: Payer: Self-pay

## 2018-08-11 ENCOUNTER — Ambulatory Visit (INDEPENDENT_AMBULATORY_CARE_PROVIDER_SITE_OTHER): Payer: Medicare Other | Admitting: Urology

## 2018-08-11 DIAGNOSIS — N401 Enlarged prostate with lower urinary tract symptoms: Secondary | ICD-10-CM | POA: Insufficient documentation

## 2018-08-11 DIAGNOSIS — R350 Frequency of micturition: Secondary | ICD-10-CM | POA: Diagnosis not present

## 2018-08-11 DIAGNOSIS — N138 Other obstructive and reflux uropathy: Secondary | ICD-10-CM | POA: Diagnosis present

## 2018-08-11 LAB — URINALYSIS, COMPLETE (UACMP) WITH MICROSCOPIC
Bilirubin Urine: NEGATIVE
Glucose, UA: NEGATIVE mg/dL
Ketones, ur: NEGATIVE mg/dL
Leukocytes,Ua: NEGATIVE
Nitrite: NEGATIVE
Protein, ur: NEGATIVE mg/dL
Specific Gravity, Urine: 1.025 (ref 1.005–1.030)
WBC, UA: NONE SEEN WBC/hpf (ref 0–5)
pH: 5.5 (ref 5.0–8.0)

## 2018-08-11 NOTE — Telephone Encounter (Signed)
Left message for patient to return call. He needs to go to lab for urinalysis prior to his appt today.

## 2018-08-11 NOTE — Progress Notes (Signed)
Cystoscopy Procedure Note:  Indication: Asymptomatic microscopic hematuria, LUTS  Briefly, this is a co-morbid 68 year old male previously followed by Dr. Pilar Jarvis and Zara Council, PA.  He reportedly was diagnosed with very low risk prostate cancer with 3/12 cores of Gleason 3+3=6 prostate cancer in 2010 and a PSA of 4, and confirmed with a repeat biopsy in 2014 that showed 1/12 cores positive for 3+3 = 6 prostate cancer.  He has not undergone any further prostate biopsies, however PSA has remained stable and low ~2.7.  His primary urinary symptoms are frequency, urgency, urge incontinence, and nocturia.  His urinary symptoms reportedly worsened significantly with Flomax in the past.  Prostate measures 95 cc on ultrasound and CT.  After informed consent and discussion of the procedure and its risks, Richard Duncan was positioned and prepped in the standard fashion. Cystoscopy was performed with a flexible cystoscope. The urethra, bladder neck and entire bladder was visualized in a standard fashion. The prostate was large with obstructing lateral and median lobes.  Mild bladder trabeculations.  Multiple small yellow stones at the base of the bladder.  No bladder tumors seen.  Findings: Large prostate, no mucosal abnormalities, multiple small bladder stones  Assessment and Plan: 68 year old male with very low risk prostate cancer on watchful waiting since 2010 and stable PSA at 2.7, with bothersome mixed obstructive and overactive urinary symptoms.  With his comorbidities, I recommended pursuing urodynamics prior to considering any surgical intervention.  Secondary to his morbid obesity he would not be a candidate for robotic prostatectomy, and he is also unwilling to accept the risks of erectile dysfunction or urinary incontinence with robotic prostatectomy.  We also discussed the possibility of repeating a prostate biopsy prior to undergoing any outlet procedure.  He is very interested in pursuing  HOLEP if urodynamics confirmed bladder outlet obstruction.  -Urodynamics, will call with results -Consider repeat prostate biopsy versus directly to HOLEP if BOO on UDS if patient desires and understands risks and benefits  Richard Madrid, MD 08/11/2018

## 2018-08-11 NOTE — Patient Instructions (Addendum)
Holmium Laser Enucleation of the Prostate (HoLEP)  HoLEP is a treatment for men with benign prostatic hyperplasia (BPH). The laser surgery removed blockages of urine flow, and is done without any incisions on the body.     What is HoLEP?  HoLEP is a type of laser surgery used to treat obstruction (blockage) of urine flow as a result of benign prostatic hyperplasia (BPH). In men with BPH, the prostate gland is not cancerous, but has become enlarged. An enlarged prostate can result in a number of urinary tract symptoms such as weak urinary stream, difficulty in starting urination, inability to urinate, frequent urination, or getting up at night to urinate.  HoLEP was developed in the 1990's as a more effective and less expensive surgical option for BPH, compared to other surgical options such as laser vaporization(PVP/greenlight laser), transurethral resection of the prostate(TURP), and open simple prostatectomy.   What happens during a HoLEP?  HoLEP requires general anesthesia (asleep throughout the procedure).   An antibiotic is given to reduce the risk of infection  A surgical instrument called a resectoscope is inserted through the urethra (the tube that carries urine from the bladder). The resectoscope has a camera that allows the surgeon to view the internal structure of the prostate gland, and to see where the incisions are being made during surgery.  The laser is inserted into the resectoscope and is used to enucleate (free up) the enlarged prostate tissue from the capsule (outer shell) and then to seal up any blood vessels. The tissue that has been removed is pushed back into the bladder.  A morcellator is placed through the resectoscope, and is used to suction out the prostate tissue that has been pushed into the bladder.  When the prostate tissue has been removed, the resectoscope is removed, and a foley catheter is placed to allow healing and drain the urine from the  bladder.     What happens after a HoLEP?  More than 90% of patients go home the same day a few hours after surgery. Less than 10% will be admitted to the hospital overnight for observation to monitor the urine, or if they have other medical problems.  Fluid is flushed through the catheter for about 1 hour after surgery to clear any blood from the urine. It is normal to have some blood in the urine after surgery. The need for blood transfusion is extremely rare.  Eating and drinking are permitted after the procedure once the patient has fully awakened from anesthesia.  The catheter is usually removed 2-3 days after surgery- the patient will come to clinic to have the catheter removed and make sure they can urinate on their own.  It is very important to drink lots of fluids after surgery for one week to keep the bladder flushed.  At first, there may be some burning with urination, but this typically improved within a few hours to days. Most patients do not have a significant amount of pain, and narcotic pain medications are rarely needed.  Symptoms of urinary frequency, urgency, and even leakage are NORMAL for the first few weeks after surgery as the bladder adjusts after having to work hard against blockage from the prostate for many years. This will improve, but can sometimes take several months.  The use of pelvic floor exercises (Kegel exercises) can help improve problems with urinary incontinence.   After catheter removal, patients will be seen at 6 weeks and 6 months for symptom check  No heavy lifting for  at least 2-3 weeks after surgery, however patients can walk and do light activities the first day after surgery. Return to work time depends on occupation.    What are the advantages of HoLEP?  HoLEP has been studied in many different parts of the world and has been shown to be a safe and effective procedure. Although there are many types of BPH surgeries available, HoLEP offers a  unique advantage in being able to remove a large amount of tissue without any incisions on the body, even in very large prostates, while decreasing the risk of bleeding and providing tissue for pathology (to look for cancer). This decreases the need for blood transfusions during surgery, minimizes hospital stay, and reduces the risk of needing repeat treatment.  What are the side effects of HoLEP?  Temporary burning and bleeding during urination. Some blood may be seen in the urine for weeks after surgery and is part of the healing process.  Urinary incontinence (inability to control urine flow) is expected in all patients immediately after surgery and they should wear pads for the first few days/weeks. This typically improves over the course of several weeks. Performing Kegel exercises can help decrease leakage from stress maneuvers such as coughing, sneezing, or lifting. The rate of long term leakage is very low. Patients may also have leakage with urgency and this may be treated with medication. The risk of urge incontinence can be dependent on several factors including age, prostate size, symptoms, and other medical problems.  Retrograde ejaculation or backwards ejaculation. In 75% of cases, the patient will not see any fluid during ejaculation after surgery.  Erectile function is generally not significantly affected.   What are the risks of HoLEP?  Injury to the urethra or development of scar tissue at a later date  Injury to the capsule of the prostate (typically treated with longer catheterization).  Injury to the bladder or ureteral orifices (where the urine from the kidney drains out)  Infection of the bladder, testes, or kidneys  Return of urinary obstruction at a later date requiring another operation (<2%)  Need for blood transfusion or re-operation due to bleeding  Failure to relieve all symptoms and/or need for prolonged catheterization after surgery  5-15% of patients are  found to have previously undiagnosed prostate cancer in their specimen. Prostate cancer can be treated after HoLEP.  Standard risks of anesthesia including blood clots, heart attacks, etc  When should I call my doctor?  Fever over 101.3 degrees  Inability to urinate, or large blood clots in the urine    Overactive Bladder, Adult  Overactive bladder refers to a condition in which a person has a sudden need to pass urine. The person may leak urine if he or she cannot get to the bathroom fast enough (urinary incontinence). A person with this condition may also wake up several times in the night to go to the bathroom. Overactive bladder is associated with poor nerve signals between your bladder and your brain. Your bladder may get the signal to empty before it is full. You may also have very sensitive muscles that make your bladder squeeze too soon. These symptoms might interfere with daily work or social activities. What are the causes? This condition may be associated with or caused by:  Urinary tract infection.  Infection of nearby tissues, such as the prostate.  Prostate enlargement.  Surgery on the uterus or urethra.  Bladder stones, inflammation, or tumors.  Drinking too much caffeine or alcohol.  Certain  medicines, especially medicines that get rid of extra fluid in the body (diuretics).  Muscle or nerve weakness, especially from: ? A spinal cord injury. ? Stroke. ? Multiple sclerosis. ? Parkinson's disease.  Diabetes.  Constipation. What increases the risk? You may be at greater risk for overactive bladder if you:  Are an older adult.  Smoke.  Are going through menopause.  Have prostate problems.  Have a neurological disease, such as stroke, dementia, Parkinson's disease, or multiple sclerosis (MS).  Eat or drink things that irritate the bladder. These include alcohol, spicy food, and caffeine.  Are overweight or obese. What are the signs or  symptoms? Symptoms of this condition include:  Sudden, strong urge to urinate.  Leaking urine.  Urinating 8 or more times a day.  Waking up to urinate 2 or more times a night. How is this diagnosed? Your health care provider may suspect overactive bladder based on your symptoms. He or she will diagnose this condition by:  A physical exam and medical history.  Blood or urine tests. You might need bladder or urine tests to help determine what is causing your overactive bladder. You might also need to see a health care provider who specializes in urinary tract problems (urologist). How is this treated? Treatment for overactive bladder depends on the cause of your condition and whether it is mild or severe. You can also make lifestyle changes at home. Options include:  Bladder training. This may include: ? Learning to control the urge to urinate by following a schedule that directs you to urinate at regular intervals (timed voiding). ? Doing Kegel exercises to strengthen your pelvic floor muscles, which support your bladder. Toning these muscles can help you control urination, even if your bladder muscles are overactive.  Special devices. This may include: ? Biofeedback, which uses sensors to help you become aware of your body's signals. ? Electrical stimulation, which uses electrodes placed inside the body (implanted) or outside the body. These electrodes send gentle pulses of electricity to strengthen the nerves or muscles that control the bladder. ? Women may use a plastic device that fits into the vagina and supports the bladder (pessary).  Medicines. ? Antibiotics to treat bladder infection. ? Antispasmodics to stop the bladder from releasing urine at the wrong time. ? Tricyclic antidepressants to relax bladder muscles. ? Injections of botulinum toxin type A directly into the bladder tissue to relax bladder muscles.  Lifestyle changes. This may include: ? Weight loss. Talk to  your health care provider about weight loss methods that would work best for you. ? Diet changes. This may include reducing how much alcohol and caffeine you consume, or drinking fluids at different times of the day. ? Not smoking. Do not use any products that contain nicotine or tobacco, such as cigarettes and e-cigarettes. If you need help quitting, ask your health care provider.  Surgery. ? A device may be implanted to help manage the nerve signals that control urination. ? An electrode may be implanted to stimulate electrical signals in the bladder. ? A procedure may be done to change the shape of the bladder. This is done only in very severe cases. Follow these instructions at home: Lifestyle  Make any diet or lifestyle changes that are recommended by your health care provider. These may include: ? Drinking less fluid or drinking fluids at different times of the day. ? Cutting down on caffeine or alcohol. ? Doing Kegel exercises. ? Losing weight if needed. ? Eating a healthy  and balanced diet to prevent constipation. This may include:  Eating foods that are high in fiber, such as fresh fruits and vegetables, whole grains, and beans.  Limiting foods that are high in fat and processed sugars, such as fried and sweet foods. General instructions  Take over-the-counter and prescription medicines only as told by your health care provider.  If you were prescribed an antibiotic medicine, take it as told by your health care provider. Do not stop taking the antibiotic even if you start to feel better.  Use any implants or pessary as told by your health care provider.  If needed, wear pads to absorb urine leakage.  Keep a journal or log to track how much and when you drink and when you feel the need to urinate. This will help your health care provider monitor your condition.  Keep all follow-up visits as told by your health care provider. This is important. Contact a health care provider  if:  You have a fever.  Your symptoms do not get better with treatment.  Your pain and discomfort get worse.  You have more frequent urges to urinate. Get help right away if:  You are not able to control your bladder. Summary  Overactive bladder refers to a condition in which a person has a sudden need to pass urine.  Several conditions may lead to an overactive bladder.  Treatment for overactive bladder depends on the cause and severity of your condition.  Follow your health care provider's instructions about lifestyle changes, doing Kegel exercises, keeping a journal, and taking medicines. This information is not intended to replace advice given to you by your health care provider. Make sure you discuss any questions you have with your health care provider. Document Released: 11/10/2008 Document Revised: 05/07/2018 Document Reviewed: 01/30/2017 Elsevier Patient Education  2020 [place] What is urodynamic testing?  Urodynamic testing is a set of tests and X-rays. These tests help to find out how well your bladder and the part of your body that drains pee from the bladder (urethra) are working. Why do I need this testing? You may need these tests if you:  Are leaking pee (urine).  Have trouble starting or stopping peeing (urination).  Pee often or it hurts to pee.  Have urinary tract infections often.  Are not able to empty your bladder.  Have strong urges to pee.  Have a weak flow of pee. How do I prepare for the tests?  Ask your doctor about: ? Changing or stopping your normal medicines. This is important if you take diabetes medicines or blood thinners. ? Whether you should arrive for the test having to pee.  Tell your doctor about: ? Any allergies you have. ? All medicines you are taking, including vitamins, herbs, eye drops, creams, and over-the-counter medicines. ? Whether you are pregnant or may be pregnant. What are the  risks? In general, these tests are safe. But some of the tests have risks. These may include:  Discomfort.  Feeling a need to pee often.  Bleeding.  Infection.  Allergic reactions to medicines or dyes. How are the tests done? The tests may be done in one visit or may be done over a few visits. You may be given an antibiotic medicine to help keep you from getting an infection. The types of tests done may include: Uroflowmetry This test measures how much you pee and how long it takes. You will pee into a type of toilet or  device that sends measurements to a computer. Postvoid residual measurement This test measures how much pee is left in your bladder after you pee. It may be done by:  Using sound waves and a computer to create pictures of your bladder (ultrasound).  Putting a thin, flexible tube (catheter) into your bladder to take out the pee that is left so it can be measured. Cystometric testing This test measures how much pressure there is in your bladder before you pee and as you pee.  You may be given a medicine to numb the area (local anesthetic).  The area around the opening of your urethra will be cleaned.  A thin, flexible tube will be used to empty your bladder.  A flexible tube that can measure pressure will then be placed. Your bladder will be filled with germ-free water.  Pressure will be measured: ? As your bladder fills. ? When you feel the need to pee. ? As your bladder is emptied.  In some cases, your bladder may be filled with a dye that shows up on X-rays (contrast material) so that X-ray pictures can be taken during the test. Electromyogram This test measures the activity of the nerves and muscles in your bladder and in the tube that empties your bladder. Sticky patches (electrodes) will be placed on your body to measure electrical activity. What happens after the testing?  You should be able to go home right away.  You can do your usual  activities.  You may be told to drink a glass of water every 30 minutes. Do this for the first 2 hours you are home.  Taking a warm bath or using a warm, wet towel may relieve any discomfort. Let your doctor know if you have:  Pain.  Blood in your pee.  Chills.  Fever. What do my test results mean? Talk with your doctor about what your results mean. These test results can help your doctor find out how well your bladder and the tube that empties your bladder are working. Your results and your symptoms can help your doctor find what might be causing your problems. Questions to ask your doctor Ask your doctor, or the department that is doing the test:  When will my results be ready?  How will I get my results?  What are my treatment options?  What other tests do I need?  What are my next steps? Summary  Urodynamic testing is a set of tests and X-rays.  These tests help to find out how well your bladder and the part of your body that drains pee from the bladder are working.  Your results and your symptoms can help your doctor find what might be causing your problems.  Talk with your doctor about what your results mean. This information is not intended to replace advice given to you by your health care provider. Make sure you discuss any questions you have with your health care provider. Document Released: 12/28/2007 Document Revised: 05/05/2018 Document Reviewed: 11/18/2016 Elsevier Patient Education  2020 [employer].

## 2018-08-26 ENCOUNTER — Other Ambulatory Visit: Payer: Self-pay | Admitting: Urology

## 2018-09-08 ENCOUNTER — Telehealth: Payer: Self-pay | Admitting: Radiology

## 2018-09-08 ENCOUNTER — Other Ambulatory Visit: Payer: Self-pay

## 2018-09-08 ENCOUNTER — Other Ambulatory Visit: Payer: Self-pay | Admitting: Radiology

## 2018-09-08 ENCOUNTER — Telehealth (INDEPENDENT_AMBULATORY_CARE_PROVIDER_SITE_OTHER): Payer: Medicare Other | Admitting: Urology

## 2018-09-08 DIAGNOSIS — N138 Other obstructive and reflux uropathy: Secondary | ICD-10-CM

## 2018-09-08 DIAGNOSIS — C61 Malignant neoplasm of prostate: Secondary | ICD-10-CM | POA: Diagnosis not present

## 2018-09-08 DIAGNOSIS — N401 Enlarged prostate with lower urinary tract symptoms: Secondary | ICD-10-CM

## 2018-09-08 NOTE — Telephone Encounter (Signed)
Discussed the [facility] Information form below over the phone with patient.    [address], [place], [place] [address] Telephone: [phone] Fax: [phone]   Thank you for choosing [facility] for your upcoming surgery!  We are always here to assist in your urological needs.  Please read the following information with specific details for your upcoming appointments related to your surgery. Please contact Zethan Alfieri at [phone] Option 3 with any questions.  The Name of Your Surgery: Holmium laser enucleation of prostate Your Surgery Date: 09/25/2018 Your Surgeon: Nickolas Madrid  Please call Same Day Surgery at [phone] between the hours of 1pm-3pm one day prior to your surgery. They will inform you of the time to arrive at Same Day Surgery which is located on the second floor of the [facility].   Please refer to the attached letter regarding instructions for Pre-Admission Testing. You will receive a call from the [place] office regarding your appointment with them.  The Pre-Admission Testing office is located at [address], on the first floor of the [facility] [facility] in [place] (office is to the right as you enter through the [employer] of the [facility]). Please have all medications you are currently taking and your insurance card available.  A COVID-19 test will be required prior to surgery and once test is performed you will need to remain in quarantine until the day of surgery. Patient was advised to have nothing to eat or drink after midnight the night prior to surgery except that he may have only water until 2 hours before surgery with nothing to drink within 2 hours of surgery.  The patient states he currently takes no blood thinners. Patient's questions were answered and he expressed  understanding of these instructions.

## 2018-09-08 NOTE — Progress Notes (Signed)
Virtual Visit via Telephone Note  I connected with Richard Duncan on 09/08/18 at  9:30 AM EDT by telephone and verified that I am speaking with the correct person using two identifiers.   I discussed the limitations, risks, security and privacy concerns of performing an evaluation and management service by telephone and the availability of in person appointments. We discussed the impact of the COVID-19 pandemic on the healthcare system, and the importance of social distancing and reducing patient and provider exposure. I also discussed with the patient that there may be a patient responsible charge related to this service. The patient expressed understanding and agreed to proceed.  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  To briefly summarize, he is a co-morbid 67-year-old male with a pacemaker and morbid obesity who I met on 08/11/2018 for the first time for cystoscopy for completion of gross hematuria work-up.  This was notable for a very large prostate with trilobar hyperplasia, a high bladder neck, moderate bladder trabeculations, multiple small stones in the bladder.  There is no evidence of bladder cancer.  He also has a history of very low risk prostate cancer diagnosed in 2010 with a PSA of 4, and 2/12 cores positive for Gleason score 3+3=6 low-volume disease.  He underwent a follow-up biopsy in 2014 that showed 1/12 cores for low-volume 3+3=6 disease.  His PSA has been extremely stable since that time, and most recent PSA in July 2020 was 2.96, which is actually lower from time of diagnosis in 2010 of 4.  He has very bothersome urinary symptoms of weak stream, straining, urgency, frequency, nocturia, and incontinence.  He failed a trial of Flomax previously.  He underwent urodynamics on 08/25/2018 which was notable for bladder capacity of 440 mL's, for sensation at  274 mils, normal desire at 335 mL's, and strong desire at 434 mils.  The bladder was mildly unstable with an unstable contraction at 3 of 6 mL's, and max unstable detrusor contraction of 11 cm of water.  He felt some increased urge during his instability but was able to inhibit leaking.  It clearly demonstrated bladder outlet obstruction with a voided volume of 73 mL with max flow of 6 mL/s, with elevated detrusor pressure at peak flow of 115 cm of water.  PVR was 365 mL's.  Assessment and Plan: In summary, the patient is a co-morbid and morbidly obese 67-year-old male with distant history of very low risk prostate cancer on active surveillance diagnosed in 2010 with a PSA of 4, and stable PSA with most recent value of 2.96 in July 2020, confirmed bladder outlet obstruction on urodynamics, enlarged prostate of 95 cc on recent CT, and bothersome obstructive urinary symptoms.  We had a very long conversation today about standard treatment options for prostate cancer versus BPH and outlet obstruction.  He is adamantly opposed to robotic prostatectomy secondary to the risk for erectile dysfunction, as well as the risks from abdominal surgery with his morbid obesity.  We discussed that HOLEP may be a good option for managing his urinary symptoms, and he could still get external beam radiation in the future if he had concerning findings on follow-up pathology, or PSA rose in the future.  However, his PSA has been stable for over 10 years, and he has very low risk disease confirmed on 2 biopsies.  We discussed at length that   HOLEP is not a surgery that treats prostate cancer, but would address his bladder outlet obstruction and urinary symptoms, as well as get tissue for pathology.  Finally, I discussed the anatomical challenges of HOLEP in patients with morbid obesity, and possible need for conversion to TURP.  I also stressed at length with him that his overactive symptoms may temporarily worsen after surgery,  including worsening incontinence, but that there is a very low rate of long-term incontinence after 3 to 6 months from surgery.  We discussed the risks and benefits of HOLEP at length.  The procedure requires general anesthesia and takes 2 to 3 hours, and a holmium laser is used to enucleate the prostate and push this tissue into the bladder.  A morcellator is then used to remove this tissue, which is sent for pathology.  Majority of patients are able to discharge the same day with a catheter in place for 2 to 3 days, and will follow-up in clinic for a voiding trial.  Approximately 10% of patients will be admitted overnight to monitor the urine, or if they have multiple comorbidities.  We specifically discussed the risks of bleeding, infection, retrograde ejaculation, temporary urgency and urge incontinence, very low risk of long-term incontinence, and possible need for additional procedures.  Follow Up:  Schedule HOLEP, needs cardiology clearance    I discussed the assessment and treatment plan with the patient. The patient was provided an opportunity to ask questions and all were answered. The patient agreed with the plan and demonstrated an understanding of the instructions.   The patient was advised to call back or seek an in-person evaluation if the symptoms worsen or if the condition fails to improve as anticipated.  I provided 15 minutes of non-face-to-face time during this encounter.   Billey Co, MD

## 2018-09-09 ENCOUNTER — Other Ambulatory Visit: Payer: Self-pay | Admitting: Radiology

## 2018-09-09 DIAGNOSIS — N138 Other obstructive and reflux uropathy: Secondary | ICD-10-CM

## 2018-09-15 ENCOUNTER — Other Ambulatory Visit
Admission: RE | Admit: 2018-09-15 | Discharge: 2018-09-15 | Disposition: A | Discharge status: Home / Self Care | Payer: Medicare Other | Attending: Urology | Admitting: Urology

## 2018-09-15 ENCOUNTER — Other Ambulatory Visit: Payer: Self-pay

## 2018-09-15 DIAGNOSIS — N401 Enlarged prostate with lower urinary tract symptoms: Secondary | ICD-10-CM | POA: Diagnosis present

## 2018-09-15 DIAGNOSIS — N138 Other obstructive and reflux uropathy: Secondary | ICD-10-CM | POA: Diagnosis present

## 2018-09-15 LAB — URINALYSIS, COMPLETE (UACMP) WITH MICROSCOPIC
Bacteria, UA: NONE SEEN
Bilirubin Urine: NEGATIVE
Glucose, UA: NEGATIVE mg/dL
Ketones, ur: NEGATIVE mg/dL
Leukocytes,Ua: NEGATIVE
Nitrite: NEGATIVE
Protein, ur: NEGATIVE mg/dL
Specific Gravity, Urine: 1.02 (ref 1.005–1.030)
Squamous Epithelial / LPF: NONE SEEN (ref 0–5)
WBC, UA: NONE SEEN WBC/hpf (ref 0–5)
pH: 5.5 (ref 5.0–8.0)

## 2018-09-16 ENCOUNTER — Encounter
Admission: RE | Admit: 2018-09-16 | Discharge: 2018-09-16 | Disposition: A | Discharge status: Home / Self Care | Payer: Medicare Other | Source: Ambulatory Visit | Attending: Urology | Admitting: Urology

## 2018-09-16 ENCOUNTER — Other Ambulatory Visit: Payer: Self-pay

## 2018-09-16 DIAGNOSIS — I10 Essential (primary) hypertension: Secondary | ICD-10-CM | POA: Diagnosis not present

## 2018-09-16 DIAGNOSIS — Z950 Presence of cardiac pacemaker: Secondary | ICD-10-CM | POA: Diagnosis not present

## 2018-09-16 DIAGNOSIS — Z01810 Encounter for preprocedural cardiovascular examination: Secondary | ICD-10-CM | POA: Insufficient documentation

## 2018-09-16 DIAGNOSIS — R9431 Abnormal electrocardiogram [ECG] [EKG]: Secondary | ICD-10-CM | POA: Insufficient documentation

## 2018-09-16 HISTORY — DX: Cardiac arrhythmia, unspecified: I49.9

## 2018-09-16 HISTORY — DX: Chronic obstructive pulmonary disease, unspecified: J44.9

## 2018-09-16 LAB — URINE CULTURE: Culture: NO GROWTH

## 2018-09-16 NOTE — Patient Instructions (Signed)
Your procedure is scheduled on: 09/25/18 Report to Day Surgery.[facility]LO[facility] To find out your arrival time please call [phone] between 1PM - 3PM on 09/24/18.  Remember: Instructions that are not followed completely may result in serious medical risk,  up to and including death, or upon the discretion of your surgeon and anesthesiologist your  surgery may need to be rescheduled.     _X__ 1. Do not eat food after midnight the night before your procedure.                 No gum chewing or hard candies. You may drink clear liquids up to 2 hours                 before you are scheduled to arrive for your surgery- DO not drink clear                 liquids within 2 hours of the start of your surgery.                 Clear Liquids include:  water, apple juice without pulp, clear carbohydrate                 drink such as Clearfast of Gatorade, Black Coffee or Tea (Do not add                 anything to coffee or tea).  __X__2.  On the morning of surgery brush your teeth with toothpaste and water, you                may rinse your mouth with mouthwash if you wish.  Do not swallow any toothpaste of mouthwash.     _X__ 3.  No Alcohol for 24 hours before or after surgery.   _X__ 4.  Do Not Smoke or use e-cigarettes For 24 Hours Prior to Your Surgery.                 Do not use any chewable tobacco products for at least 6 hours prior to                 surgery.  ____  5.  Bring all medications with you on the day of surgery if instructed.   __X__  6.  Notify your doctor if there is any change in your medical condition      (cold, fever, infections).     Do not wear jewelry, make-up, hairpins, clips or nail polish. Do not wear lotions, powders, or perfumes. You may wear deodorant. Do not shave 48 hours prior to surgery. Men may shave face and neck. Do not bring valuables to the hospital.    [facility] is not responsible for any belongings or  valuables.  Contacts, dentures or bridgework may not be worn into surgery. Leave your suitcase in the car. After surgery it may be brought to your room. For patients admitted to the hospital, discharge time is determined by your treatment team.   Patients discharged the day of surgery will not be allowed to drive home.           __X__ Take these medicines the morning of surgery with A SIP OF WATER:    1. AMLODIPINE  2. CELEXA  3. HYDRALAZINE  4.METPROLOL  5.  6.  ____ Fleet Enema (as directed)   ____ Use CHG Soap as directed  __X__ Use inhalers on the day of surgery AND BRING  ____  Stop metformin 2 days prior to surgery    ____ Take 1/2 of usual insulin dose the night before surgery. No insulin the morning          of surgery.   ____ Stop Coumadin/Plavix/aspirin on   ____ Stop Anti-inflammatories on    ____ Stop supplements until after surgery.    __X__ Bring C-Pap to the hospital.

## 2018-09-22 ENCOUNTER — Other Ambulatory Visit: Payer: Self-pay

## 2018-09-22 ENCOUNTER — Other Ambulatory Visit
Admission: RE | Admit: 2018-09-22 | Discharge: 2018-09-22 | Disposition: A | Discharge status: Home / Self Care | Payer: Medicare Other | Source: Ambulatory Visit | Attending: Urology | Admitting: Urology

## 2018-09-22 DIAGNOSIS — Z01812 Encounter for preprocedural laboratory examination: Secondary | ICD-10-CM | POA: Insufficient documentation

## 2018-09-22 DIAGNOSIS — N401 Enlarged prostate with lower urinary tract symptoms: Secondary | ICD-10-CM | POA: Insufficient documentation

## 2018-09-22 DIAGNOSIS — Z20828 Contact with and (suspected) exposure to other viral communicable diseases: Secondary | ICD-10-CM | POA: Insufficient documentation

## 2018-09-22 LAB — SARS CORONAVIRUS 2 (TAT 6-24 HRS): SARS Coronavirus 2: NEGATIVE

## 2018-09-23 NOTE — Pre-Procedure Instructions (Signed)
Copy of pacemaker form re-faxed to [facility] cardiology.

## 2018-09-25 ENCOUNTER — Ambulatory Visit: Payer: Medicare Other | Admitting: Certified Registered"

## 2018-09-25 ENCOUNTER — Ambulatory Visit
Admission: RE | Admit: 2018-09-25 | Discharge: 2018-09-25 | Disposition: A | Discharge status: Home / Self Care | Payer: Medicare Other | Source: Ambulatory Visit | Attending: Urology | Admitting: Urology

## 2018-09-25 ENCOUNTER — Encounter: Payer: Self-pay | Admitting: Anesthesiology

## 2018-09-25 ENCOUNTER — Other Ambulatory Visit: Payer: Self-pay

## 2018-09-25 ENCOUNTER — Encounter
Admission: RE | Disposition: A | Discharge status: Home / Self Care | Payer: Self-pay | Source: Ambulatory Visit | Attending: Urology

## 2018-09-25 DIAGNOSIS — Z8042 Family history of malignant neoplasm of prostate: Secondary | ICD-10-CM | POA: Insufficient documentation

## 2018-09-25 DIAGNOSIS — N401 Enlarged prostate with lower urinary tract symptoms: Secondary | ICD-10-CM | POA: Insufficient documentation

## 2018-09-25 DIAGNOSIS — N138 Other obstructive and reflux uropathy: Secondary | ICD-10-CM | POA: Insufficient documentation

## 2018-09-25 DIAGNOSIS — G4733 Obstructive sleep apnea (adult) (pediatric): Secondary | ICD-10-CM | POA: Insufficient documentation

## 2018-09-25 DIAGNOSIS — Z6841 Body Mass Index (BMI) 40.0 and over, adult: Secondary | ICD-10-CM | POA: Diagnosis not present

## 2018-09-25 DIAGNOSIS — Z8546 Personal history of malignant neoplasm of prostate: Secondary | ICD-10-CM | POA: Diagnosis not present

## 2018-09-25 DIAGNOSIS — J449 Chronic obstructive pulmonary disease, unspecified: Secondary | ICD-10-CM | POA: Diagnosis not present

## 2018-09-25 DIAGNOSIS — Z96643 Presence of artificial hip joint, bilateral: Secondary | ICD-10-CM | POA: Insufficient documentation

## 2018-09-25 DIAGNOSIS — Z884 Allergy status to anesthetic agent status: Secondary | ICD-10-CM | POA: Diagnosis not present

## 2018-09-25 DIAGNOSIS — K449 Diaphragmatic hernia without obstruction or gangrene: Secondary | ICD-10-CM | POA: Insufficient documentation

## 2018-09-25 DIAGNOSIS — Z87891 Personal history of nicotine dependence: Secondary | ICD-10-CM | POA: Insufficient documentation

## 2018-09-25 DIAGNOSIS — Z950 Presence of cardiac pacemaker: Secondary | ICD-10-CM | POA: Insufficient documentation

## 2018-09-25 DIAGNOSIS — Z888 Allergy status to other drugs, medicaments and biological substances status: Secondary | ICD-10-CM | POA: Diagnosis not present

## 2018-09-25 DIAGNOSIS — M1990 Unspecified osteoarthritis, unspecified site: Secondary | ICD-10-CM | POA: Diagnosis not present

## 2018-09-25 DIAGNOSIS — I10 Essential (primary) hypertension: Secondary | ICD-10-CM | POA: Diagnosis not present

## 2018-09-25 DIAGNOSIS — N320 Bladder-neck obstruction: Secondary | ICD-10-CM

## 2018-09-25 DIAGNOSIS — G629 Polyneuropathy, unspecified: Secondary | ICD-10-CM | POA: Insufficient documentation

## 2018-09-25 HISTORY — PX: HOLEP-LASER ENUCLEATION OF THE PROSTATE WITH MORCELLATION: SHX6641

## 2018-09-25 SURGERY — ENUCLEATION, PROSTATE, USING LASER, WITH MORCELLATION
Anesthesia: General | Site: Prostate

## 2018-09-25 MED ORDER — LIDOCAINE HCL (CARDIAC) PF 100 MG/5ML IV SOSY
PREFILLED_SYRINGE | INTRAVENOUS | Status: DC | PRN
  Administered 2018-09-25: 100 mg via INTRAVENOUS

## 2018-09-25 MED ORDER — ROCURONIUM BROMIDE 50 MG/5ML IV SOLN
INTRAVENOUS | Status: AC
  Filled 2018-09-25: qty 1

## 2018-09-25 MED ORDER — BELLADONNA ALKALOIDS-OPIUM 16.2-60 MG RE SUPP
RECTAL | Status: AC
  Filled 2018-09-25: qty 1

## 2018-09-25 MED ORDER — PHENYLEPHRINE HCL (PRESSORS) 10 MG/ML IV SOLN
INTRAVENOUS | Status: DC | PRN
  Administered 2018-09-25: 200 ug via INTRAVENOUS
  Administered 2018-09-25: 100 ug via INTRAVENOUS

## 2018-09-25 MED ORDER — SUCCINYLCHOLINE CHLORIDE 20 MG/ML IJ SOLN
INTRAMUSCULAR | Status: DC | PRN
  Administered 2018-09-25: 140 mg via INTRAVENOUS

## 2018-09-25 MED ORDER — SUCCINYLCHOLINE CHLORIDE 20 MG/ML IJ SOLN
INTRAMUSCULAR | Status: AC
  Filled 2018-09-25: qty 1

## 2018-09-25 MED ORDER — HEPARIN SODIUM (PORCINE) 5000 UNIT/ML IJ SOLN
INTRAMUSCULAR | Status: AC
  Filled 2018-09-25: qty 1

## 2018-09-25 MED ORDER — ONDANSETRON HCL 4 MG/2ML IJ SOLN
INTRAMUSCULAR | Status: AC
  Filled 2018-09-25: qty 2

## 2018-09-25 MED ORDER — PROPOFOL 10 MG/ML IV BOLUS
INTRAVENOUS | Status: AC
  Filled 2018-09-25: qty 40

## 2018-09-25 MED ORDER — FENTANYL CITRATE (PF) 100 MCG/2ML IJ SOLN: 25.0000 ug | INTRAMUSCULAR | Status: DC | PRN

## 2018-09-25 MED ORDER — SUGAMMADEX SODIUM 200 MG/2ML IV SOLN
INTRAVENOUS | Status: AC
  Filled 2018-09-25: qty 4

## 2018-09-25 MED ORDER — FAMOTIDINE 20 MG PO TABS
ORAL_TABLET | ORAL | Status: AC
  Administered 2018-09-25: 20 mg via ORAL
  Filled 2018-09-25: qty 1

## 2018-09-25 MED ORDER — HYDROCODONE-ACETAMINOPHEN 5-325 MG PO TABS: 1.0000 | ORAL_TABLET | ORAL | 0 refills | Status: AC | PRN

## 2018-09-25 MED ORDER — FENTANYL CITRATE (PF) 100 MCG/2ML IJ SOLN
INTRAMUSCULAR | Status: AC
  Filled 2018-09-25: qty 2

## 2018-09-25 MED ORDER — SUGAMMADEX SODIUM 500 MG/5ML IV SOLN
INTRAVENOUS | Status: DC | PRN
  Administered 2018-09-25: 300 mg via INTRAVENOUS

## 2018-09-25 MED ORDER — PHENYLEPHRINE HCL (PRESSORS) 10 MG/ML IV SOLN
INTRAVENOUS | Status: AC
  Filled 2018-09-25: qty 1

## 2018-09-25 MED ORDER — FENTANYL CITRATE (PF) 100 MCG/2ML IJ SOLN
INTRAMUSCULAR | Status: DC | PRN
  Administered 2018-09-25 (×2): 50 ug via INTRAVENOUS
  Administered 2018-09-25: 100 ug via INTRAVENOUS

## 2018-09-25 MED ORDER — LACTATED RINGERS IV SOLN
INTRAVENOUS | Status: DC
  Administered 2018-09-25 (×2): via INTRAVENOUS

## 2018-09-25 MED ORDER — BELLADONNA ALKALOIDS-OPIUM 16.2-60 MG RE SUPP
RECTAL | Status: DC | PRN
  Administered 2018-09-25: 1 via RECTAL

## 2018-09-25 MED ORDER — CIPROFLOXACIN IN D5W 400 MG/200ML IV SOLN
INTRAVENOUS | Status: AC
  Filled 2018-09-25: qty 200

## 2018-09-25 MED ORDER — CIPROFLOXACIN IN D5W 400 MG/200ML IV SOLN
400.0000 mg | INTRAVENOUS | Status: AC
  Administered 2018-09-25: 400 mg via INTRAVENOUS

## 2018-09-25 MED ORDER — ONDANSETRON HCL 4 MG/2ML IJ SOLN
INTRAMUSCULAR | Status: DC | PRN
  Administered 2018-09-25: 4 mg via INTRAVENOUS

## 2018-09-25 MED ORDER — SUGAMMADEX SODIUM 500 MG/5ML IV SOLN
INTRAVENOUS | Status: AC
  Filled 2018-09-25: qty 5

## 2018-09-25 MED ORDER — DEXAMETHASONE SODIUM PHOSPHATE 10 MG/ML IJ SOLN
INTRAMUSCULAR | Status: DC | PRN
  Administered 2018-09-25: 5 mg via INTRAVENOUS

## 2018-09-25 MED ORDER — EPHEDRINE SULFATE 50 MG/ML IJ SOLN
INTRAMUSCULAR | Status: DC | PRN
  Administered 2018-09-25 (×3): 10 mg via INTRAVENOUS

## 2018-09-25 MED ORDER — ROCURONIUM BROMIDE 100 MG/10ML IV SOLN
INTRAVENOUS | Status: DC | PRN
  Administered 2018-09-25: 10 mg via INTRAVENOUS
  Administered 2018-09-25: 40 mg via INTRAVENOUS

## 2018-09-25 MED ORDER — PROPOFOL 10 MG/ML IV BOLUS
INTRAVENOUS | Status: DC | PRN
  Administered 2018-09-25: 200 mg via INTRAVENOUS
  Administered 2018-09-25: 100 mg via INTRAVENOUS

## 2018-09-25 MED ORDER — FAMOTIDINE 20 MG PO TABS
20.0000 mg | ORAL_TABLET | Freq: Once | ORAL | Status: AC
  Administered 2018-09-25: 08:00:00 20 mg via ORAL

## 2018-09-25 MED ORDER — PROMETHAZINE HCL 25 MG/ML IJ SOLN: 6.2500 mg | INTRAMUSCULAR | Status: DC | PRN

## 2018-09-25 SURGICAL SUPPLY — 35 items
ADAPTER IRRIG TUBE 2 SPIKE SOL (ADAPTER) ×6 IMPLANT
BAG URO DRAIN 4000ML (MISCELLANEOUS) ×3 IMPLANT
CATH FOLEY 3WAY 30CC 24FR (CATHETERS) ×2
CATH URETL 5X70 OPEN END (CATHETERS) ×3 IMPLANT
CATH URTH STD 24FR FL 3W 2 (CATHETERS) ×1 IMPLANT
CONTAINER COLLECT MORCELLATR (MISCELLANEOUS) ×1 IMPLANT
DRAPE 3/4 80X56 (DRAPES) ×3 IMPLANT
DRAPE UTILITY 15X26 TOWEL STRL (DRAPES) ×2 IMPLANT
ELECT BIVAP BIPO 22/24 DONUT (ELECTROSURGICAL) ×3
ELECTRD BIVAP BIPO 22/24 DONUT (ELECTROSURGICAL) IMPLANT
FILTER OVERFLOW MORCELLATOR (FILTER) ×1 IMPLANT
GLOVE BIOGEL PI IND STRL 7.5 (GLOVE) ×1 IMPLANT
GLOVE BIOGEL PI INDICATOR 7.5 (GLOVE) ×2
GOWN STRL REUS W/ TWL LRG LVL3 (GOWN DISPOSABLE) ×1 IMPLANT
GOWN STRL REUS W/ TWL XL LVL3 (GOWN DISPOSABLE) ×1 IMPLANT
GOWN STRL REUS W/TWL LRG LVL3 (GOWN DISPOSABLE) ×2
GOWN STRL REUS W/TWL XL LVL3 (GOWN DISPOSABLE) ×2
GUIDEWIRE GREEN .038 145CM (MISCELLANEOUS) ×2 IMPLANT
GUIDEWIRE STR DUAL SENSOR (WIRE) IMPLANT
HOLDER FOLEY CATH W/STRAP (MISCELLANEOUS) ×3 IMPLANT
KIT TURNOVER CYSTO (KITS) ×3 IMPLANT
LASER FIBER 550M SMARTSCOPE (Laser) ×3 IMPLANT
MORCELLATOR COLLECT CONTAINER (MISCELLANEOUS) ×3
MORCELLATOR OVERFLOW FILTER (FILTER) ×3
MORCELLATOR ROTATION 4.75 335 (MISCELLANEOUS) ×3 IMPLANT
PACK CYSTO AR (MISCELLANEOUS) ×3 IMPLANT
SET CYSTO W/LG BORE CLAMP LF (SET/KITS/TRAYS/PACK) ×3 IMPLANT
SET IRRIG Y TYPE TUR BLADDER L (SET/KITS/TRAYS/PACK) ×3 IMPLANT
SLEEVE PROTECTION STRL DISP (MISCELLANEOUS) ×6 IMPLANT
SOL .9 NS 3000ML IRR  AL (IV SOLUTION) ×24
SOL .9 NS 3000ML IRR UROMATIC (IV SOLUTION) ×4 IMPLANT
SURGILUBE 2OZ TUBE FLIPTOP (MISCELLANEOUS) ×3 IMPLANT
SYRINGE IRR TOOMEY STRL 70CC (SYRINGE) ×3 IMPLANT
TUBE PUMP MORCELLATOR PIRANHA (TUBING) ×3 IMPLANT
WATER STERILE IRR 1000ML POUR (IV SOLUTION) ×3 IMPLANT

## 2018-09-25 NOTE — Anesthesia Post-op Follow-up Note (Signed)
Anesthesia QCDR form completed.        

## 2018-09-25 NOTE — Anesthesia Procedure Notes (Signed)
Procedure Name: Intubation Date/Time: 09/25/2018 10:14 AM Performed by: Chanetta Marshall, CRNA Pre-anesthesia Checklist: Patient identified, Emergency Drugs available, Suction available and Patient being monitored Patient Re-evaluated:Patient Re-evaluated prior to induction Oxygen Delivery Method: Circle system utilized Preoxygenation: Pre-oxygenation with 100% oxygen Induction Type: IV induction Ventilation: Mask ventilation with difficulty Laryngoscope Size: McGraph and 4 Tube type: Oral Number of attempts: 1 Airway Equipment and Method: Stylet,  Oral airway and Video-laryngoscopy Placement Confirmation: ETT inserted through vocal cords under direct vision,  positive ETCO2,  breath sounds checked- equal and bilateral and CO2 detector Secured at: 23 cm Tube secured with: Tape Dental Injury: Teeth and Oropharynx as per pre-operative assessment  Difficulty Due To: Difficulty was anticipated

## 2018-09-25 NOTE — Pre-Procedure Instructions (Signed)
Pacemaker form received and faxed to [employer] to be placed on chart. Original fax copy to medical records.

## 2018-09-25 NOTE — Op Note (Signed)
Date of procedure: 09/25/18  Preoperative diagnosis:  1. BPH with obstruction 2. 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 Same  Procedure: 1. HOLEP  Surgeon: Nickolas Madrid, MD  Anesthesia: General  Complications: None  Intraoperative findings:  1.  Massive prostate with high bladder neck and very large median lobe  EBL: 75cc  Specimens: Prostate chips for permanent  Drains: 24 French three-way, 50 cc in balloon  Indication: Richard Duncan is a 68 y.o. patient with low risk prostate cancer for 10 years with stable PSA on active surveillance and worsening obstructive urinary symptoms secondary to BPH.  CT showed a 90 g prostate with massive median lobe.  He refused robotic prostatectomy or radiation, and after an extensive discussion of risks and benefits he elected to proceed with a HOLEP for urodynamic demonstrated bladder outlet obstruction.  After reviewing the management options for treatment, they elected to proceed with the above surgical procedure(s). We have discussed the potential benefits and risks of the procedure, side effects of the proposed treatment, the likelihood of the patient achieving the goals of the procedure, and any potential problems that might occur during the procedure or recuperation. Informed consent has been obtained.  Description of procedure:  The patient was taken to the operating room and general anesthesia was induced.  The patient was placed in the dorsal lithotomy position, prepped and draped in the usual sterile fashion, and preoperative antibiotics were administered.  SCDs were placed and 5000 units subcutaneous heparin were given for DVT prophylaxis.  A preoperative time-out was performed.   The 41 French continuous flow resectoscope was inserted into the urethra using the visual obturator  The prostate was massive with a high bladder neck and a very large median lobe with intravesical protrusion. The  bladder was thoroughly inspected and notable for moderate bladder trabeculations.  The ureteral orifices were located in orthotopic position.  The laser was set to 2 J and 50 Hz and was used to make an incision at the 5 and 7 o'clock positions to the level of the capsule from the bladder neck to the verumontanum.  The median lobe was enucleated and pushed into the bladder.  There was a fair amount of oozing from the mucosa of the lateral lobes.  The capsule was examined and laser was used for meticulous hemostasis.  At this point I looked up to the 12 o'clock position and realized that I would be unable to make an anterior incision because with his obesity and massive scrotum the laser and resectoscope would not reach the anterior prostate.  He did appear to have a wide open channel, and I suspect his urinary symptoms will improve significantly just with removal of the median lobe.  The 20 French resectoscope was then switched out for the 31 French nephroscope and the lobe was morcellated and the tissue sent to pathology.    Secondary to some ongoing oozing from the fossa, I elected to place a resectoscope under direct vision.  The bipolar button was then used to methodically obtain excellent hemostasis.  I was unable to pass a 24 [place] three-way into the bladder and met resistance at the high bladder neck.  I was also unable to pass a catheter with the assistance of a catheter guide.  Under direct vision with the resectoscope a Super Stiff wire was passed in the bladder, the 24 [place] three-way was turned into a Council catheter by removing the tip, and the catheter passed easily  over the wire into the bladder with return of pink fluid.  50 cc were placed in the balloon.  Urine was clear.  The Foley irrigated easily with normal saline.  The patient tolerated the procedure well without any immediate complications and was extubated and transferred to the recovery room in stable condition.  Urine was clear on  fast CBI.  Disposition: Stable to PACU  Plan: Wean CBI in PACU, anticipate discharge home today with void trial in clinic in 2-3 days If refractory urinary symptoms could consider prostatic artery embolization in the future  Nickolas Madrid, MD 09/25/2018

## 2018-09-25 NOTE — Discharge Instructions (Signed)

## 2018-09-25 NOTE — Transfer of Care (Signed)
Immediate Anesthesia Transfer of Care Note  Patient: Richard Duncan  Procedure(s) Performed: HOLEP-LASER ENUCLEATION OF THE PROSTATE WITH MORCELLATION (N/A Prostate)  Patient Location: PACU  Anesthesia Type:General  Level of Consciousness: awake, alert  and oriented  Airway & Oxygen Therapy: Patient Spontanous Breathing and Patient connected to nasal cannula oxygen  Post-op Assessment: Report given to RN and Post -op Vital signs reviewed and stable  Post vital signs: Reviewed and stable  Last Vitals:  Vitals Value Taken Time  BP    Temp    Pulse    Resp    SpO2      Last Pain:  Vitals:   09/25/18 0758  TempSrc: Tympanic  PainSc: 8          Complications: No apparent anesthesia complications

## 2018-09-25 NOTE — H&P (Signed)
09/25/18 9:36 AM   Richard Duncan May 09, 1950 [id number]  HPI: He is a co-morbid 68 year old male with a pacemaker and morbid obesity who I met on 08/11/2018 for the first time for cystoscopy for completion of gross hematuria work-up.  This was notable for a very large prostate with trilobar hyperplasia, a high bladder neck, moderate bladder trabeculations, multiple small stones in the bladder.  There is no evidence of bladder cancer.  He also has a history of very low risk prostate cancer diagnosed in 2010 with a PSA of 4, and 2/12 cores positive for Gleason score 3+3=6 low-volume disease.  He underwent a follow-up biopsy in 2014 that showed 1/12 cores for low-volume 3+3=6 disease.  His PSA has been extremely stable since that time, and most recent PSA in July 2020 was 2.96, which is actually lower from time of diagnosis in 2010 of 4.  He has very bothersome urinary symptoms of weak stream, straining, urgency, frequency, nocturia, and incontinence.  He failed a trial of Flomax previously.  He underwent urodynamics on 08/25/2018 which was notable for bladder capacity of 440 mL's, first sensation at 274 mL, normal desire at 335 mL's, and strong desire at 434 mls.  The bladder was mildly unstable with an unstable contraction at 306 mL's, and max unstable detrusor contraction of 11 cm of water.  He felt some increased urge during his instability but was able to inhibit leaking.  It clearly demonstrated bladder outlet obstruction with a voided volume of 73 mL with max flow of 6 mL/s, with elevated detrusor pressure at peak flow of 115 cm of water.  PVR was 365 mL's.  He has elected to undergo HoLEP for management.   PMH: Past Medical History:  Diagnosis Date   Anemia    Anxiety    Arthritis    Arthropathia 11/08/2013   Bilateral carpal tunnel syndrome    Cancer ([place]) 2011   Prostate Ca   Carpal tunnel syndrome    Complication of anesthesia    urinary retention with Popanol   COPD  (chronic obstructive pulmonary disease) ([place])    Depression    Difficult intubation    Dyspnea    RECENT-SUPPOSED TO HAVE STRESS TEST 09-26-16 WITH DR Ubaldo Glassing BUT CANCELLED DUE TO STENT PLACEMENT ON 09-27-16   Dysrhythmia    ED (erectile dysfunction)    GERD (gastroesophageal reflux disease)    rare   Hernia, hiatal    History of blood product transfusion    History of hiatal hernia    History of kidney stones    Hyperlipemia    Hypertension    Neuropathy    Neuropathy    Obesity    Over weight    Presence of permanent cardiac pacemaker    Medtronic   Prostatitis    PTSD (post-traumatic stress disorder)    Sleep apnea    OSA--use Bi-PAP   Urinary retention     Surgical History: Past Surgical History:  Procedure Laterality Date   [place], 2006   Lumbar Spine   BACK SURGERY     x2 Laminectomy   CARDIAC PACEMAKER PLACEMENT     CERVICAL FUSION     CERVICAL SPINE SURGERY N/A 2008   COLONOSCOPY     CYSTOSCOPY W/ URETERAL STENT PLACEMENT Bilateral 11/01/2016   Procedure: CYSTOSCOPY WITH STENT REPLACEMENT;  Surgeon: [name], MD;  Location: [facility];  Service: Urology;  Laterality: Bilateral;   CYSTOSCOPY W/ URETERAL  STENT PLACEMENT Left 12/10/2016   Procedure: CYSTOSCOPY WITH STENT REPLACEMENT;  Surgeon: Abbie Sons, MD;  Location: [facility];  Service: Urology;  Laterality: Left;   CYSTOSCOPY W/ URETERAL STENT REMOVAL Bilateral 11/27/2016   Procedure: CYSTOSCOPY WITH STENT REMOVAL/EXCHANGE;  Surgeon: [name], MD;  Location: [facility];  Service: Urology;  Laterality: Bilateral;   CYSTOSCOPY WITH STENT PLACEMENT Bilateral 09/27/2016   Procedure: CYSTOSCOPY WITH STENT PLACEMENT;  Surgeon: [name], MD;  Location: [facility];  Service: Urology;  Laterality: Bilateral;   EXCISION OF ABDOMINAL WALL TUMOR N/A 03/08/2016   Procedure: EXCISION OF ABDOMINAL WALL MASS;  Surgeon: Leonie Green,  MD;  Location: [facility];  Service: General;  Laterality: N/A;   JOINT REPLACEMENT  2004, 2005, 2014, 2015   Bilat Hip Replacements   SPINAL CORD STIMULATOR INSERTION N/A 07/08/2014   Procedure: LUMBAR SPINAL CORD STIMULATOR INSERTION;  Surgeon: Clydell Hakim, MD;  Location: [place];  Service: Neurosurgery;  Laterality: N/A;  LUMBAR SPINAL CORD STIMULATOR INSERTION   TONSILLECTOMY     URETEROSCOPY WITH HOLMIUM LASER LITHOTRIPSY Bilateral 11/01/2016   Procedure: URETEROSCOPY WITH HOLMIUM LASER LITHOTRIPSY;  Surgeon: [name], MD;  Location: [facility];  Service: Urology;  Laterality: Bilateral;   URETEROSCOPY WITH HOLMIUM LASER LITHOTRIPSY Left 12/10/2016   Procedure: URETEROSCOPY WITH HOLMIUM LASER LITHOTRIPSY;  Surgeon: Abbie Sons, MD;  Location: [facility];  Service: Urology;  Laterality: Left;   URETEROSCOPY WITH HOLMIUM LASER LITHOTRIPSY Left 11/27/2016   Procedure: URETEROSCOPY WITH HOLMIUM LASER LITHOTRIPSY;  Surgeon: [name], MD;  Location: [facility];  Service: Urology;  Laterality: Left;     Allergies:  Allergies  Allergen Reactions   Propofol Other (See Comments)    URINARY RETENTION REQUIRING CATHETERIZATION   Propranolol Other (See Comments)    Urinary retention    Family History: Family History  Problem Relation Age of Onset   Benign prostatic hyperplasia Father    Prostate cancer Father    Hypertension Mother    Kidney disease Neg Hx    Kidney cancer Neg Hx    Bladder Cancer Neg Hx     Social History:  reports that he quit smoking about 52 years ago. His smoking use included cigarettes. He quit after 2.00 years of use. He has never used smokeless tobacco. He reports current alcohol use. He reports that he does not use drugs.  ROS: Please see flowsheet from today's date for complete review of systems.  Physical Exam: BP (!) 156/81    Pulse 79    Temp 97.9 F (36.6 C) (Tympanic)    Resp 14    Wt (!) 163.4 kg    SpO2 99%    BMI 47.52  kg/m    Constitutional:  Alert and oriented, No acute distress. Cardiovascular: RRR Respiratory: CTA bilaterally GI: Abdomen is soft, nontender, nondistended, no abdominal masses Lymph: No cervical or inguinal lymphadenopathy. Skin: No rashes, bruises or suspicious lesions. Neurologic: Grossly intact, no focal deficits, moving all 4 extremities. Psychiatric: Normal mood and affect.  Laboratory Data: Urine culture 8/18 no growth  Assessment and Plan: In summary, the patient is a co-morbid and morbidly obese 68 year old male with distant history of very low risk prostate cancer on active surveillance diagnosed in 2010 with a PSA of 4, and stable PSA with most recent value of 2.96 in July 2020, confirmed bladder outlet obstruction on urodynamics, enlarged prostate of 95 cc on recent CT, and bothersome obstructive urinary symptoms.  We had a  very long conversation today about standard treatment options for prostate cancer versus BPH and outlet obstruction.  He is adamantly opposed to robotic prostatectomy secondary to the risk for erectile dysfunction, as well as the risks from abdominal surgery with his morbid obesity.  We discussed that HOLEP may be a good option for managing his urinary symptoms, and he could still get external beam radiation in the future if he had concerning findings on follow-up pathology, or PSA rose in the future.  However, his PSA has been stable for over 10 years, and he has very low risk disease confirmed on 2 biopsies.  We discussed at length that HOLEP is not a surgery that treats prostate cancer, but would address his bladder outlet obstruction and urinary symptoms, as well as get tissue for pathology.  Finally, I discussed the anatomical challenges of HOLEP in patients with morbid obesity, and possible need for conversion to TURP.  I also stressed at length with him that his overactive symptoms may temporarily worsen after surgery, including worsening incontinence, but  that there is a very low rate of long-term incontinence after 3 to 6 months from surgery.  We discussed the risks and benefits of HOLEP at length.  The procedure requires general anesthesia and takes 2 to 3 hours, and a holmium laser is used to enucleate the prostate and push this tissue into the bladder.  A morcellator is then used to remove this tissue, which is sent for pathology.  Majority of patients are able to discharge the same day with a catheter in place for 2 to 3 days, and will follow-up in clinic for a voiding trial.  Approximately 10% of patients will be admitted overnight to monitor the urine, or if they have multiple comorbidities.  We specifically discussed the risks of bleeding, infection, retrograde ejaculation, temporary urgency and urge incontinence, very low risk of long-term incontinence, and possible need for additional procedures.  Billey Co, [facility] [address], [place], [place] [address] [phone]

## 2018-09-25 NOTE — Anesthesia Preprocedure Evaluation (Addendum)
Anesthesia Evaluation   Patient awake    Reviewed: Allergy & Precautions, NPO status , Patient's Chart, lab work & pertinent test results  History of Anesthesia Complications (+) DIFFICULT AIRWAY and history of anesthetic complications  Airway Mallampati: III       Dental  (+) Dental Advidsory Given, Missing, Poor Dentition   Pulmonary shortness of breath and with exertion, sleep apnea and Continuous Positive Airway Pressure Ventilation , COPD,  COPD inhaler, neg recent URI, former smoker,           Cardiovascular hypertension, Pt. on medications (-) angina(-) Past MI and (-) Cardiac Stents + dysrhythmias (symptomatic bradycardia) + pacemaker (-) Valvular Problems/Murmurs     Neuro/Psych neg Seizures PSYCHIATRIC DISORDERS Anxiety Depression  Neuromuscular disease    GI/Hepatic Neg liver ROS, hiatal hernia, GERD  Medicated and Controlled,  Endo/Other  neg diabetesMorbid obesity  Renal/GU Renal InsufficiencyRenal disease     Musculoskeletal   Abdominal   Peds  Hematology  (+) Blood dyscrasia, anemia ,   Anesthesia Other Findings Past Medical History: No date: Anemia No date: Anxiety No date: Arthritis 11/08/2013: Arthropathia No date: Bilateral carpal tunnel syndrome 2011: Cancer [facility])     Comment:  Prostate Ca No date: Carpal tunnel syndrome No date: Complication of anesthesia     Comment:  urinary retention with Popanol No date: COPD (chronic obstructive pulmonary disease) ([facility]) No date: Depression No date: Difficult intubation No date: Dyspnea     Comment:  RECENT-SUPPOSED TO HAVE STRESS TEST 09-26-16 WITH DR Ubaldo Glassing              BUT CANCELLED DUE TO STENT PLACEMENT ON 09-27-16 No date: Dysrhythmia No date: ED (erectile dysfunction) No date: GERD (gastroesophageal reflux disease)     Comment:  rare No date: Hernia, hiatal No date: History of blood product transfusion No date: History of hiatal hernia No  date: History of kidney stones No date: Hyperlipemia No date: Hypertension No date: Neuropathy No date: Neuropathy No date: Obesity No date: Over weight No date: Presence of permanent cardiac pacemaker     Comment:  Medtronic No date: Prostatitis No date: PTSD (post-traumatic stress disorder) No date: Sleep apnea     Comment:  OSA--use Bi-PAP No date: Urinary retention   Reproductive/Obstetrics                           Anesthesia Physical  Anesthesia Plan  ASA: III  Anesthesia Plan: General   Post-op Pain Management:    Induction: Intravenous  PONV Risk Score and Plan: 2 and Treatment may vary due to age or medical condition, Ondansetron, Dexamethasone and Midazolam  Airway Management Planned: Oral ETT  Additional Equipment:   Intra-op Plan:   Post-operative Plan: Extubation in OR  Informed Consent: I have reviewed the patients History and Physical, chart, labs and discussed the procedure including the risks, benefits and alternatives for the proposed anesthesia with the patient or authorized representative who has indicated his/her understanding and acceptance.       Plan Discussed with:   Anesthesia Plan Comments:       Anesthesia Quick Evaluation

## 2018-09-27 ENCOUNTER — Encounter: Payer: Self-pay | Admitting: Urology

## 2018-09-28 ENCOUNTER — Ambulatory Visit: Payer: Medicare Other

## 2018-09-28 ENCOUNTER — Telehealth: Payer: Self-pay | Admitting: Urology

## 2018-09-28 LAB — SURGICAL PATHOLOGY

## 2018-09-28 NOTE — Telephone Encounter (Signed)
Pt called and states that he called yesterday 3 times and never got a call back. He took out his Stent himself. He would like to speak with someone about the difficulty he has been having. Please advise.

## 2018-09-28 NOTE — Telephone Encounter (Signed)
Spoke to patient and he states he took the catheter out himself. He cancelled the appointment for this morning for catheter removal. He states he is urinating. He states he has catheters and has had to cath himself in the past. I informed him to call if he has any difficulty urinating and to drink plenty of fluids. Patient voiced understanding, he will keep the October appointment.

## 2018-09-28 NOTE — Anesthesia Postprocedure Evaluation (Signed)
Anesthesia Post Note  Patient: Richard Duncan  Procedure(s) Performed: HOLEP-LASER ENUCLEATION OF THE PROSTATE WITH MORCELLATION (N/A Prostate)  Patient location during evaluation: PACU Anesthesia Type: General Level of consciousness: awake and alert Pain management: pain level controlled Vital Signs Assessment: post-procedure vital signs reviewed and stable Respiratory status: spontaneous breathing, nonlabored ventilation, respiratory function stable and patient connected to nasal cannula oxygen Cardiovascular status: blood pressure returned to baseline and stable Postop Assessment: no apparent nausea or vomiting Anesthetic complications: no     Last Vitals:  Vitals:   09/25/18 1306 09/25/18 1326  BP: 117/60 133/66  Pulse:  68  Resp:  16  Temp: 36.5 C   SpO2: 94% 97%    Last Pain:  Vitals:   09/25/18 1326  TempSrc:   PainSc: 0-No pain                 Martha Clan

## 2018-09-29 ENCOUNTER — Telehealth: Payer: Self-pay | Admitting: Family Medicine

## 2018-09-29 ENCOUNTER — Other Ambulatory Visit: Payer: Self-pay | Admitting: Family Medicine

## 2018-09-29 DIAGNOSIS — N401 Enlarged prostate with lower urinary tract symptoms: Secondary | ICD-10-CM

## 2018-09-29 DIAGNOSIS — C61 Malignant neoplasm of prostate: Secondary | ICD-10-CM

## 2018-09-29 DIAGNOSIS — N138 Other obstructive and reflux uropathy: Secondary | ICD-10-CM

## 2018-09-29 NOTE — Telephone Encounter (Signed)
Patient notified and voiced understanding. Lab order is in.

## 2018-09-29 NOTE — Telephone Encounter (Signed)
-----   Message from Billey Co, MD sent at 09/29/2018  9:14 AM EDT ----- Patient with low risk prostate cancer for ~ 10 years- please let him know there were no cancer cells from his HOLEP prostate tissue, this is good confirmation that he has a very indolent prostate cancer that we can continue to monitor with PSA.  Keep follow up in October, please have him get a PSA prior, thanks  Nickolas Madrid, MD 09/29/2018

## 2018-11-10 ENCOUNTER — Other Ambulatory Visit
Admission: RE | Admit: 2018-11-10 | Discharge: 2018-11-10 | Disposition: A | Discharge status: Home / Self Care | Payer: Medicare Other | Attending: Urology | Admitting: Urology

## 2018-11-10 ENCOUNTER — Ambulatory Visit (INDEPENDENT_AMBULATORY_CARE_PROVIDER_SITE_OTHER): Payer: Medicare Other | Admitting: Urology

## 2018-11-10 ENCOUNTER — Telehealth: Payer: Self-pay

## 2018-11-10 ENCOUNTER — Encounter: Payer: Self-pay | Admitting: Urology

## 2018-11-10 ENCOUNTER — Other Ambulatory Visit: Payer: Self-pay

## 2018-11-10 VITALS — BP 151/81 | HR 88 | Ht 73.0 in | Wt 361.0 lb

## 2018-11-10 DIAGNOSIS — N401 Enlarged prostate with lower urinary tract symptoms: Secondary | ICD-10-CM | POA: Insufficient documentation

## 2018-11-10 DIAGNOSIS — N138 Other obstructive and reflux uropathy: Secondary | ICD-10-CM

## 2018-11-10 DIAGNOSIS — Z8546 Personal history of malignant neoplasm of prostate: Secondary | ICD-10-CM | POA: Diagnosis present

## 2018-11-10 LAB — PSA: Prostatic Specific Antigen: 2.11 ng/mL (ref 0.00–4.00)

## 2018-11-10 LAB — BLADDER SCAN AMB NON-IMAGING

## 2018-11-10 NOTE — Telephone Encounter (Signed)
-----   Message from Billey Co, MD sent at 11/10/2018  1:52 PM EDT ----- PSA down to 2.1 as expected after HoLEP, keep 6 mo follow up, then yearly PSA checks  Nickolas Madrid, MD 11/10/2018

## 2018-11-10 NOTE — Telephone Encounter (Signed)
See my chart message

## 2018-11-10 NOTE — Progress Notes (Signed)
   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  Briefly, he is a 68 year old comorbid male with a pacemaker, morbid obesity, and history of very low risk prostate cancer diagnosed in 2010 with a PSA of 4, with a follow-up biopsy in 2014 that confirmed very low risk disease.  His PSA has been stable since that time, most recently 2.96 in July 2020.  He had urodynamic confirmed severe bladder outlet obstruction and elevated PVRs, and he ultimately elected to undergo HOLEP for management of his urinary symptoms.  He underwent an uncomplicated HOLEP on 123456 with removal of a large median lobe, pathology showed 31 g of benign tissue.  He is doing very well since surgery and reports he is voiding with a very strong stream.  He is still having some mild urinary frequency, but denies any severe urgency or urge incontinence.  He does drink a high volume of fluids during the day as he is a recurrent stone former.  He denies any gross hematuria.  His IPSS score today is 10, with quality of life pleased.  PVR is 54 mL, improved from >358mL pre-op.   ROS: Please see flowsheet from today's date for complete review of systems.  Physical Exam: BP (!) 151/81 (BP Location: Left Arm, Patient Position: Sitting, Cuff Size: Large)   Pulse 88   Ht 6\' 1"  (1.854 m)   Wt (!) 361 lb (163.7 kg)   BMI 47.63 kg/m     Assessment & Plan:   In summary, he is a comorbid 68 year old male with very low risk prostate cancer stable for 10 years since diagnosis in 2010 with PSA of 4, status post HOLEP on 09/25/2018 for severe obstructive symptoms that were confirmed on urodynamics.  He is doing very well postoperatively, and is voiding with a very strong stream.  He has some urinary frequency that is improving since surgery.  PSA today to reestablish new baseline RTC 6  months for symptom check Continue yearly PSA surveillance for his very low risk prostate cancer  A total of 15 minutes were spent face-to-face with the patient, greater than 50% was spent in patient education, counseling, and coordination of care regarding very low risk prostate cancer and HOLEP expectations.  Billey Co, [facility] [address], [place], [place] [address] [phone]

## 2018-12-17 ENCOUNTER — Other Ambulatory Visit: Payer: Self-pay

## 2018-12-17 ENCOUNTER — Encounter: Payer: Self-pay | Admitting: *Deleted

## 2018-12-22 NOTE — Discharge Instructions (Signed)

## 2018-12-25 ENCOUNTER — Other Ambulatory Visit
Admission: RE | Admit: 2018-12-25 | Discharge: 2018-12-25 | Disposition: A | Discharge status: Home / Self Care | Payer: Medicare Other | Source: Ambulatory Visit | Attending: Ophthalmology | Admitting: Ophthalmology

## 2018-12-25 DIAGNOSIS — Z01812 Encounter for preprocedural laboratory examination: Secondary | ICD-10-CM | POA: Insufficient documentation

## 2018-12-25 DIAGNOSIS — Z20828 Contact with and (suspected) exposure to other viral communicable diseases: Secondary | ICD-10-CM | POA: Diagnosis not present

## 2018-12-25 LAB — SARS CORONAVIRUS 2 (TAT 6-24 HRS): SARS Coronavirus 2: NEGATIVE

## 2018-12-28 ENCOUNTER — Ambulatory Visit: Payer: Medicare Other | Admitting: Anesthesiology

## 2018-12-28 ENCOUNTER — Ambulatory Visit
Admission: RE | Admit: 2018-12-28 | Discharge: 2018-12-28 | Disposition: A | Discharge status: Home / Self Care | Payer: Medicare Other | Attending: Ophthalmology | Admitting: Ophthalmology

## 2018-12-28 ENCOUNTER — Other Ambulatory Visit: Payer: Self-pay

## 2018-12-28 ENCOUNTER — Encounter
Admission: RE | Disposition: A | Discharge status: Home / Self Care | Payer: Self-pay | Source: Home / Self Care | Attending: Ophthalmology

## 2018-12-28 DIAGNOSIS — Z6841 Body Mass Index (BMI) 40.0 and over, adult: Secondary | ICD-10-CM | POA: Diagnosis not present

## 2018-12-28 DIAGNOSIS — N400 Enlarged prostate without lower urinary tract symptoms: Secondary | ICD-10-CM | POA: Insufficient documentation

## 2018-12-28 DIAGNOSIS — I4891 Unspecified atrial fibrillation: Secondary | ICD-10-CM | POA: Diagnosis not present

## 2018-12-28 DIAGNOSIS — H2511 Age-related nuclear cataract, right eye: Secondary | ICD-10-CM | POA: Diagnosis present

## 2018-12-28 DIAGNOSIS — G4730 Sleep apnea, unspecified: Secondary | ICD-10-CM | POA: Insufficient documentation

## 2018-12-28 DIAGNOSIS — Z79899 Other long term (current) drug therapy: Secondary | ICD-10-CM | POA: Insufficient documentation

## 2018-12-28 DIAGNOSIS — M1990 Unspecified osteoarthritis, unspecified site: Secondary | ICD-10-CM | POA: Diagnosis not present

## 2018-12-28 DIAGNOSIS — Z950 Presence of cardiac pacemaker: Secondary | ICD-10-CM | POA: Diagnosis not present

## 2018-12-28 DIAGNOSIS — I10 Essential (primary) hypertension: Secondary | ICD-10-CM | POA: Diagnosis not present

## 2018-12-28 DIAGNOSIS — J449 Chronic obstructive pulmonary disease, unspecified: Secondary | ICD-10-CM | POA: Diagnosis not present

## 2018-12-28 HISTORY — DX: Presence of other specified functional implants: Z96.89

## 2018-12-28 HISTORY — DX: Torticollis: M43.6

## 2018-12-28 HISTORY — PX: CATARACT EXTRACTION W/PHACO: SHX586

## 2018-12-28 SURGERY — PHACOEMULSIFICATION, CATARACT, WITH IOL INSERTION
Anesthesia: Monitor Anesthesia Care | Site: Eye | Laterality: Right

## 2018-12-28 MED ORDER — ARMC OPHTHALMIC DILATING DROPS
1.0000 "application " | OPHTHALMIC | Status: DC | PRN
  Administered 2018-12-28 (×3): 1 via OPHTHALMIC

## 2018-12-28 MED ORDER — GLYCOPYRROLATE 0.2 MG/ML IJ SOLN
INTRAMUSCULAR | Status: DC | PRN
  Administered 2018-12-28: 0.2 mg via INTRAVENOUS

## 2018-12-28 MED ORDER — LIDOCAINE HCL (PF) 2 % IJ SOLN
INTRAOCULAR | Status: DC | PRN
  Administered 2018-12-28: 1 mL via INTRAOCULAR

## 2018-12-28 MED ORDER — EPINEPHRINE PF 1 MG/ML IJ SOLN
INTRAOCULAR | Status: DC | PRN
  Administered 2018-12-28: 72 mL via OPHTHALMIC

## 2018-12-28 MED ORDER — SODIUM HYALURONATE 10 MG/ML IO SOLN
INTRAOCULAR | Status: DC | PRN
  Administered 2018-12-28: 0.55 mL via INTRAOCULAR

## 2018-12-28 MED ORDER — TETRACAINE HCL 0.5 % OP SOLN
1.0000 [drp] | OPHTHALMIC | Status: DC | PRN
  Administered 2018-12-28 (×3): 1 [drp] via OPHTHALMIC

## 2018-12-28 MED ORDER — MOXIFLOXACIN HCL 0.5 % OP SOLN
OPHTHALMIC | Status: DC | PRN
  Administered 2018-12-28: 0.2 mL via OPHTHALMIC

## 2018-12-28 MED ORDER — ACETAMINOPHEN 325 MG PO TABS: 325.0000 mg | ORAL_TABLET | Freq: Once | ORAL | Status: DC

## 2018-12-28 MED ORDER — LIDOCAINE HCL (PF) 2 % IJ SOLN
INTRAMUSCULAR | Status: DC | PRN
  Administered 2018-12-28: 40 mg via INTRADERMAL

## 2018-12-28 MED ORDER — LACTATED RINGERS IV SOLN: 10.0000 mL/h | INTRAVENOUS | Status: DC

## 2018-12-28 MED ORDER — FENTANYL CITRATE (PF) 100 MCG/2ML IJ SOLN
INTRAMUSCULAR | Status: DC | PRN
  Administered 2018-12-28: 100 ug via INTRAVENOUS

## 2018-12-28 MED ORDER — ACETAMINOPHEN 160 MG/5ML PO SOLN: 325.0000 mg | Freq: Once | ORAL | Status: DC

## 2018-12-28 MED ORDER — SODIUM HYALURONATE 23 MG/ML IO SOLN
INTRAOCULAR | Status: DC | PRN
  Administered 2018-12-28: 0.6 mL via INTRAOCULAR

## 2018-12-28 MED ORDER — MIDAZOLAM HCL 2 MG/2ML IJ SOLN
INTRAMUSCULAR | Status: DC | PRN
  Administered 2018-12-28: 2 mg via INTRAVENOUS

## 2018-12-28 SURGICAL SUPPLY — 19 items
CANNULA ANT/CHMB 27G (MISCELLANEOUS) ×2 IMPLANT
CANNULA ANT/CHMB 27GA (MISCELLANEOUS) ×6 IMPLANT
DISSECTOR HYDRO NUCLEUS 50X22 (MISCELLANEOUS) ×3 IMPLANT
GLOVE SURG LX 7.5 STRW (GLOVE) ×2
GLOVE SURG LX STRL 7.5 STRW (GLOVE) ×1 IMPLANT
GLOVE SURG SYN 8.5  E (GLOVE) ×2
GLOVE SURG SYN 8.5 E (GLOVE) ×1 IMPLANT
GLOVE SURG SYN 8.5 PF PI (GLOVE) ×1 IMPLANT
GOWN STRL REUS W/ TWL LRG LVL3 (GOWN DISPOSABLE) ×2 IMPLANT
GOWN STRL REUS W/TWL LRG LVL3 (GOWN DISPOSABLE) ×4
LENS IOL TECNIS ITEC 20.0 (Intraocular Lens) ×2 IMPLANT
MARKER SKIN DUAL TIP RULER LAB (MISCELLANEOUS) ×3 IMPLANT
PACK DR. KING ARMS (PACKS) ×3 IMPLANT
PACK EYE AFTER SURG (MISCELLANEOUS) ×3 IMPLANT
PACK OPTHALMIC (MISCELLANEOUS) ×3 IMPLANT
SYR 3ML LL SCALE MARK (SYRINGE) ×3 IMPLANT
SYR TB 1ML LUER SLIP (SYRINGE) ×3 IMPLANT
WATER STERILE IRR 250ML POUR (IV SOLUTION) ×3 IMPLANT
WIPE NON LINTING 3.25X3.25 (MISCELLANEOUS) ×3 IMPLANT

## 2018-12-28 NOTE — Anesthesia Preprocedure Evaluation (Signed)
Anesthesia Evaluation  Patient identified by MRN, date of birth, ID band Patient awake    Reviewed: Allergy & Precautions, H&P , NPO status , Patient's Chart, lab work & pertinent test results  Airway Mallampati: II  TM Distance: >3 FB Neck ROM: full    Dental no notable dental hx.    Pulmonary sleep apnea and Continuous Positive Airway Pressure Ventilation , COPD,  COPD inhaler, former smoker,    Pulmonary exam normal breath sounds clear to auscultation       Cardiovascular hypertension, Normal cardiovascular exam+ pacemaker  Rhythm:regular Rate:Normal     Neuro/Psych  Neuromuscular disease    GI/Hepatic GERD  ,  Endo/Other  Morbid obesity  Renal/GU      Musculoskeletal   Abdominal   Peds  Hematology   Anesthesia Other Findings   Reproductive/Obstetrics                             Anesthesia Physical Anesthesia Plan  ASA: III  Anesthesia Plan: MAC   Post-op Pain Management:    Induction:   PONV Risk Score and Plan: 1 and TIVA and Treatment may vary due to age or medical condition  Airway Management Planned:   Additional Equipment:   Intra-op Plan:   Post-operative Plan:   Informed Consent: I have reviewed the patients History and Physical, chart, labs and discussed the procedure including the risks, benefits and alternatives for the proposed anesthesia with the patient or authorized representative who has indicated his/her understanding and acceptance.       Plan Discussed with: CRNA  Anesthesia Plan Comments:         Anesthesia Quick Evaluation

## 2018-12-28 NOTE — OR Nursing (Signed)
Spoke with Dr. Edison Pace regarding pt's allergy to steroid eye drops, Dr. Edison Pace said to go ahead with the eye drops at home per order and he will evaluate pt at am appointment tomorrow.Marland Kitchen

## 2018-12-28 NOTE — Anesthesia Procedure Notes (Signed)
Procedure Name: MAC Performed by: Asad Keeven M, CRNA Pre-anesthesia Checklist: Timeout performed, Patient being monitored, Suction available, Emergency Drugs available and Patient identified Patient Re-evaluated:Patient Re-evaluated prior to induction Oxygen Delivery Method: Nasal cannula       

## 2018-12-28 NOTE — Anesthesia Procedure Notes (Signed)
Procedure Name: MAC Performed by: Izetta Dakin, CRNA Pre-anesthesia Checklist: Timeout performed, Patient being monitored, Suction available, Emergency Drugs available and Patient identified Patient Re-evaluated:Patient Re-evaluated prior to induction Oxygen Delivery Method: Nasal cannula

## 2018-12-28 NOTE — Op Note (Signed)
OPERATIVE NOTE  SIDY CURTNER [id number] 12/28/2018   PREOPERATIVE DIAGNOSIS:  Nuclear sclerotic cataract right eye.  H25.11   POSTOPERATIVE DIAGNOSIS:    Nuclear sclerotic cataract right eye.     PROCEDURE:  Phacoemusification with posterior chamber intraocular lens placement of the right eye   LENS:   Implant Name Type Inv. Item Serial No. Manufacturer Lot No. LRB No. Used Action  LENS IOL DIOP 20.0 - [id number] Intraocular Lens LENS IOL DIOP 20.0 [id number]  Right 1 Implanted       Procedure(s): CATARACT EXTRACTION PHACO AND INTRAOCULAR LENS PLACEMENT (IOC) RIGHT 2.82  00:26.4 (Right)  PCB00 +20.0   ULTRASOUND TIME: 0 minutes 26 seconds.  CDE 2.82   SURGEON:  Benay Pillow, MD, MPH  ANESTHESIOLOGIST: Anesthesiologist: Ronelle Nigh, MD CRNA: Izetta Dakin, CRNA   ANESTHESIA:  Topical with tetracaine drops augmented with 1% preservative-free intracameral lidocaine.  ESTIMATED BLOOD LOSS: less than 1 mL.   COMPLICATIONS:  None.   DESCRIPTION OF PROCEDURE:  The patient was identified in the holding room and transported to the operating room and placed in the supine position under the operating microscope.  The right eye was identified as the operative eye and it was prepped and draped in the usual sterile ophthalmic fashion.   A 1.0 millimeter clear-corneal paracentesis was made at the 10:30 position. 0.5 ml of preservative-free 1% lidocaine with epinephrine was injected into the anterior chamber.  The anterior chamber was filled with Healon 5 viscoelastic.  A 2.4 millimeter keratome was used to make a near-clear corneal incision at the 8:00 position.  A curvilinear capsulorrhexis was made with a cystotome and capsulorrhexis forceps.  Balanced salt solution was used to hydrodissect and hydrodelineate the nucleus.   Phacoemulsification was then used in stop and chop fashion to remove the lens nucleus and epinucleus.  The remaining cortex was then removed using the  irrigation and aspiration handpiece. Healon was then placed into the capsular bag to distend it for lens placement.  A lens was then injected into the capsular bag.  The remaining viscoelastic was aspirated.   Wounds were hydrated with balanced salt solution.  The anterior chamber was inflated to a physiologic pressure with balanced salt solution.   Intracameral vigamox 0.1 mL undiluted was injected into the eye and a drop placed onto the ocular surface.  No wound leaks were noted.  The patient was taken to the recovery room in stable condition without complications of anesthesia or surgery  Benay Pillow 12/28/2018, 12:20 PM

## 2018-12-28 NOTE — Anesthesia Postprocedure Evaluation (Signed)
Anesthesia Post Note  Patient: Richard Duncan  Procedure(s) Performed: CATARACT EXTRACTION PHACO AND INTRAOCULAR LENS PLACEMENT (IOC) RIGHT 2.82  00:26.4 (Right Eye)     Patient location during evaluation: PACU Anesthesia Type: MAC Level of consciousness: awake and alert and oriented Pain management: satisfactory to patient Vital Signs Assessment: post-procedure vital signs reviewed and stable Respiratory status: spontaneous breathing, nonlabored ventilation and respiratory function stable Cardiovascular status: blood pressure returned to baseline and stable Postop Assessment: Adequate PO intake and No signs of nausea or vomiting Anesthetic complications: no    Raliegh Ip

## 2018-12-28 NOTE — H&P (Signed)

## 2018-12-28 NOTE — Transfer of Care (Signed)
Immediate Anesthesia Transfer of Care Note  Patient: Richard Duncan  Procedure(s) Performed: CATARACT EXTRACTION PHACO AND INTRAOCULAR LENS PLACEMENT (IOC) RIGHT 2.82  00:26.4 (Right Eye)  Patient Location: PACU  Anesthesia Type: MAC  Level of Consciousness: awake, alert  and patient cooperative  Airway and Oxygen Therapy: Patient Spontanous Breathing and Patient connected to supplemental oxygen  Post-op Assessment: Post-op Vital signs reviewed, Patient's Cardiovascular Status Stable, Respiratory Function Stable, Patent Airway and No signs of Nausea or vomiting  Post-op Vital Signs: Reviewed and stable  Complications: No apparent anesthesia complications

## 2019-01-14 ENCOUNTER — Other Ambulatory Visit: Payer: Self-pay

## 2019-01-14 ENCOUNTER — Encounter: Payer: Self-pay | Admitting: Ophthalmology

## 2019-01-19 NOTE — Discharge Instructions (Signed)

## 2019-01-21 ENCOUNTER — Other Ambulatory Visit
Admission: RE | Admit: 2019-01-21 | Discharge: 2019-01-21 | Disposition: A | Discharge status: Home / Self Care | Payer: Medicare Other | Source: Ambulatory Visit | Attending: Ophthalmology | Admitting: Ophthalmology

## 2019-01-21 DIAGNOSIS — Z01812 Encounter for preprocedural laboratory examination: Secondary | ICD-10-CM | POA: Diagnosis present

## 2019-01-21 DIAGNOSIS — Z20828 Contact with and (suspected) exposure to other viral communicable diseases: Secondary | ICD-10-CM | POA: Insufficient documentation

## 2019-01-21 LAB — SARS CORONAVIRUS 2 (TAT 6-24 HRS): SARS Coronavirus 2: NEGATIVE

## 2019-01-25 ENCOUNTER — Other Ambulatory Visit: Payer: Self-pay

## 2019-01-25 ENCOUNTER — Ambulatory Visit: Payer: Medicare Other | Admitting: Anesthesiology

## 2019-01-25 ENCOUNTER — Encounter
Admission: RE | Disposition: A | Discharge status: Home / Self Care | Payer: Self-pay | Source: Home / Self Care | Attending: Ophthalmology

## 2019-01-25 ENCOUNTER — Ambulatory Visit
Admission: RE | Admit: 2019-01-25 | Discharge: 2019-01-25 | Disposition: A | Discharge status: Home / Self Care | Payer: Medicare Other | Attending: Ophthalmology | Admitting: Ophthalmology

## 2019-01-25 ENCOUNTER — Encounter: Payer: Self-pay | Admitting: Ophthalmology

## 2019-01-25 DIAGNOSIS — I10 Essential (primary) hypertension: Secondary | ICD-10-CM | POA: Insufficient documentation

## 2019-01-25 DIAGNOSIS — Z87891 Personal history of nicotine dependence: Secondary | ICD-10-CM | POA: Diagnosis not present

## 2019-01-25 DIAGNOSIS — Z888 Allergy status to other drugs, medicaments and biological substances status: Secondary | ICD-10-CM | POA: Insufficient documentation

## 2019-01-25 DIAGNOSIS — F329 Major depressive disorder, single episode, unspecified: Secondary | ICD-10-CM | POA: Insufficient documentation

## 2019-01-25 DIAGNOSIS — H2512 Age-related nuclear cataract, left eye: Secondary | ICD-10-CM | POA: Diagnosis present

## 2019-01-25 DIAGNOSIS — J449 Chronic obstructive pulmonary disease, unspecified: Secondary | ICD-10-CM | POA: Insufficient documentation

## 2019-01-25 DIAGNOSIS — K219 Gastro-esophageal reflux disease without esophagitis: Secondary | ICD-10-CM | POA: Insufficient documentation

## 2019-01-25 DIAGNOSIS — Z79899 Other long term (current) drug therapy: Secondary | ICD-10-CM | POA: Diagnosis not present

## 2019-01-25 DIAGNOSIS — Z950 Presence of cardiac pacemaker: Secondary | ICD-10-CM | POA: Diagnosis not present

## 2019-01-25 DIAGNOSIS — F419 Anxiety disorder, unspecified: Secondary | ICD-10-CM | POA: Diagnosis not present

## 2019-01-25 DIAGNOSIS — G4730 Sleep apnea, unspecified: Secondary | ICD-10-CM | POA: Insufficient documentation

## 2019-01-25 HISTORY — PX: CATARACT EXTRACTION W/PHACO: SHX586

## 2019-01-25 SURGERY — PHACOEMULSIFICATION, CATARACT, WITH IOL INSERTION
Anesthesia: Monitor Anesthesia Care | Site: Eye | Laterality: Left

## 2019-01-25 MED ORDER — SODIUM HYALURONATE 10 MG/ML IO SOLN
INTRAOCULAR | Status: DC | PRN
  Administered 2019-01-25: 0.55 mL via INTRAOCULAR

## 2019-01-25 MED ORDER — EPINEPHRINE PF 1 MG/ML IJ SOLN
INTRAOCULAR | Status: DC | PRN
  Administered 2019-01-25: 11:00:00 71 mL via OPHTHALMIC

## 2019-01-25 MED ORDER — MIDAZOLAM HCL 2 MG/2ML IJ SOLN
INTRAMUSCULAR | Status: DC | PRN
  Administered 2019-01-25: 2 mg via INTRAVENOUS

## 2019-01-25 MED ORDER — MOXIFLOXACIN HCL 0.5 % OP SOLN
OPHTHALMIC | Status: DC | PRN
  Administered 2019-01-25: 0.2 mL via OPHTHALMIC

## 2019-01-25 MED ORDER — FENTANYL CITRATE (PF) 100 MCG/2ML IJ SOLN
INTRAMUSCULAR | Status: DC | PRN
  Administered 2019-01-25: 100 ug via INTRAVENOUS

## 2019-01-25 MED ORDER — ACETAMINOPHEN 325 MG PO TABS: 325.0000 mg | ORAL_TABLET | ORAL | Status: DC | PRN

## 2019-01-25 MED ORDER — TETRACAINE HCL 0.5 % OP SOLN
1.0000 [drp] | OPHTHALMIC | Status: DC | PRN
  Administered 2019-01-25 (×3): 1 [drp] via OPHTHALMIC

## 2019-01-25 MED ORDER — ARMC OPHTHALMIC DILATING DROPS
1.0000 "application " | OPHTHALMIC | Status: DC | PRN
  Administered 2019-01-25 (×3): 1 via OPHTHALMIC

## 2019-01-25 MED ORDER — SODIUM HYALURONATE 23 MG/ML IO SOLN
INTRAOCULAR | Status: DC | PRN
  Administered 2019-01-25: 0.6 mL via INTRAOCULAR

## 2019-01-25 MED ORDER — ACETAMINOPHEN 160 MG/5ML PO SOLN: 325.0000 mg | ORAL | Status: DC | PRN

## 2019-01-25 MED ORDER — LIDOCAINE HCL (PF) 2 % IJ SOLN
INTRAOCULAR | Status: DC | PRN
  Administered 2019-01-25: 11:00:00 1 mL via INTRAOCULAR

## 2019-01-25 SURGICAL SUPPLY — 19 items
CANNULA ANT/CHMB 27G (MISCELLANEOUS) ×2 IMPLANT
CANNULA ANT/CHMB 27GA (MISCELLANEOUS) ×6 IMPLANT
DISSECTOR HYDRO NUCLEUS 50X22 (MISCELLANEOUS) ×3 IMPLANT
GLOVE SURG LX 7.5 STRW (GLOVE) ×4
GLOVE SURG LX STRL 7.5 STRW (GLOVE) ×1 IMPLANT
GLOVE SURG SYN 8.5  E (GLOVE) ×2
GLOVE SURG SYN 8.5 E (GLOVE) ×1 IMPLANT
GLOVE SURG SYN 8.5 PF PI (GLOVE) ×1 IMPLANT
GOWN STRL REUS W/ TWL LRG LVL3 (GOWN DISPOSABLE) ×2 IMPLANT
GOWN STRL REUS W/TWL LRG LVL3 (GOWN DISPOSABLE) ×4
LENS IOL TECNIS ITEC 21.0 (Intraocular Lens) ×2 IMPLANT
MARKER SKIN DUAL TIP RULER LAB (MISCELLANEOUS) ×3 IMPLANT
PACK DR. KING ARMS (PACKS) ×3 IMPLANT
PACK EYE AFTER SURG (MISCELLANEOUS) ×3 IMPLANT
PACK OPTHALMIC (MISCELLANEOUS) ×3 IMPLANT
SYR 3ML LL SCALE MARK (SYRINGE) ×3 IMPLANT
SYR TB 1ML LUER SLIP (SYRINGE) ×3 IMPLANT
WATER STERILE IRR 250ML POUR (IV SOLUTION) ×3 IMPLANT
WIPE NON LINTING 3.25X3.25 (MISCELLANEOUS) ×3 IMPLANT

## 2019-01-25 NOTE — H&P (Signed)

## 2019-01-25 NOTE — Anesthesia Postprocedure Evaluation (Signed)
Anesthesia Post Note  Patient: Richard Duncan  Procedure(s) Performed: CATARACT EXTRACTION PHACO AND INTRAOCULAR LENS PLACEMENT ([place]) LEFT (Left Eye)     Patient location during evaluation: PACU Anesthesia Type: MAC Level of consciousness: awake and alert Pain management: pain level controlled Vital Signs Assessment: post-procedure vital signs reviewed and stable Respiratory status: spontaneous breathing, nonlabored ventilation, respiratory function stable and patient connected to nasal cannula oxygen Cardiovascular status: stable and blood pressure returned to baseline Postop Assessment: no apparent nausea or vomiting Anesthetic complications: no    Trecia Rogers

## 2019-01-25 NOTE — Anesthesia Preprocedure Evaluation (Signed)
Anesthesia Evaluation  Patient identified by MRN, date of birth, ID band Patient awake    Reviewed: Allergy & Precautions, H&P , NPO status , Patient's Chart, lab work & pertinent test results, reviewed documented beta blocker date and time   History of Anesthesia Complications (+) DIFFICULT AIRWAY and history of anesthetic complications  Airway Mallampati: III  TM Distance: >3 FB Neck ROM: full    Dental no notable dental hx.    Pulmonary shortness of breath, sleep apnea and Continuous Positive Airway Pressure Ventilation , COPD, former smoker,    Pulmonary exam normal breath sounds clear to auscultation       Cardiovascular Exercise Tolerance: Good hypertension, Normal cardiovascular exam+ pacemaker  Rhythm:regular Rate:Normal     Neuro/Psych PSYCHIATRIC DISORDERS Anxiety Depression negative neurological ROS     GI/Hepatic Neg liver ROS, GERD  Controlled,  Endo/Other  negative endocrine ROS  Renal/GU negative Renal ROS     Musculoskeletal   Abdominal   Peds  Hematology negative hematology ROS (+)   Anesthesia Other Findings   Reproductive/Obstetrics negative OB ROS                             Anesthesia Physical Anesthesia Plan  ASA: III  Anesthesia Plan: MAC   Post-op Pain Management:    Induction:   PONV Risk Score and Plan:   Airway Management Planned:   Additional Equipment:   Intra-op Plan:   Post-operative Plan:   Informed Consent: I have reviewed the patients History and Physical, chart, labs and discussed the procedure including the risks, benefits and alternatives for the proposed anesthesia with the patient or authorized representative who has indicated his/her understanding and acceptance.     Dental Advisory Given  Plan Discussed with: CRNA  Anesthesia Plan Comments:         Anesthesia Quick Evaluation

## 2019-01-25 NOTE — H&P (Signed)

## 2019-01-25 NOTE — Anesthesia Procedure Notes (Signed)
Procedure Name: MAC Performed by: Jeanmarc Viernes, CRNA Pre-anesthesia Checklist: Patient identified, Emergency Drugs available, Suction available, Timeout performed and Patient being monitored Patient Re-evaluated:Patient Re-evaluated prior to induction Oxygen Delivery Method: Nasal cannula Placement Confirmation: positive ETCO2       

## 2019-01-25 NOTE — Transfer of Care (Signed)
Immediate Anesthesia Transfer of Care Note  Patient: Richard Duncan  Procedure(s) Performed: CATARACT EXTRACTION PHACO AND INTRAOCULAR LENS PLACEMENT (IOC) LEFT (Left Eye)  Patient Location: PACU  Anesthesia Type: MAC  Level of Consciousness: awake, alert  and patient cooperative  Airway and Oxygen Therapy: Patient Spontanous Breathing and Patient connected to supplemental oxygen  Post-op Assessment: Post-op Vital signs reviewed, Patient's Cardiovascular Status Stable, Respiratory Function Stable, Patent Airway and No signs of Nausea or vomiting  Post-op Vital Signs: Reviewed and stable  Complications: No apparent anesthesia complications

## 2019-01-25 NOTE — Op Note (Signed)
OPERATIVE NOTE  GASPAR CUSANO [id number] 01/25/2019   PREOPERATIVE DIAGNOSIS:  Nuclear sclerotic cataract left eye.  H25.12   POSTOPERATIVE DIAGNOSIS:    Nuclear sclerotic cataract left eye.     PROCEDURE:  Phacoemusification with posterior chamber intraocular lens placement of the left eye   LENS:   Implant Name Type Inv. Item Serial No. Manufacturer Lot No. LRB No. Used Action  LENS IOL DIOP 21.0 - [id number] Intraocular Lens LENS IOL DIOP 21.0 [id number]  Left 1 Implanted      Procedure(s) with comments: CATARACT EXTRACTION PHACO AND INTRAOCULAR LENS PLACEMENT (IOC) LEFT (Left) - CDE 11.49 [place] 0:24.4  PCB00 +21.0   ULTRASOUND TIME: 0 minutes 24 seconds.  CDE 1.49   SURGEON:  Benay Pillow, MD, MPH   ANESTHESIA:  Topical with tetracaine drops augmented with 1% preservative-free intracameral lidocaine.  ESTIMATED BLOOD LOSS: <1 mL   COMPLICATIONS:  None.   DESCRIPTION OF PROCEDURE:  The patient was identified in the holding room and transported to the operating room and placed in the supine position under the operating microscope.  The left eye was identified as the operative eye and it was prepped and draped in the usual sterile ophthalmic fashion.   A 1.0 millimeter clear-corneal paracentesis was made at the 5:00 position. 0.5 ml of preservative-free 1% lidocaine with epinephrine was injected into the anterior chamber.  The anterior chamber was filled with Healon 5 viscoelastic.  A 2.4 millimeter keratome was used to make a near-clear corneal incision at the 2:00 position.  A curvilinear capsulorrhexis was made with a cystotome and capsulorrhexis forceps.  Balanced salt solution was used to hydrodissect and hydrodelineate the nucleus.   Phacoemulsification was then used in stop and chop fashion to remove the lens nucleus and epinucleus.  The remaining cortex was then removed using the irrigation and aspiration handpiece. Healon was then placed into the capsular bag to  distend it for lens placement.  A lens was then injected into the capsular bag.  The remaining viscoelastic was aspirated.   Wounds were hydrated with balanced salt solution.  The anterior chamber was inflated to a physiologic pressure with balanced salt solution.  Intracameral vigamox 0.1 mL undiltued was injected into the eye and a drop placed onto the ocular surface.  No wound leaks were noted.  The patient was taken to the recovery room in stable condition without complications of anesthesia or surgery  Benay Pillow 01/25/2019, 11:05 AM

## 2019-01-26 ENCOUNTER — Encounter: Payer: Self-pay | Admitting: *Deleted

## 2019-03-11 ENCOUNTER — Ambulatory Visit: Payer: Medicare HMO

## 2019-03-12 ENCOUNTER — Ambulatory Visit: Payer: Medicare HMO | Attending: Internal Medicine

## 2019-03-12 ENCOUNTER — Other Ambulatory Visit: Payer: Self-pay

## 2019-03-12 DIAGNOSIS — Z23 Encounter for immunization: Secondary | ICD-10-CM | POA: Insufficient documentation

## 2019-03-12 NOTE — Progress Notes (Signed)
   Covid-19 Vaccination Clinic  Name:  Richard Duncan    MRN: [id number] DOB: [birth year]  03/12/2019  [name] was observed post Covid-19 immunization for 15 minutes without incidence. He was provided with Vaccine Information Sheet and instruction to access the V-Safe system.   [name] was instructed to call 911 with any severe reactions post vaccine: Marland Kitchen Difficulty breathing  . Swelling of your face and throat  . A fast heartbeat  . A bad rash all over your body  . Dizziness and weakness    Immunizations Administered    Name Date Dose VIS Date Route   Pfizer COVID-19 Vaccine 03/12/2019 11:15 AM 0.3 mL 01/08/2019 Intramuscular   Manufacturer: [place]   Lot: [id number]   [place]: [id number]

## 2019-04-07 ENCOUNTER — Ambulatory Visit: Payer: Medicare HMO | Attending: Internal Medicine

## 2019-04-07 DIAGNOSIS — Z23 Encounter for immunization: Secondary | ICD-10-CM | POA: Insufficient documentation

## 2019-04-07 NOTE — Progress Notes (Signed)
   Covid-19 Vaccination Clinic  Name:  Richard Duncan    MRN: [id number] DOB: 1950-03-30  04/07/2019  Richard Duncan was observed post Covid-19 immunization for 15 minutes without incident. He was provided with Vaccine Information Sheet and instruction to access the V-Safe system.   Richard Duncan was instructed to call 911 with any severe reactions post vaccine: Marland Kitchen Difficulty breathing  . Swelling of face and throat  . A fast heartbeat  . A bad rash all over body  . Dizziness and weakness   Immunizations Administered    Name Date Dose VIS Date Route   Pfizer COVID-19 Vaccine 04/07/2019  9:59 AM 0.3 mL 01/08/2019 Intramuscular   Manufacturer: [place]   Lot: [id number]   [place]: [id number]

## 2019-04-27 ENCOUNTER — Other Ambulatory Visit: Payer: Self-pay | Admitting: Urology

## 2019-05-11 ENCOUNTER — Encounter: Payer: Self-pay | Admitting: Urology

## 2019-05-11 ENCOUNTER — Ambulatory Visit: Payer: Medicare HMO | Admitting: Urology

## 2019-05-11 ENCOUNTER — Other Ambulatory Visit
Admission: RE | Admit: 2019-05-11 | Discharge: 2019-05-11 | Disposition: A | Discharge status: Home / Self Care | Payer: Medicare HMO | Attending: Urology | Admitting: Urology

## 2019-05-11 ENCOUNTER — Other Ambulatory Visit: Payer: Self-pay

## 2019-05-11 ENCOUNTER — Telehealth: Payer: Self-pay

## 2019-05-11 VITALS — BP 144/74 | HR 74 | Ht 73.0 in | Wt 361.0 lb

## 2019-05-11 DIAGNOSIS — R399 Unspecified symptoms and signs involving the genitourinary system: Secondary | ICD-10-CM

## 2019-05-11 DIAGNOSIS — N138 Other obstructive and reflux uropathy: Secondary | ICD-10-CM

## 2019-05-11 DIAGNOSIS — N401 Enlarged prostate with lower urinary tract symptoms: Secondary | ICD-10-CM | POA: Diagnosis not present

## 2019-05-11 DIAGNOSIS — R350 Frequency of micturition: Secondary | ICD-10-CM

## 2019-05-11 LAB — URINALYSIS, COMPLETE (UACMP) WITH MICROSCOPIC
Bacteria, UA: NONE SEEN
Bilirubin Urine: NEGATIVE
Glucose, UA: NEGATIVE mg/dL
Ketones, ur: NEGATIVE mg/dL
Leukocytes,Ua: NEGATIVE
Nitrite: NEGATIVE
Protein, ur: 30 mg/dL — AB
RBC / HPF: >50 RBC/hpf (ref 0–5)
Specific Gravity, Urine: 1.025 (ref 1.005–1.030)
pH: 5 (ref 5.0–8.0)

## 2019-05-11 LAB — BLADDER SCAN AMB NON-IMAGING

## 2019-05-11 NOTE — Telephone Encounter (Signed)
Called pt no answer. Left detailed message informing pt of Information below per DPR. Advised to call back for questions or concerns.

## 2019-05-11 NOTE — Telephone Encounter (Signed)
-----   Message from Billey Co, MD sent at 05/11/2019  9:58 AM EDT ----- No evidence of UTI, avoid bladder irritants, call if leakage worsening or develops flank pain/stone symptoms.  Nickolas Madrid, MD 05/11/2019

## 2019-05-11 NOTE — Progress Notes (Signed)
   05/11/2019 9:00 AM   VANCIL GORZYNSKI 11-21-1950 [id number]  Reason for visit: Follow up low risk prostate cancer, BPH s/p HoLEP, history of nephrolithiasis  HPI: I saw [name] in urology clinic today for follow-up after HOLEP.  Briefly, he is a 69 year old comorbid male with a pacemaker, morbid obesity, and history of very low risk prostate cancer diagnosed in 2010 with a PSA of 4, with a follow-up biopsy in 2014 that confirmed very low risk disease.  His PSA has been stable since that time, most recently 2.96 in July 2020.  He had urodynamics confirmed severe bladder outlet obstruction and elevated PVRs, and he ultimately elected to undergo HOLEP for management of his urinary symptoms.  He underwent an uncomplicated HOLEP on 123456 with removal of a large median lobe, pathology showed 31 g of benign tissue.  He continues to do very well since surgery.  He reports he is voiding with a very strong stream and having nocturia 0-1 time per night.  He has noticed a few episodes of incontinence over the last week, but overall has been doing well with no leakage prior.  He denies any dysuria or fevers. He does drink a high volume of fluids during the day as he is a recurrent stone former.  He denies any gross hematuria.  He denies any stone episodes since our last visit.  PVR is 90 mL, improved from >359mL pre-op.  PSA postop was down to 2.1.   Physical Exam: BP (!) 144/74 (BP Location: Left Arm, Patient Position: Sitting, Cuff Size: Large)   Pulse 74   Ht 6\' 1"  (1.854 m)   Wt (!) 361 lb (163.7 kg)   BMI 47.63 kg/m    Constitutional: Obese, uses walker  Laboratory Data: Urinalysis pending   Assessment & Plan:   In summary, he is a very comorbid 69 year old male with history of low risk prostate cancer who ultimately elected for HOLEP for bladder outlet obstruction confirmed on urodynamics.  He is doing very well post-op and is happy with his urinary symptoms currently.  He has  noted some urgency/frequency and incontinence over the last week concerning for possible UTI.  We also reviewed his history of prostate cancer and the need for ongoing PSA monitoring.  Urinalysis and culture today to evaluate for UTI, call with results Continue sildenafil for ED Follow-up with PSA in 6 months for history of prostate cancer on active surveillance/watchful waiting  Billey Co, MD  [facility] [address], [place], [place] [address] [phone]

## 2019-05-11 NOTE — Patient Instructions (Signed)
Dietary Guidelines to Help Prevent Kidney Stones Kidney stones are deposits of minerals and salts that form inside your kidneys. Your risk of developing kidney stones may be greater depending on your diet, your lifestyle, the medicines you take, and whether you have certain medical conditions. Most people can reduce their chances of developing kidney stones by following the instructions below. Depending on your overall health and the type of kidney stones you tend to develop, your dietitian may give you more specific instructions. What are tips for following this plan? Reading food labels  Choose foods with "no salt added" or "low-salt" labels. Limit your sodium intake to less than 1500 mg per day.  Choose foods with calcium for each meal and snack. Try to eat about 300 mg of calcium at each meal. Foods that contain 200-500 mg of calcium per serving include: ? 8 oz (237 ml) of milk, fortified nondairy milk, and fortified fruit juice. ? 8 oz (237 ml) of kefir, yogurt, and soy yogurt. ? 4 oz (118 ml) of tofu. ? 1 oz of cheese. ? 1 cup (300 g) of dried figs. ? 1 cup (91 g) of cooked broccoli. ? 1-3 oz can of sardines or mackerel.  Most people need 1000 to 1500 mg of calcium each day. Talk to your dietitian about how much calcium is recommended for you. Shopping  Buy plenty of fresh fruits and vegetables. Most people do not need to avoid fruits and vegetables, even if they contain nutrients that may contribute to kidney stones.  When shopping for convenience foods, choose: ? Whole pieces of fruit. ? Premade salads with dressing on the side. ? Low-fat fruit and yogurt smoothies.  Avoid buying frozen meals or prepared deli foods.  Look for foods with live cultures, such as yogurt and kefir. Cooking  Do not add salt to food when cooking. Place a salt shaker on the table and allow each person to add his or her own salt to taste.  Use vegetable protein, such as beans, textured vegetable  protein (TVP), or tofu instead of meat in pasta, casseroles, and soups. Meal planning   Eat less salt, if told by your dietitian. To do this: ? Avoid eating processed or premade food. ? Avoid eating fast food.  Eat less animal protein, including cheese, meat, poultry, or fish, if told by your dietitian. To do this: ? Limit the number of times you have meat, poultry, fish, or cheese each week. Eat a diet free of meat at least 2 days a week. ? Eat only one serving each day of meat, poultry, fish, or seafood. ? When you prepare animal protein, cut pieces into small portion sizes. For most meat and fish, one serving is about the size of one deck of cards.  Eat at least 5 servings of fresh fruits and vegetables each day. To do this: ? Keep fruits and vegetables on hand for snacks. ? Eat 1 piece of fruit or a handful of berries with breakfast. ? Have a salad and fruit at lunch. ? Have two kinds of vegetables at dinner.  Limit foods that are high in a substance called oxalate. These include: ? Spinach. ? Rhubarb. ? Beets. ? Potato chips and french fries. ? Nuts.  If you regularly take a diuretic medicine, make sure to eat at least 1-2 fruits or vegetables high in potassium each day. These include: ? Avocado. ? Banana. ? Orange, prune, carrot, or tomato juice. ? Baked potato. ? Cabbage. ? Beans and split   peas. General instructions   Drink enough fluid to keep your urine clear or pale yellow. This is the most important thing you can do.  Talk to your health care provider and dietitian about taking daily supplements. Depending on your health and the cause of your kidney stones, you may be advised: ? Not to take supplements with vitamin C. ? To take a calcium supplement. ? To take a daily probiotic supplement. ? To take other supplements such as magnesium, fish oil, or vitamin B6.  Take all medicines and supplements as told by your health care provider.  Limit alcohol intake to no  more than 1 drink a day for nonpregnant women and 2 drinks a day for men. One drink equals 12 oz of beer, 5 oz of wine, or 1 oz of hard liquor.  Lose weight if told by your health care provider. Work with your dietitian to find strategies and an eating plan that works best for you. What foods are not recommended? Limit your intake of the following foods, or as told by your dietitian. Talk to your dietitian about specific foods you should avoid based on the type of kidney stones and your overall health. Grains Breads. Bagels. Rolls. Baked goods. Salted crackers. Cereal. Pasta. Vegetables Spinach. Rhubarb. Beets. Canned vegetables. Angie Fava. Olives. Meats and other protein foods Nuts. Nut butters. Large portions of meat, poultry, or fish. Salted or cured meats. Deli meats. Hot dogs. Sausages. Dairy Cheese. Beverages Regular soft drinks. Regular vegetable juice. Seasonings and other foods Seasoning blends with salt. Salad dressings. Canned soups. Soy sauce. Ketchup. Barbecue sauce. Canned pasta sauce. Casseroles. Pizza. Lasagna. Frozen meals. Potato chips. [place] fries. Summary  You can reduce your risk of kidney stones by making changes to your diet.  The most important thing you can do is drink enough fluid. You should drink enough fluid to keep your urine clear or pale yellow.  Ask your health care provider or dietitian how much protein from animal sources you should eat each day, and also how much salt and calcium you should have each day. This information is not intended to replace advice given to you by your health care provider. Make sure you discuss any questions you have with your health care provider. Document Revised: 05/06/2018 Document Reviewed: 12/26/2015 Elsevier Patient Education  [place]. Kegel Exercises  Kegel exercises can help strengthen your pelvic floor muscles. The pelvic floor is a group of muscles that support your rectum, small intestine, and bladder. In  females, pelvic floor muscles also help support the womb (uterus). These muscles help you control the flow of urine and stool. Kegel exercises are painless and simple, and they do not require any equipment. Your provider may suggest Kegel exercises to:  Improve bladder and bowel control.  Improve sexual response.  Improve weak pelvic floor muscles after surgery to remove the uterus (hysterectomy) or pregnancy (females).  Improve weak pelvic floor muscles after prostate gland removal or surgery (males). Kegel exercises involve squeezing your pelvic floor muscles, which are the same muscles you squeeze when you try to stop the flow of urine or keep from passing gas. The exercises can be done while sitting, standing, or lying down, but it is best to vary your position. Exercises How to do Kegel exercises: 1. Squeeze your pelvic floor muscles tight. You should feel a tight lift in your rectal area. If you are a male, you should also feel a tightness in your vaginal area. Keep your stomach, buttocks, and  legs relaxed. 2. Hold the muscles tight for up to 10 seconds. 3. Breathe normally. 4. Relax your muscles. 5. Repeat as told by your health care provider. Repeat this exercise daily as told by your health care provider. Continue to do this exercise for at least 4-6 weeks, or for as long as told by your health care provider. You may be referred to a physical therapist who can help you learn more about how to do Kegel exercises. Depending on your condition, your health care provider may recommend:  Varying how long you squeeze your muscles.  Doing several sets of exercises every day.  Doing exercises for several weeks.  Making Kegel exercises a part of your regular exercise routine. This information is not intended to replace advice given to you by your health care provider. Make sure you discuss any questions you have with your health care provider. Document Revised: 09/03/2017 Document  Reviewed: 09/03/2017 Elsevier Patient Education  [place].

## 2019-05-12 LAB — URINE CULTURE: Culture: NO GROWTH

## 2019-05-13 ENCOUNTER — Telehealth: Payer: Self-pay | Admitting: Gastroenterology

## 2019-05-13 NOTE — Telephone Encounter (Signed)
Patient called & l/m on v/m to schedule a colonoscopy. I have returned his call & l/m on his v/m. Currently there is not a referral in the system.

## 2019-06-04 ENCOUNTER — Telehealth (INDEPENDENT_AMBULATORY_CARE_PROVIDER_SITE_OTHER): Payer: Self-pay | Admitting: Gastroenterology

## 2019-06-04 DIAGNOSIS — Z86010 Personal history of colonic polyps: Secondary | ICD-10-CM

## 2019-06-04 NOTE — Progress Notes (Signed)
Gastroenterology Pre-Procedure Review  Request Date: Friday 06/25/19 Requesting Physician: Dr. Bonna Gains  PATIENT REVIEW QUESTIONS: The patient responded to the following health history questions as indicated:    1. Are you having any GI issues? no 2. Do you have a personal history of Polyps? yes (2015 with Dr. Vira Agar.  History of colon polyps) 3. Do you have a family history of Colon Cancer or Polyps? no 4. Diabetes Mellitus? no 5. Joint replacements in the past 12 months?no 6. Major health problems in the past 3 months?no 7. Any artificial heart valves, MVP, or defibrillator?yes (Patient has pacemaker.  Cardiologist is Dr. Ubaldo Glassing.  Will send Cardiac Clearance.)    MEDICATIONS & ALLERGIES:    Patient reports the following regarding taking any anticoagulation/antiplatelet therapy:   Plavix, Coumadin, Eliquis, Xarelto, Lovenox, Pradaxa, Brilinta, or Effient? no Aspirin? no  Patient confirms/reports the following medications:  Current Outpatient Medications  Medication Sig Dispense Refill  . albuterol (VENTOLIN HFA) 108 (90 Base) MCG/ACT inhaler Inhale into the lungs.    Marland Kitchen amLODipine (NORVASC) 10 MG tablet Take 10 mg by mouth every morning.     . benazepril (LOTENSIN) 40 MG tablet Take by mouth.    . Buprenorphine HCl-Naloxone HCl 2-0.5 MG FILM Place under the tongue.    . cholecalciferol (VITAMIN D3) 25 MCG (1000 UT) tablet Take 1,000 Units by mouth daily.    . citalopram (CELEXA) 40 MG tablet Take 40 mg by mouth daily.     . Fluticasone-Umeclidin-Vilant (TRELEGY ELLIPTA) 100-62.5-25 MCG/INH AEPB Inhale 1 puff into the lungs daily.    . hydrALAZINE (APRESOLINE) 25 MG tablet Take 25 mg by mouth 2 (two) times daily.    . metoprolol tartrate (LOPRESSOR) 25 MG tablet Take 25 mg by mouth 2 (two) times daily.     Marland Kitchen oxymetazoline (AFRIN) 0.05 % nasal spray Place 1 spray into both nostrils 2 (two) times daily as needed for congestion.    . pantoprazole (PROTONIX) 40 MG tablet Take 40 mg by  mouth daily.    . sildenafil (REVATIO) 20 MG tablet TAKE 2 TO 5 TABLETS BY MOUTH 1/2 TO 1 HOUR PRIOR TO ACTIVITY (Patient not taking: Reported on 06/04/2019) 50 tablet 3  . sucralfate (CARAFATE) 1 g tablet Take 1 g by mouth 4 (four) times daily.     No current facility-administered medications for this visit.    Patient confirms/reports the following allergies:  Allergies  Allergen Reactions  . Other     Steroid eye drop caused eye irritation  . Propofol Other (See Comments)    URINARY RETENTION REQUIRING CATHETERIZATION  . Propranolol Other (See Comments)    Urinary retention    No orders of the defined types were placed in this encounter.   AUTHORIZATION INFORMATION Primary Insurance: 1D#: Group #:  Secondary Insurance: 1D#: Group #:  SCHEDULE INFORMATION: Date: Friday 06/25/19 Time: Location:[facility]

## 2019-06-09 ENCOUNTER — Telehealth: Payer: Self-pay

## 2019-06-09 NOTE — Telephone Encounter (Signed)
We received cardiac clearance from Dr. Ubaldo Glassing letting Dr. Bonna Gains perform patient's colonoscopy on 06/25/2019. A copy will be in patient's Media.

## 2019-06-21 NOTE — Telephone Encounter (Signed)
Informed endo of this information

## 2019-06-23 ENCOUNTER — Other Ambulatory Visit
Admission: RE | Admit: 2019-06-23 | Discharge: 2019-06-23 | Disposition: A | Discharge status: Home / Self Care | Payer: Medicare HMO | Source: Ambulatory Visit | Attending: Gastroenterology | Admitting: Gastroenterology

## 2019-06-23 ENCOUNTER — Other Ambulatory Visit: Payer: Self-pay

## 2019-06-23 DIAGNOSIS — Z20822 Contact with and (suspected) exposure to covid-19: Secondary | ICD-10-CM | POA: Diagnosis not present

## 2019-06-23 DIAGNOSIS — Z01812 Encounter for preprocedural laboratory examination: Secondary | ICD-10-CM | POA: Insufficient documentation

## 2019-06-23 LAB — SARS CORONAVIRUS 2 (TAT 6-24 HRS): SARS Coronavirus 2: NEGATIVE

## 2019-06-25 ENCOUNTER — Encounter: Payer: Self-pay | Admitting: *Deleted

## 2019-06-25 ENCOUNTER — Ambulatory Visit: Payer: Medicare HMO | Admitting: Anesthesiology

## 2019-06-25 ENCOUNTER — Encounter
Admission: RE | Disposition: A | Discharge status: Home / Self Care | Payer: Self-pay | Source: Home / Self Care | Attending: Gastroenterology

## 2019-06-25 ENCOUNTER — Ambulatory Visit
Admission: RE | Admit: 2019-06-25 | Discharge: 2019-06-25 | Disposition: A | Discharge status: Home / Self Care | Payer: Medicare HMO | Attending: Gastroenterology | Admitting: Gastroenterology

## 2019-06-25 DIAGNOSIS — D122 Benign neoplasm of ascending colon: Secondary | ICD-10-CM | POA: Diagnosis not present

## 2019-06-25 DIAGNOSIS — D125 Benign neoplasm of sigmoid colon: Secondary | ICD-10-CM | POA: Diagnosis not present

## 2019-06-25 DIAGNOSIS — K635 Polyp of colon: Secondary | ICD-10-CM

## 2019-06-25 DIAGNOSIS — Z1211 Encounter for screening for malignant neoplasm of colon: Secondary | ICD-10-CM | POA: Insufficient documentation

## 2019-06-25 DIAGNOSIS — Z860100 Personal history of colon polyps, unspecified: Secondary | ICD-10-CM

## 2019-06-25 DIAGNOSIS — K621 Rectal polyp: Secondary | ICD-10-CM | POA: Diagnosis not present

## 2019-06-25 DIAGNOSIS — D124 Benign neoplasm of descending colon: Secondary | ICD-10-CM | POA: Insufficient documentation

## 2019-06-25 DIAGNOSIS — D123 Benign neoplasm of transverse colon: Secondary | ICD-10-CM | POA: Insufficient documentation

## 2019-06-25 HISTORY — PX: COLONOSCOPY WITH PROPOFOL: SHX5780

## 2019-06-25 SURGERY — COLONOSCOPY WITH PROPOFOL
Anesthesia: General

## 2019-06-25 MED ORDER — PROPOFOL 10 MG/ML IV BOLUS
INTRAVENOUS | Status: DC | PRN
  Administered 2019-06-25: 20 mg via INTRAVENOUS
  Administered 2019-06-25: 50 mg via INTRAVENOUS

## 2019-06-25 MED ORDER — SODIUM CHLORIDE 0.9 % IV SOLN
INTRAVENOUS | Status: DC
  Administered 2019-06-25: 1000 mL via INTRAVENOUS

## 2019-06-25 MED ORDER — PROPOFOL 500 MG/50ML IV EMUL
INTRAVENOUS | Status: DC | PRN
  Administered 2019-06-25: 200 ug/kg/min via INTRAVENOUS

## 2019-06-25 NOTE — Transfer of Care (Signed)
Immediate Anesthesia Transfer of Care Note  Patient: Rane Beauford Canupp  Procedure(s) Performed: COLONOSCOPY WITH PROPOFOL (N/A )  Patient Location: PACU  Anesthesia Type:General  Level of Consciousness: awake, alert  and oriented  Airway & Oxygen Therapy: Patient Spontanous Breathing and Patient connected to nasal cannula oxygen  Post-op Assessment: Report given to RN and Post -op Vital signs reviewed and stable  Post vital signs: Reviewed and stable  Last Vitals:  Vitals Value Taken Time  BP 136/51 06/25/19 1116  Temp    Pulse 69 06/25/19 1117  Resp 12 06/25/19 1116  SpO2 98 % 06/25/19 1117  Vitals shown include unvalidated device data.  Last Pain:  Vitals:   06/25/19 0950  TempSrc: Temporal  PainSc: 0-No pain         Complications: No apparent anesthesia complications

## 2019-06-25 NOTE — Anesthesia Procedure Notes (Signed)
Date/Time: 06/25/2019 10:36 AM Performed by: Nelda Marseille, CRNA Pre-anesthesia Checklist: Patient identified, Emergency Drugs available, Suction available, Patient being monitored and Timeout performed Oxygen Delivery Method: Nasal cannula

## 2019-06-25 NOTE — H&P (Signed)
Vonda Antigua, MD [address] [place], [place], [place], [address] [id number], [place], [place], [place], [address] Phone: [phone]  Fax: [phone]  Primary Care Physician:  Sofie Hartigan, MD   Pre-Procedure History & Physical: HPI:  Richard Duncan is a 69 y.o. male is here for a colonoscopy.   Past Medical History:  Diagnosis Date  . Anemia   . Anxiety   . Arthritis   . 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 Bilateral carpal tunnel syndrome   . Cancer [facility]) 2011   Prostate Ca  . Complication of anesthesia    urinary retention with Popanol  . COPD (chronic obstructive pulmonary disease) ([place])   . Depression   . Difficult intubation    see anesth note 12/10/16  . Dyspnea    RECENT-SUPPOSED TO HAVE STRESS TEST 09-26-16 WITH DR Ubaldo Glassing BUT CANCELLED DUE TO STENT PLACEMENT ON 09-27-16  . Dysrhythmia   . ED (erectile dysfunction)   . GERD (gastroesophageal reflux disease)    rare  . Hernia, hiatal   . History of blood product transfusion   . History of hiatal hernia   . History of kidney stones   . Hyperlipemia   . Hypertension   . Neck stiffness    s/p surgery  . Neuropathy    legs  . Obesity   . Over weight   . Presence of permanent cardiac pacemaker    [employer]tronic  . Prostatitis   . PTSD (post-traumatic stress disorder)   . Sleep apnea    OSA--use Bi-PAP  . Spinal cord stimulator status    Pt reportd non-functional.  "Battery died"  . Urinary retention     Past Surgical History:  Procedure Laterality Date  . APPENDECTOMY    . [place], 2006   Lumbar Spine  . BACK SURGERY     x2 Laminectomy  . CARDIAC PACEMAKER PLACEMENT    . CATARACT EXTRACTION W/PHACO Right 12/28/2018   Procedure: CATARACT EXTRACTION PHACO AND INTRAOCULAR LENS PLACEMENT ([facility]) RIGHT 2.82  00:26.4;  Surgeon: Eulogio Bear, MD;  Location: [place];  Service: Ophthalmology;  Laterality: Right;  . CATARACT EXTRACTION W/PHACO Left 01/25/2019   Procedure:  CATARACT EXTRACTION PHACO AND INTRAOCULAR LENS PLACEMENT ([place]) LEFT;  Surgeon: Eulogio Bear, MD;  Location: [place];  Service: Ophthalmology;  Laterality: Left;  CDE 11.49 [place] 0:24.4  . CERVICAL FUSION    . CERVICAL SPINE SURGERY N/A 2008  . COLONOSCOPY    . CYSTOSCOPY W/ URETERAL STENT PLACEMENT Bilateral 11/01/2016   Procedure: CYSTOSCOPY WITH STENT REPLACEMENT;  Surgeon: [name], MD;  Location: [facility];  Service: Urology;  Laterality: Bilateral;  . CYSTOSCOPY W/ URETERAL STENT PLACEMENT Left 12/10/2016   Procedure: CYSTOSCOPY WITH STENT REPLACEMENT;  Surgeon: Abbie Sons, MD;  Location: [facility];  Service: Urology;  Laterality: Left;  . CYSTOSCOPY W/ URETERAL STENT REMOVAL Bilateral 11/27/2016   Procedure: CYSTOSCOPY WITH STENT REMOVAL/EXCHANGE;  Surgeon: [name], MD;  Location: [facility];  Service: Urology;  Laterality: Bilateral;  . CYSTOSCOPY WITH STENT PLACEMENT Bilateral 09/27/2016   Procedure: CYSTOSCOPY WITH STENT PLACEMENT;  Surgeon: [name], MD;  Location: [facility];  Service: Urology;  Laterality: Bilateral;  . EXCISION OF ABDOMINAL WALL TUMOR N/A 03/08/2016   Procedure: EXCISION OF ABDOMINAL WALL MASS;  Surgeon: Leonie Green, MD;  Location: [facility];  Service: General;  Laterality: N/A;  . HOLEP-LASER ENUCLEATION OF THE PROSTATE WITH MORCELLATION N/A 09/25/2018  Procedure: HOLEP-LASER ENUCLEATION OF THE PROSTATE WITH MORCELLATION;  Surgeon: Billey Co, MD;  Location: [facility];  Service: Urology;  Laterality: N/A;  . JOINT REPLACEMENT  2004, 2005, 2014, 2015   Bilat Hip Replacements  . SPINAL CORD STIMULATOR INSERTION N/A 07/08/2014   Procedure: LUMBAR SPINAL CORD STIMULATOR INSERTION;  Surgeon: Clydell Hakim, MD;  Location: [place];  Service: Neurosurgery;  Laterality: N/A;  LUMBAR SPINAL CORD STIMULATOR INSERTION  . TONSILLECTOMY    . URETEROSCOPY WITH HOLMIUM LASER LITHOTRIPSY Bilateral 11/01/2016   Procedure:  URETEROSCOPY WITH HOLMIUM LASER LITHOTRIPSY;  Surgeon: [name], MD;  Location: [facility];  Service: Urology;  Laterality: Bilateral;  . URETEROSCOPY WITH HOLMIUM LASER LITHOTRIPSY Left 12/10/2016   Procedure: URETEROSCOPY WITH HOLMIUM LASER LITHOTRIPSY;  Surgeon: Abbie Sons, MD;  Location: [facility];  Service: Urology;  Laterality: Left;  . URETEROSCOPY WITH HOLMIUM LASER LITHOTRIPSY Left 11/27/2016   Procedure: URETEROSCOPY WITH HOLMIUM LASER LITHOTRIPSY;  Surgeon: [name], MD;  Location: [facility];  Service: Urology;  Laterality: Left;    Prior to Admission medications   Medication Sig Start Date End Date Taking? Authorizing Provider  amLODipine (NORVASC) 10 MG tablet Take 10 mg by mouth every morning.  06/08/14  Yes [provider]  benazepril (LOTENSIN) 40 MG tablet Take by mouth. 03/22/19  Yes [provider]  cholecalciferol (VITAMIN D3) 25 MCG (1000 UT) tablet Take 1,000 Units by mouth daily.   Yes [provider]  citalopram (CELEXA) 40 MG tablet Take 40 mg by mouth daily.  03/31/17  Yes [provider]  Fluticasone-Umeclidin-Vilant (TRELEGY ELLIPTA) 100-62.5-25 MCG/INH AEPB Inhale 1 puff into the lungs daily.   Yes [provider]  hydrALAZINE (APRESOLINE) 25 MG tablet Take 25 mg by mouth 2 (two) times daily.   Yes [provider]  metoprolol tartrate (LOPRESSOR) 25 MG tablet Take 25 mg by mouth 2 (two) times daily.  08/05/16  Yes [provider]  oxymetazoline (AFRIN) 0.05 % nasal spray Place 1 spray into both nostrils 2 (two) times daily as needed for congestion.   Yes [provider]  pantoprazole (PROTONIX) 40 MG tablet Take 40 mg by mouth daily. 05/08/19  Yes [provider]  sucralfate (CARAFATE) 1 g tablet Take 1 g by mouth 4 (four) times daily. 04/15/19  Yes [provider]  albuterol (VENTOLIN HFA) 108 (90 Base) MCG/ACT inhaler Inhale into the lungs. 04/09/19 04/08/20   [provider]  Buprenorphine HCl-Naloxone HCl 2-0.5 MG FILM Place under the tongue. 03/29/19   [provider]  sildenafil (REVATIO) 20 MG tablet TAKE 2 TO 5 TABLETS BY MOUTH 1/2 TO 1 HOUR PRIOR TO ACTIVITY Patient not taking: Reported on 06/04/2019 04/27/19   Zara Council A, PA-C    Allergies as of 06/04/2019 - Review Complete 06/04/2019  Allergen Reaction Noted  . Other  12/17/2018  . Propofol Other (See Comments) 06/18/2014  . Propranolol Other (See Comments) 07/07/2014    Family History  Problem Relation Age of Onset  . Benign prostatic hyperplasia Father   . Prostate cancer Father   . Hypertension Mother   . Kidney disease Neg Hx   . Kidney cancer Neg Hx   . Bladder Cancer Neg Hx     Social History   Socioeconomic History  . Marital status: Married    Spouse name: Not on file  . Number of children: Not on file  . Years of education: Not on file  . Highest education level:  Not on file  Occupational History  . Not on file  Tobacco Use  . Smoking status: Former Smoker    Years: 2.00    Types: Cigarettes    Quit date: 06/18/1966    Years since quitting: 53.0  . Smokeless tobacco: Never Used  . Tobacco comment: quit 40 years ago  Substance and Sexual Activity  . Alcohol use: Yes    Alcohol/week: 0.0 standard drinks    Comment: Occasional  . Drug use: No  . Sexual activity: Not Currently  Other Topics Concern  . Not on file  Social History Narrative  . Not on file   Social Determinants of Health   Financial Resource Strain:   . Difficulty of Paying Living Expenses:   Food Insecurity:   . Worried About Charity fundraiser in the Last Year:   . Arboriculturist in the Last Year:   Transportation Needs:   . Film/video editor (Medical):   Marland Kitchen Lack of Transportation (Non-Medical):   Physical Activity:   . Days of Exercise per Week:   . Minutes of Exercise per Session:   Stress:   . Feeling of Stress :   Social Connections:   .  Frequency of Communication with Friends and Family:   . Frequency of Social Gatherings with Friends and Family:   . Attends Religious Services:   . Active Member of Clubs or Organizations:   . Attends Archivist Meetings:   Marland Kitchen Marital Status:   Intimate Partner Violence:   . Fear of Current or Ex-Partner:   . Emotionally Abused:   Marland Kitchen Physically Abused:   . Sexually Abused:     Review of Systems: See HPI, otherwise negative ROS  Physical Exam: BP (!) 158/81   Pulse 75   Temp (!) 97.3 F (36.3 C) (Temporal)   Resp 20   Ht 6' (1.829 m)   Wt (!) 163.6 kg   SpO2 98%   BMI 48.92 kg/m  General:   Alert,  pleasant and cooperative in NAD Head:  Normocephalic and atraumatic. Neck:  Supple; no masses or thyromegaly. Lungs:  Clear throughout to auscultation, normal respiratory effort.    Heart:  +S1, +S2, Regular rate and rhythm, No edema. Abdomen:  Soft, nontender and nondistended. Normal bowel sounds, without guarding, and without rebound.   Neurologic:  Alert and  oriented x4;  grossly normal neurologically.  Impression/Plan: Richard Duncan is here for a colonoscopy to be performed for history of polyps.  Patient had a colonoscopy in 2006 where polyps removed by Dr. Vira Agar.  Pathology report not available.  2015 colonoscopy did not report any polyps, but a submucosal lipoma was biopsied and pathology report is not showing any submucosal tissue.  Risks, benefits, limitations, and alternatives regarding  colonoscopy have been reviewed with the patient.  Questions have been answered.  All parties agreeable.   Virgel Manifold, MD  06/25/2019, 9:58 AM

## 2019-06-25 NOTE — Anesthesia Preprocedure Evaluation (Addendum)
Anesthesia Evaluation  Patient identified by MRN, date of birth, ID band Patient awake    Reviewed: Allergy & Precautions, H&P , NPO status , Patient's Chart, lab work & pertinent test results, reviewed documented beta blocker date and time   History of Anesthesia Complications (+) DIFFICULT AIRWAY and history of anesthetic complications  Airway Mallampati: III  TM Distance: >3 FB Neck ROM: full    Dental no notable dental hx.    Pulmonary shortness of breath, sleep apnea and Continuous Positive Airway Pressure Ventilation , COPD, former smoker,    Pulmonary exam normal breath sounds clear to auscultation       Cardiovascular Exercise Tolerance: Good hypertension, Normal cardiovascular exam+ dysrhythmias + pacemaker  Rhythm:regular Rate:Normal     Neuro/Psych PSYCHIATRIC DISORDERS Anxiety Depression negative neurological ROS     GI/Hepatic Neg liver ROS, hiatal hernia, GERD  Controlled,  Endo/Other  negative endocrine ROS  Renal/GU negative Renal ROS     Musculoskeletal   Abdominal   Peds  Hematology negative hematology ROS (+) anemia ,   Anesthesia Other Findings   Reproductive/Obstetrics negative OB ROS                             Anesthesia Physical  Anesthesia Plan  ASA: III  Anesthesia Plan: General   Post-op Pain Management:    Induction:   PONV Risk Score and Plan: TIVA  Airway Management Planned: Nasal Cannula  Additional Equipment:   Intra-op Plan:   Post-operative Plan:   Informed Consent: I have reviewed the patients History and Physical, chart, labs and discussed the procedure including the risks, benefits and alternatives for the proposed anesthesia with the patient or authorized representative who has indicated his/her understanding and acceptance.     Dental Advisory Given  Plan Discussed with: CRNA  Anesthesia Plan Comments: (Talked with patient about  urinary retention after surgeries where propofol was used.  Discussed the rather long case times of previous surgeries as being a factor in urinary retention, but also acknowledging the possibility of untoward reactions to meds.  The patient is aggreable to using propofol.)       Anesthesia Quick Evaluation

## 2019-06-25 NOTE — Op Note (Signed)
[facility] Gastroenterology Patient Name: Richard Duncan Procedure Date: 06/25/2019 10:26 AM MRN: [id number] Account #: [id number] Date of Birth: 01-18-1951 Admit Type: Outpatient Age: 69 Room: [facility] ENDO ROOM 4 Gender: Male Note Status: Finalized Procedure:             Colonoscopy Indications:           High risk colon cancer surveillance: Personal history                         of colonic polyps Providers:             Alliah Boulanger B. Bonna Gains MD, MD Medicines:             Monitored Anesthesia Care Complications:         No immediate complications. Procedure:             Pre-Anesthesia Assessment:                        - ASA Grade Assessment: II - A patient with mild                         systemic disease.                        - Prior to the procedure, a History and Physical was                         performed, and patient medications, allergies and                         sensitivities were reviewed. The patient's tolerance                         of previous anesthesia was reviewed.                        - The risks and benefits of the procedure and the                         sedation options and risks were discussed with the                         patient. All questions were answered and informed                         consent was obtained.                        - Patient identification and proposed procedure were                         verified prior to the procedure by the physician, the                         nurse, the anesthesiologist, the anesthetist and the                         technician. The procedure was verified in the  procedure room.                        After obtaining informed consent, the colonoscope was                         passed under direct vision. Throughout the procedure,                         the patient's blood pressure, pulse, and oxygen                         saturations were monitored  continuously. The                         Colonoscope was introduced through the anus and                         advanced to the the cecum, identified by appendiceal                         orifice and ileocecal valve. The colonoscopy was                         performed with ease. The patient tolerated the                         procedure well. The quality of the bowel preparation                         was fair. Corn was present in the colon which was                         moved for visualization underneath. Findings:      The perianal and digital rectal examinations were normal.      A 6 mm polyp was found in the transverse colon. The polyp was flat. The       polyp was removed with a cold snare. Resection and retrieval were       complete.      Four sessile polyps were found in the rectum and sigmoid colon. The       polyps were 3 to 4 mm in size. These polyps were removed with a cold       biopsy forceps. Resection and retrieval were complete.      The exam was otherwise without abnormality.      The rectum, sigmoid colon, descending colon, transverse colon, ascending       colon and cecum appeared normal.      The retroflexed view of the distal rectum and anal verge was normal and       showed no anal or rectal abnormalities. Impression:            - One 6 mm polyp in the transverse colon, removed with                         a cold snare. Resected and retrieved.                        - Four 3 to 4 mm  polyps in the rectum and in the                         sigmoid colon, removed with a cold biopsy forceps.                         Resected and retrieved.                        - The examination was otherwise normal.                        - The rectum, sigmoid colon, descending colon,                         transverse colon, ascending colon and cecum are normal.                        - The distal rectum and anal verge are normal on                         retroflexion  view. Recommendation:        - Discharge patient to home (with escort).                        - Advance diet as tolerated.                        - Continue present medications.                        - Await pathology results.                        - - Repeat colonoscopy in 3 years, due to fair prep                         (suctioned well and solid colon material moved to                         visualize mucosa underneath it).                        - The findings and recommendations were discussed with                         the patient.                        - The findings and recommendations were discussed with                         the patient's family.                        - Return to primary care physician as previously                         scheduled. Procedure Code(s):     --- Professional ---  45385, Colonoscopy, flexible; with removal of                         tumor(s), polyp(s), or other lesion(s) by snare                         technique                        45380, 59, Colonoscopy, flexible; with biopsy, single                         or multiple Diagnosis Code(s):     --- Professional ---                        Z86.010, Personal history of colonic polyps                        K63.5, Polyp of colon                        K62.1, Rectal polyp CPT copyright 2019 American Medical Association. All rights reserved. The codes documented in this report are preliminary and upon coder review may  be revised to meet current compliance requirements.  Vonda Antigua, MD Margretta Sidle B. Bonna Gains MD, MD 06/25/2019 11:16:23 AM This report has been signed electronically. Number of Addenda: 0 Note Initiated On: 06/25/2019 10:26 AM Scope Withdrawal Time: 0 hours 27 minutes 50 seconds  Total Procedure Duration: 0 hours 34 minutes 49 seconds  Estimated Blood Loss:  Estimated blood loss: none.      [employer]

## 2019-06-29 ENCOUNTER — Encounter: Payer: Self-pay | Admitting: Gastroenterology

## 2019-06-29 LAB — SURGICAL PATHOLOGY

## 2019-06-29 NOTE — Anesthesia Postprocedure Evaluation (Signed)
Anesthesia Post Note  Patient: Richard Duncan  Procedure(s) Performed: COLONOSCOPY WITH PROPOFOL (N/A )  Patient location during evaluation: Endoscopy Anesthesia Type: General Level of consciousness: awake and alert and oriented Pain management: pain level controlled Vital Signs Assessment: post-procedure vital signs reviewed and stable Respiratory status: spontaneous breathing Cardiovascular status: blood pressure returned to baseline Anesthetic complications: no     Last Vitals:  Vitals:   06/25/19 0950 06/25/19 1115  BP: (!) 158/81   Pulse: 75   Resp: 20   Temp: (!) 36.3 C (!) 36.1 C  SpO2: 98% 94%    Last Pain:  Vitals:   06/25/19 1145  TempSrc:   PainSc: 0-No pain                 Rhealynn Myhre

## 2019-08-14 ENCOUNTER — Encounter: Payer: Self-pay | Admitting: Emergency Medicine

## 2019-08-14 ENCOUNTER — Emergency Department
Admission: EM | Admit: 2019-08-14 | Discharge: 2019-08-14 | Disposition: A | Discharge status: Home / Self Care | Payer: Medicare HMO | Attending: Emergency Medicine | Admitting: Emergency Medicine

## 2019-08-14 ENCOUNTER — Other Ambulatory Visit: Payer: Self-pay

## 2019-08-14 ENCOUNTER — Emergency Department: Payer: Medicare HMO

## 2019-08-14 DIAGNOSIS — I10 Essential (primary) hypertension: Secondary | ICD-10-CM | POA: Insufficient documentation

## 2019-08-14 DIAGNOSIS — Z8546 Personal history of malignant neoplasm of prostate: Secondary | ICD-10-CM | POA: Insufficient documentation

## 2019-08-14 DIAGNOSIS — J441 Chronic obstructive pulmonary disease with (acute) exacerbation: Secondary | ICD-10-CM | POA: Insufficient documentation

## 2019-08-14 DIAGNOSIS — Z7951 Long term (current) use of inhaled steroids: Secondary | ICD-10-CM | POA: Diagnosis not present

## 2019-08-14 DIAGNOSIS — R109 Unspecified abdominal pain: Secondary | ICD-10-CM | POA: Diagnosis present

## 2019-08-14 DIAGNOSIS — Z79899 Other long term (current) drug therapy: Secondary | ICD-10-CM | POA: Diagnosis not present

## 2019-08-14 DIAGNOSIS — N200 Calculus of kidney: Secondary | ICD-10-CM

## 2019-08-14 DIAGNOSIS — Z87891 Personal history of nicotine dependence: Secondary | ICD-10-CM | POA: Diagnosis not present

## 2019-08-14 LAB — COMPREHENSIVE METABOLIC PANEL
ALT: 23 U/L (ref 0–44)
AST: 21 U/L (ref 15–41)
Albumin: 4.2 g/dL (ref 3.5–5.0)
Alkaline Phosphatase: 95 U/L (ref 38–126)
Anion gap: 12 (ref 5–15)
BUN: 24 mg/dL — ABNORMAL HIGH (ref 8–23)
CO2: 26 mmol/L (ref 22–32)
Calcium: 9.2 mg/dL (ref 8.9–10.3)
Chloride: 102 mmol/L (ref 98–111)
Creatinine, Ser: 1.41 mg/dL — ABNORMAL HIGH (ref 0.61–1.24)
GFR calc Af Amer: 59 mL/min — ABNORMAL LOW (ref >60)
GFR calc non Af Amer: 51 mL/min — ABNORMAL LOW (ref >60)
Glucose, Bld: 127 mg/dL — ABNORMAL HIGH (ref 70–99)
Potassium: 4.2 mmol/L (ref 3.5–5.1)
Sodium: 140 mmol/L (ref 135–145)
Total Bilirubin: 0.9 mg/dL (ref 0.3–1.2)
Total Protein: 7.9 g/dL (ref 6.5–8.1)

## 2019-08-14 LAB — CBC
HCT: 49.3 % (ref 39.0–52.0)
Hemoglobin: 16.7 g/dL (ref 13.0–17.0)
MCH: 30.6 pg (ref 26.0–34.0)
MCHC: 33.9 g/dL (ref 30.0–36.0)
MCV: 90.5 fL (ref 80.0–100.0)
Platelets: 221 10*3/uL (ref 150–400)
RBC: 5.45 MIL/uL (ref 4.22–5.81)
RDW: 12.9 % (ref 11.5–15.5)
WBC: 14 10*3/uL — ABNORMAL HIGH (ref 4.0–10.5)
nRBC: 0 % (ref 0.0–0.2)

## 2019-08-14 LAB — URINALYSIS, COMPLETE (UACMP) WITH MICROSCOPIC
Bacteria, UA: NONE SEEN
Bilirubin Urine: NEGATIVE
Glucose, UA: NEGATIVE mg/dL
Ketones, ur: NEGATIVE mg/dL
Leukocytes,Ua: NEGATIVE
Nitrite: NEGATIVE
Protein, ur: 30 mg/dL — AB
RBC / HPF: >50 RBC/hpf — ABNORMAL HIGH (ref 0–5)
Specific Gravity, Urine: 1.014 (ref 1.005–1.030)
WBC, UA: >50 WBC/hpf — ABNORMAL HIGH (ref 0–5)
pH: 5 (ref 5.0–8.0)

## 2019-08-14 MED ORDER — HYDROMORPHONE HCL 1 MG/ML IJ SOLN
1.0000 mg | Freq: Once | INTRAMUSCULAR | Status: AC
  Administered 2019-08-14: 1 mg via INTRAMUSCULAR
  Filled 2019-08-14: qty 1

## 2019-08-14 MED ORDER — ONDANSETRON HCL 4 MG PO TABS: 4.0000 mg | ORAL_TABLET | Freq: Every day | ORAL | 0 refills | Status: DC | PRN

## 2019-08-14 MED ORDER — ONDANSETRON 4 MG PO TBDP
4.0000 mg | ORAL_TABLET | Freq: Once | ORAL | Status: AC
  Administered 2019-08-14: 4 mg via ORAL
  Filled 2019-08-14: qty 1

## 2019-08-14 MED ORDER — TAMSULOSIN HCL 0.4 MG PO CAPS
0.4000 mg | ORAL_CAPSULE | Freq: Every day | ORAL | 0 refills | Status: DC
Start: 2019-08-14 — End: 2019-11-16

## 2019-08-14 NOTE — ED Triage Notes (Signed)
Patient with complaint of right flank pain with nausea times 6 weeks. Patient states that the pain has become worse. Patient states that he has a history of kidney stones and the pain feels the same.

## 2019-08-14 NOTE — ED Notes (Signed)
Pt taken to CT.  Called pt wife and informed her she can come in to ER at this time.

## 2019-08-14 NOTE — ED Provider Notes (Signed)
[facility] Emergency Department Provider Note  Time seen: 9:23 AM  I have reviewed the triage vital signs and the nursing notes.   HISTORY  Chief Complaint Flank Pain   HPI CHENG DEC is a 69 y.o. male with a past medical history of anemia, COPD, hypertension, hyperlipidemia, obesity, kidney stones presents to the emergency department for right flank pain. According to the patient over the past 6 weeks he has been experiencing intermittent right flank pain and nausea. Patient states that became acutely worse overnight last night with severe sharp pain in the right\right flank. Patient states nausea but denies any vomiting. No diarrhea. No hematuria or dysuria. No fever.   Past Medical History:  Diagnosis Date  . Anemia   . Anxiety   . Arthritis   . 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 Bilateral carpal tunnel syndrome   . Cancer [facility]) 2011   Prostate Ca  . Complication of anesthesia    urinary retention with Popanol  . COPD (chronic obstructive pulmonary disease) ([place])   . Depression   . Difficult intubation    see anesth note 12/10/16  . Dyspnea    RECENT-SUPPOSED TO HAVE STRESS TEST 09-26-16 WITH DR Ubaldo Glassing BUT CANCELLED DUE TO STENT PLACEMENT ON 09-27-16  . Dysrhythmia   . ED (erectile dysfunction)   . GERD (gastroesophageal reflux disease)    rare  . Hernia, hiatal   . History of blood product transfusion   . History of hiatal hernia   . History of kidney stones   . Hyperlipemia   . Hypertension   . Neck stiffness    s/p surgery  . Neuropathy    legs  . Obesity   . Over weight   . Presence of permanent cardiac pacemaker    Medtronic  . Prostatitis   . PTSD (post-traumatic stress disorder)   . Sleep apnea    OSA--use Bi-PAP  . Spinal cord stimulator status    Pt reportd non-functional.  "Battery died"  . Urinary retention     Patient Active Problem List   Diagnosis Date Noted  . Personal history of colonic polyps   . Polyp of transverse  colon   . Rectal polyp   . COPD with acute exacerbation ([place]) 01/27/2018  . Pain medication agreement signed 04/15/2017  . Spondylosis of lumbar region without myelopathy or radiculopathy 01/20/2017  . Neck pain 01/20/2017  . Numbness and tingling 09/20/2016  . Cervical spondylosis 08/07/2016  . Numbness of upper extremity (Bilateral) 07/30/2016  . Chronic cervical radiculopathy (Bilateral) 07/30/2016  . Carpal tunnel syndrome (Bilateral) 07/30/2016  . Cervical facet syndrome (Bilateral) 07/30/2016  . Chronic shoulder pain (Bilateral) (L>R) 06/18/2016  . Cervicogenic headache (Bilateral) (L>R) 06/18/2016  . Lumbar lateral recess stenosis (Bilateral: L2-3, L3-4, and L4-5) 06/18/2016  . Lumbar facet syndrome (Bilateral) (R>L) 06/18/2016  . 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 Neurogenic pain 06/18/2016  . Neuropathic pain 06/18/2016  . Thoracic facet arthropathy (Bilateral: T7-8, T8-9, and T9-10) 06/18/2016  . DDD (degenerative disc disease), lumbar 06/18/2016  . DDD (degenerative disc disease), cervical 06/18/2016  . DDD (degenerative disc disease), thoracic 06/18/2016  . Neuropathy 04/16/2016  . Elevated C-reactive protein (CRP) 04/10/2016  . Chronic pain syndrome 03/11/2016  . Chronic prescription benzodiazepine use 03/11/2016  . Lower extremity neuropathy (Bilateral) 03/11/2016  . Chronic hip pain (Primary Source of Pain) (Bilateral) (R>L) 03/11/2016  . 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 Chronic low  back pain (Secondary source of pain) (Bilateral) (R>L) 03/11/2016  . Chronic neck pain [facility] source of pain) (Midline) (Bilateral) 03/11/2016  . Disturbance of skin sensation 03/11/2016  . Lumbar facet arthropathy (Bilateral: L12, L2-3, L3-4, L4-5, L5-S1) 03/11/2016  . Lumbar foraminal stenosis (Bilateral: L2-3, L3-4, and L4-5) 03/11/2016  . Thoracic foraminal stenosis (Right: T8-9 and T9-10)  03/11/2016  . Lumbar spondylosis 03/11/2016  . Osteoarthritis 03/11/2016  . Thoracic spondylosis 03/11/2016  . Medtronic spinal cord stimulator 03/11/2016  . Acquired heart block 12/07/2015  . 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 Lower urinary tract symptoms 09/13/2014  . Anxiety 06/18/2014  . Cardiac disease 06/18/2014  . Hyperlipidemia, unspecified 06/18/2014  . BP (high blood pressure) 06/18/2014  . Prostate cancer ([place]) 06/18/2014  . ED (erectile dysfunction) 06/18/2014  . Prostatitis 06/18/2014  . Obesity 06/18/2014  . Urinary retention 06/18/2014  . Mechanical complication of prosthetic joint implant ([place]) 07/12/2013  . 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 APPENDECTOMY    . [place], 2006   Lumbar Spine  . BACK SURGERY     x2 Laminectomy  . CARDIAC PACEMAKER PLACEMENT    . CATARACT EXTRACTION W/PHACO Right 12/28/2018   Procedure: CATARACT EXTRACTION PHACO AND INTRAOCULAR LENS PLACEMENT (IOC) RIGHT 2.82  00:26.4;  Surgeon: Eulogio Bear, MD;  Location: [place];  Service: Ophthalmology;  Laterality: Right;  . CATARACT EXTRACTION W/PHACO Left 01/25/2019   Procedure: CATARACT EXTRACTION PHACO AND INTRAOCULAR LENS PLACEMENT ([place]) LEFT;  Surgeon: Eulogio Bear, MD;  Location: [place];  Service: Ophthalmology;  Laterality: Left;  CDE 11.49 [place] 0:24.4  . CERVICAL FUSION    . CERVICAL SPINE SURGERY N/A 2008  . COLONOSCOPY    . COLONOSCOPY WITH PROPOFOL N/A 06/25/2019   Procedure: COLONOSCOPY WITH PROPOFOL;  Surgeon: Virgel Manifold, MD;  Location: [facility] ENDOSCOPY;  Service: Endoscopy;  Laterality: N/A;  ALLERGIC TO PROPOFOL PER OFFICE  . CYSTOSCOPY W/ URETERAL STENT PLACEMENT Bilateral 11/01/2016   Procedure: CYSTOSCOPY WITH STENT REPLACEMENT;  Surgeon: [name], MD;  Location: [facility];  Service: Urology;  Laterality:  Bilateral;  . CYSTOSCOPY W/ URETERAL STENT PLACEMENT Left 12/10/2016   Procedure: CYSTOSCOPY WITH STENT REPLACEMENT;  Surgeon: Abbie Sons, MD;  Location: [facility];  Service: Urology;  Laterality: Left;  . CYSTOSCOPY W/ URETERAL STENT REMOVAL Bilateral 11/27/2016   Procedure: CYSTOSCOPY WITH STENT REMOVAL/EXCHANGE;  Surgeon: [name], MD;  Location: [facility];  Service: Urology;  Laterality: Bilateral;  . CYSTOSCOPY WITH STENT PLACEMENT Bilateral 09/27/2016   Procedure: CYSTOSCOPY WITH STENT PLACEMENT;  Surgeon: [name], MD;  Location: [facility];  Service: Urology;  Laterality: Bilateral;  . EXCISION OF ABDOMINAL WALL TUMOR N/A 03/08/2016   Procedure: EXCISION OF ABDOMINAL WALL MASS;  Surgeon: Leonie Green, MD;  Location: [facility];  Service: General;  Laterality: N/A;  . HOLEP-LASER ENUCLEATION OF THE PROSTATE WITH MORCELLATION N/A 09/25/2018   Procedure: HOLEP-LASER ENUCLEATION OF THE PROSTATE WITH MORCELLATION;  Surgeon: Billey Co, MD;  Location: [facility];  Service: Urology;  Laterality: N/A;  . JOINT REPLACEMENT  2004, 2005, 2014, 2015   Bilat Hip Replacements  . SPINAL CORD STIMULATOR INSERTION N/A 07/08/2014   Procedure: LUMBAR SPINAL CORD STIMULATOR INSERTION;  Surgeon: Clydell Hakim, MD;  Location: [place];  Service: Neurosurgery;  Laterality: N/A;  LUMBAR SPINAL CORD STIMULATOR INSERTION  . TONSILLECTOMY    .  URETEROSCOPY WITH HOLMIUM LASER LITHOTRIPSY Bilateral 11/01/2016   Procedure: URETEROSCOPY WITH HOLMIUM LASER LITHOTRIPSY;  Surgeon: [name], MD;  Location: [facility];  Service: Urology;  Laterality: Bilateral;  . URETEROSCOPY WITH HOLMIUM LASER LITHOTRIPSY Left 12/10/2016   Procedure: URETEROSCOPY WITH HOLMIUM LASER LITHOTRIPSY;  Surgeon: Abbie Sons, MD;  Location: [facility];  Service: Urology;  Laterality: Left;  . URETEROSCOPY WITH HOLMIUM LASER LITHOTRIPSY Left 11/27/2016   Procedure: URETEROSCOPY WITH HOLMIUM LASER LITHOTRIPSY;   Surgeon: [name], MD;  Location: [facility];  Service: Urology;  Laterality: Left;    Prior to Admission medications   Medication Sig Start Date End Date Taking? Authorizing Provider  albuterol (VENTOLIN HFA) 108 (90 Base) MCG/ACT inhaler Inhale into the lungs. 04/09/19 04/08/20  [provider]  amLODipine (NORVASC) 10 MG tablet Take 10 mg by mouth every morning.  06/08/14   [provider]  benazepril (LOTENSIN) 40 MG tablet Take by mouth. 03/22/19   [provider]  Buprenorphine HCl-Naloxone HCl 2-0.5 MG FILM Place under the tongue. 03/29/19   [provider]  cholecalciferol (VITAMIN D3) 25 MCG (1000 UT) tablet Take 1,000 Units by mouth daily.    [provider]  citalopram (CELEXA) 40 MG tablet Take 40 mg by mouth daily.  03/31/17   [provider]  Fluticasone-Umeclidin-Vilant (TRELEGY ELLIPTA) 100-62.5-25 MCG/INH AEPB Inhale 1 puff into the lungs daily.    [provider]  hydrALAZINE (APRESOLINE) 25 MG tablet Take 25 mg by mouth 2 (two) times daily.    [provider]  metoprolol tartrate (LOPRESSOR) 25 MG tablet Take 25 mg by mouth 2 (two) times daily.  08/05/16   [provider]  oxymetazoline (AFRIN) 0.05 % nasal spray Place 1 spray into both nostrils 2 (two) times daily as needed for congestion.    [provider]  pantoprazole (PROTONIX) 40 MG tablet Take 40 mg by mouth daily. 05/08/19   [provider]  sildenafil (REVATIO) 20 MG tablet TAKE 2 TO 5 TABLETS BY MOUTH 1/2 TO 1 HOUR PRIOR TO ACTIVITY Patient not taking: Reported on 06/04/2019 04/27/19   Zara Council A, PA-C  sucralfate (CARAFATE) 1 g tablet Take 1 g by mouth 4 (four) times daily. 04/15/19   [provider]    Allergies  Allergen Reactions  . Other     Steroid eye drop caused eye irritation  . Propofol Other (See Comments)    URINARY RETENTION REQUIRING CATHETERIZATION  . Propranolol Other (See Comments)     Urinary retention    Family History  Problem Relation Age of Onset  . Benign prostatic hyperplasia Father   . Prostate cancer Father   . Hypertension Mother   . Kidney disease Neg Hx   . Kidney cancer Neg Hx   . Bladder Cancer Neg Hx     Social History Social History   Tobacco Use  . Smoking status: Former Smoker    Years: 2.00    Types: Cigarettes    Quit date: 06/18/1966    Years since quitting: 53.1  . Smokeless tobacco: Never Used  . Tobacco comment: quit 40 years ago  Vaping Use  . Vaping Use: Never used  Substance Use Topics  . Alcohol use: Yes    Alcohol/week: 0.0 standard drinks    Comment: Occasional  . Drug use: No    Review of Systems Constitutional: Negative for fever Cardiovascular: Negative for chest pain. Respiratory: Negative for shortness of breath. Gastrointestinal: Right flank pain, sharp.  Positive for nausea. Genitourinary: Negative for urinary compaints Musculoskeletal: Right flank/back pain. Neurological: Negative for headache All other ROS negative  ____________________________________________   PHYSICAL EXAM:  VITAL SIGNS: ED Triage Vitals  Enc Vitals Group     BP 08/14/19 0700 (!) 153/82     Pulse Rate 08/14/19 0700 73     Resp 08/14/19 0700 18     Temp 08/14/19 0700 97.8 F (36.6 C)     Temp Source 08/14/19 0700 Oral     SpO2 08/14/19 0700 98 %     Weight 08/14/19 0655 (!) 360 lb (163.3 kg)     Height 08/14/19 0655 6' (1.829 m)     Head Circumference --      Peak Flow --      Pain Score 08/14/19 0655 8     Pain Loc --      Pain Edu? --      Excl. in [place]? --    Constitutional: Alert and oriented. Well appearing and in no distress. Eyes: Normal exam ENT      Head: Normocephalic and atraumatic.      Mouth/Throat: Mucous membranes are moist. Cardiovascular: Normal rate, regular rhythm.  Respiratory: Normal respiratory effort without tachypnea nor retractions. Breath sounds are clear  Gastrointestinal: Soft, slight right  lower quadrant tenderness palpation without rebound guarding or distention. Abdomen otherwise benign. Patient is status post appendectomy. Musculoskeletal: Nontender with normal range of motion in all extremities.  Neurologic:  Normal speech and language. No gross focal neurologic deficits  Skin:  Skin is warm, dry and intact.  Psychiatric: Mood and affect are normal.  ____________________________________________   RADIOLOGY  CT scan shows bilateral nephrolithiasis with mild right hydroureteronephrosis and a small stone noted in the bladder possibly representing a recently passed stone.  ____________________________________________   INITIAL IMPRESSION / ASSESSMENT AND PLAN / ED COURSE  Pertinent labs & imaging results that were available during my care of the patient were reviewed by me and considered in my medical decision making (see chart for details).   Patient presents emergency department for right flank pain intermittent over the past 6 weeks but acutely worse since last night. Patient states a history of kidney stones which this feels similar. Patient's lab work does show a leukocytosis of 14,000 and red and white cells within his urine. We'll send urine culture. We'll obtain a CT renal scan to further evaluate. Patient agreeable to plan of care.  CT appears consistent with a recently passed stone.  Patient states his pain is considerably better although does state some nausea since receiving the pain medication.  We will dose Zofran.  Given the CT findings we will discharge the patient on Flomax and Zofran and have the patient follow-up with urology.  Patient agreeable to plan of care.  Discussed return precautions.  Richard Duncan was evaluated in Emergency Department on 08/14/2019 for the symptoms described in the history of present illness. He was evaluated in the context of the global COVID-19 pandemic, which necessitated consideration that the patient might be at risk for  infection with the SARS-CoV-2 virus that causes COVID-19. Institutional protocols and algorithms that pertain to the evaluation of patients at risk for COVID-19 are in a state of rapid change based on information released by regulatory bodies including the CDC and federal and state organizations. These policies and algorithms were followed during the patient's care in the ED.  ____________________________________________   FINAL CLINICAL IMPRESSION(S) / ED DIAGNOSES  Right flank pain Kidney stone  Harvest Dark, MD 08/14/19 1025

## 2019-08-15 LAB — URINE CULTURE: Culture: NO GROWTH

## 2019-10-07 MED ORDER — SILDENAFIL CITRATE 20 MG PO TABS: ORAL_TABLET | ORAL | 0 refills | Status: DC

## 2019-11-16 ENCOUNTER — Encounter: Payer: Self-pay | Admitting: Urology

## 2019-11-16 ENCOUNTER — Other Ambulatory Visit: Payer: Self-pay

## 2019-11-16 ENCOUNTER — Other Ambulatory Visit
Admission: RE | Admit: 2019-11-16 | Discharge: 2019-11-16 | Disposition: A | Discharge status: Home / Self Care | Payer: Medicare HMO | Attending: Urology | Admitting: Urology

## 2019-11-16 ENCOUNTER — Ambulatory Visit: Payer: Medicare HMO | Admitting: Urology

## 2019-11-16 VITALS — BP 138/72 | HR 83 | Ht 72.0 in | Wt 367.0 lb

## 2019-11-16 DIAGNOSIS — C61 Malignant neoplasm of prostate: Secondary | ICD-10-CM | POA: Diagnosis present

## 2019-11-16 DIAGNOSIS — R109 Unspecified abdominal pain: Secondary | ICD-10-CM | POA: Diagnosis not present

## 2019-11-16 DIAGNOSIS — N200 Calculus of kidney: Secondary | ICD-10-CM | POA: Diagnosis not present

## 2019-11-16 DIAGNOSIS — N138 Other obstructive and reflux uropathy: Secondary | ICD-10-CM

## 2019-11-16 DIAGNOSIS — N529 Male erectile dysfunction, unspecified: Secondary | ICD-10-CM

## 2019-11-16 DIAGNOSIS — N401 Enlarged prostate with lower urinary tract symptoms: Secondary | ICD-10-CM | POA: Diagnosis not present

## 2019-11-16 LAB — PSA: Prostatic Specific Antigen: 1.29 ng/mL (ref 0.00–4.00)

## 2019-11-16 MED ORDER — POTASSIUM CITRATE ER 15 MEQ (1620 MG) PO TBCR: 2.0000 | EXTENDED_RELEASE_TABLET | Freq: Two times a day (BID) | ORAL | 11 refills | Status: DC

## 2019-11-16 NOTE — Patient Instructions (Addendum)
Continue to drink plenty of fluids to prevent stones and help dissolve your uric acid stones.  Take Potassium citrate twice daily to dissolve your stones.   Dietary Guidelines to Help Prevent Kidney Stones Kidney stones are deposits of minerals and salts that form inside your kidneys. Your risk of developing kidney stones may be greater depending on your diet, your lifestyle, the medicines you take, and whether you have certain medical conditions. Most people can reduce their chances of developing kidney stones by following the instructions below. Depending on your overall health and the type of kidney stones you tend to develop, your dietitian may give you more specific instructions. What are tips for following this plan? Reading food labels  Choose foods with "no salt added" or "low-salt" labels. Limit your sodium intake to less than 1500 mg per day.  Choose foods with calcium for each meal and snack. Try to eat about 300 mg of calcium at each meal. Foods that contain 200-500 mg of calcium per serving include: ? 8 oz (237 ml) of milk, fortified nondairy milk, and fortified fruit juice. ? 8 oz (237 ml) of kefir, yogurt, and soy yogurt. ? 4 oz (118 ml) of tofu. ? 1 oz of cheese. ? 1 cup (300 g) of dried figs. ? 1 cup (91 g) of cooked broccoli. ? 1-3 oz can of sardines or mackerel.  Most people need 1000 to 1500 mg of calcium each day. Talk to your dietitian about how much calcium is recommended for you. Shopping  Buy plenty of fresh fruits and vegetables. Most people do not need to avoid fruits and vegetables, even if they contain nutrients that may contribute to kidney stones.  When shopping for convenience foods, choose: ? Whole pieces of fruit. ? Premade salads with dressing on the side. ? Low-fat fruit and yogurt smoothies.  Avoid buying frozen meals or prepared deli foods.  Look for foods with live cultures, such as yogurt and kefir. Cooking  Do not add salt to food when  cooking. Place a salt shaker on the table and allow each person to add his or her own salt to taste.  Use vegetable protein, such as beans, textured vegetable protein (TVP), or tofu instead of meat in pasta, casseroles, and soups. Meal planning   Eat less salt, if told by your dietitian. To do this: ? Avoid eating processed or premade food. ? Avoid eating fast food.  Eat less animal protein, including cheese, meat, poultry, or fish, if told by your dietitian. To do this: ? Limit the number of times you have meat, poultry, fish, or cheese each week. Eat a diet free of meat at least 2 days a week. ? Eat only one serving each day of meat, poultry, fish, or seafood. ? When you prepare animal protein, cut pieces into small portion sizes. For most meat and fish, one serving is about the size of one deck of cards.  Eat at least 5 servings of fresh fruits and vegetables each day. To do this: ? Keep fruits and vegetables on hand for snacks. ? Eat 1 piece of fruit or a handful of berries with breakfast. ? Have a salad and fruit at lunch. ? Have two kinds of vegetables at dinner.  Limit foods that are high in a substance called oxalate. These include: ? Spinach. ? Rhubarb. ? Beets. ? Potato chips and french fries. ? Nuts.  If you regularly take a diuretic medicine, make sure to eat at least 1-2 fruits or  vegetables high in potassium each day. These include: ? Avocado. ? Banana. ? Orange, prune, carrot, or tomato juice. ? Baked potato. ? Cabbage. ? Beans and split peas. General instructions   Drink enough fluid to keep your urine clear or pale yellow. This is the most important thing you can do.  Talk to your health care provider and dietitian about taking daily supplements. Depending on your health and the cause of your kidney stones, you may be advised: ? Not to take supplements with vitamin C. ? To take a calcium supplement. ? To take a daily probiotic supplement. ? To take other  supplements such as magnesium, fish oil, or vitamin B6.  Take all medicines and supplements as told by your health care provider.  Limit alcohol intake to no more than 1 drink a day for nonpregnant women and 2 drinks a day for men. One drink equals 12 oz of beer, 5 oz of wine, or 1 oz of hard liquor.  Lose weight if told by your health care provider. Work with your dietitian to find strategies and an eating plan that works best for you. What foods are not recommended? Limit your intake of the following foods, or as told by your dietitian. Talk to your dietitian about specific foods you should avoid based on the type of kidney stones and your overall health. Grains Breads. Bagels. Rolls. Baked goods. Salted crackers. Cereal. Pasta. Vegetables Spinach. Rhubarb. Beets. Canned vegetables. Richard Duncan. Olives. Meats and other protein foods Nuts. Nut butters. Large portions of meat, poultry, or fish. Salted or cured meats. Deli meats. Hot dogs. Sausages. Dairy Cheese. Beverages Regular soft drinks. Regular vegetable juice. Seasonings and other foods Seasoning blends with salt. Salad dressings. Canned soups. Soy sauce. Ketchup. Barbecue sauce. Canned pasta sauce. Casseroles. Pizza. Lasagna. Frozen meals. Potato chips. [place] fries. Summary  You can reduce your risk of kidney stones by making changes to your diet.  The most important thing you can do is drink enough fluid. You should drink enough fluid to keep your urine clear or pale yellow.  Ask your health care provider or dietitian how much protein from animal sources you should eat each day, and also how much salt and calcium you should have each day. This information is not intended to replace advice given to you by your health care provider. Make sure you discuss any questions you have with your health care provider. Document Revised: 05/06/2018 Document Reviewed: 12/26/2015 Elsevier Patient Education  2020 [employer].

## 2019-11-16 NOTE — Progress Notes (Signed)
   11/16/2019 8:56 AM   Richard Duncan 11/18/1950 [id number]  Reason for visit: Follow up prostate cancer, BPH s/p HoLEP, nephrolithiasis, ED  HPI: I saw Richard Duncan in urology clinic today for follow-up of the above issues. He is a 69 year old co-morbid male with a pacemaker, morbid obesity, and history of very low risk prostate cancer diagnosed in 2010 with a PSA of 4, with a follow-up biopsy in 2014 that confirmed very low risk disease. His PSA has been stable since that time. He had urodynamics confirmed severe bladder outlet obstruction and elevated PVRs, and he ultimately elected to undergo HOLEP for management of his urinary symptoms. He underwent an uncomplicated HOLEP on 8/34/1962 with removal of a large median lobe, pathology showed 31 g of benign tissue. Initial post HoLEP PSA was down to 2.1.  He was recently seen in the ED in July 2021 for right-sided flank pain, and CT showed a 4 mm stone in the bladder and some mild right-sided hydronephrosis, as well as significant left-sided renal stone burden with a 1.5 cm renal pelvis stone and lower pole stones. Stone burden increased significantly since last cross sectional imaging in 07/2017. I personally reviewed the images.  These appear to be uric acid based on stone density of <500HU, and urine pH of 5.0.  Urine culture at that ED visit showed no growth.  He reports he passed a number of small yellow stones over the course of July, which he brought with him today.  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  He denies any dysuria, flank pain, gross hematuria over the last 2 months.  Regarding his BPH, he reports he is doing very well.  He is urinating with a strong stream and really denies any urinary complaints today.  He has occasional mild leakage that is minimally bothersome, and does not require a pad.  We reviewed the need to continue PSA monitoring with his history of  low risk prostate cancer.  We will draw PSA today.  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  We discussed treatment options including PCNL, ureteroscopy/laser lithotripsy, or dissolution therapy with potassium citrate.  He opts for trial of dissolution therapy.  We discussed the risks and benefits at length.  We also discussed return precautions including uncontrolled flank pain, fevers with flank pain, and symptoms of an infected/obstructed system.  PSA today, call with results Trial of K citrate for uric acid stone dissolution, 64mEq BID Continue sildenafil for ED RTC 3-4 months with CT prior to evaluate change in stone burden   Billey Co, MD  [facility] [address], [place], [place] [address] [phone]

## 2019-11-16 NOTE — Addendum Note (Signed)
Addended by: Donalee Citrin on: 11/16/2019 09:29 AM   Modules accepted: Orders

## 2019-11-17 ENCOUNTER — Telehealth: Payer: Self-pay

## 2019-11-17 NOTE — Telephone Encounter (Signed)
-----   Message from Billey Co, MD sent at 11/17/2019  8:23 AM EDT ----- Good news, PSA nice and low at 1.29, keep follow up as scheduled  Nickolas Madrid, MD 11/17/2019

## 2019-11-17 NOTE — Telephone Encounter (Signed)
See my chart message

## 2019-11-21 ENCOUNTER — Emergency Department: Payer: Medicare HMO

## 2019-11-21 ENCOUNTER — Encounter: Payer: Self-pay | Admitting: Emergency Medicine

## 2019-11-21 ENCOUNTER — Other Ambulatory Visit: Payer: Self-pay

## 2019-11-21 DIAGNOSIS — J441 Chronic obstructive pulmonary disease with (acute) exacerbation: Secondary | ICD-10-CM | POA: Diagnosis not present

## 2019-11-21 DIAGNOSIS — Z8719 Personal history of other diseases of the digestive system: Secondary | ICD-10-CM | POA: Diagnosis not present

## 2019-11-21 DIAGNOSIS — Z9889 Other specified postprocedural states: Secondary | ICD-10-CM | POA: Insufficient documentation

## 2019-11-21 DIAGNOSIS — Z79899 Other long term (current) drug therapy: Secondary | ICD-10-CM | POA: Insufficient documentation

## 2019-11-21 DIAGNOSIS — Z8546 Personal history of malignant neoplasm of prostate: Secondary | ICD-10-CM | POA: Insufficient documentation

## 2019-11-21 DIAGNOSIS — Z96643 Presence of artificial hip joint, bilateral: Secondary | ICD-10-CM | POA: Insufficient documentation

## 2019-11-21 DIAGNOSIS — Z87891 Personal history of nicotine dependence: Secondary | ICD-10-CM | POA: Insufficient documentation

## 2019-11-21 DIAGNOSIS — K5900 Constipation, unspecified: Secondary | ICD-10-CM | POA: Diagnosis present

## 2019-11-21 DIAGNOSIS — K5903 Drug induced constipation: Secondary | ICD-10-CM | POA: Insufficient documentation

## 2019-11-21 DIAGNOSIS — I10 Essential (primary) hypertension: Secondary | ICD-10-CM | POA: Diagnosis not present

## 2019-11-21 DIAGNOSIS — Z86010 Personal history of colonic polyps: Secondary | ICD-10-CM | POA: Diagnosis not present

## 2019-11-21 DIAGNOSIS — Z7951 Long term (current) use of inhaled steroids: Secondary | ICD-10-CM | POA: Insufficient documentation

## 2019-11-21 NOTE — ED Triage Notes (Signed)
Pt to ED from home c/o constipation, last BM 3 days ago which isn't normal for him.  Severe pressure from pelvis to rectum.  States tried stool softeners and miralax at home without relief.  States had colonoscopy in May and has not been right since.  Does use pain medicine at home.  Tried going to bathroom while in lobby, one small hard stool.

## 2019-11-22 ENCOUNTER — Encounter: Payer: Self-pay | Admitting: Radiology

## 2019-11-22 ENCOUNTER — Emergency Department
Admission: EM | Admit: 2019-11-22 | Discharge: 2019-11-22 | Disposition: A | Discharge status: Home / Self Care | Payer: Medicare HMO | Attending: Emergency Medicine | Admitting: Emergency Medicine

## 2019-11-22 ENCOUNTER — Emergency Department: Payer: Medicare HMO

## 2019-11-22 DIAGNOSIS — K5903 Drug induced constipation: Secondary | ICD-10-CM

## 2019-11-22 MED ORDER — MAGNESIUM HYDROXIDE 400 MG/5ML PO SUSP
30.0000 mL | Freq: Once | ORAL | Status: AC
  Administered 2019-11-22: 30 mL via ORAL
  Filled 2019-11-22: qty 30

## 2019-11-22 MED ORDER — LACTULOSE 10 GM/15ML PO SOLN: 30.0000 g | Freq: Every day | ORAL | 0 refills | Status: DC | PRN

## 2019-11-22 MED ORDER — HYDRALAZINE HCL 50 MG PO TABS: 25.0000 mg | ORAL_TABLET | Freq: Once | ORAL | Status: DC

## 2019-11-22 NOTE — ED Provider Notes (Signed)
[facility] Emergency Department Provider Note  ____________________________________________  Time seen: Approximately 1:40 AM  I have reviewed the triage vital signs and the nursing notes.   HISTORY  Chief Complaint Constipation   HPI Richard Duncan is a 69 y.o. male with history of chronic pain on buprenorphine at home who presents for evaluation of constipation.  Patient reports no bowel movement in 3 days.  Has been passing flatus and denies any abdominal distention.  Has had nausea but that is chronic since his colonoscopy 5 months ago.  Denies any prior history of SBO.  Has had prior abdominal surgeries in the past.  Patient reports that this evening he was having severe cramping pain in his rectum and was unable to have a bowel movement.  He took milk of magnesia and reports having a very large bowel movement while in the waiting room.  The pain has mostly subsided at this time.    Past Medical History:  Diagnosis Date  . Anemia   . Anxiety   . Arthritis   . 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 Bilateral carpal tunnel syndrome   . Cancer [facility]) 2011   Prostate Ca  . Complication of anesthesia    urinary retention with Popanol  . COPD (chronic obstructive pulmonary disease) ([place])   . Depression   . Difficult intubation    see anesth note 12/10/16  . Dyspnea    RECENT-SUPPOSED TO HAVE STRESS TEST 09-26-16 WITH DR Ubaldo Glassing BUT CANCELLED DUE TO STENT PLACEMENT ON 09-27-16  . Dysrhythmia   . ED (erectile dysfunction)   . GERD (gastroesophageal reflux disease)    rare  . Hernia, hiatal   . History of blood product transfusion   . History of hiatal hernia   . History of kidney stones   . Hyperlipemia   . Hypertension   . Neck stiffness    s/p surgery  . Neuropathy    legs  . Obesity   . Over weight   . Presence of permanent cardiac pacemaker    Medtronic  . Prostatitis   . PTSD (post-traumatic stress disorder)   . Sleep apnea    OSA--use Bi-PAP  .  Spinal cord stimulator status    Pt reportd non-functional.  "Battery died"  . Urinary retention     Patient Active Problem List   Diagnosis Date Noted  . Personal history of colonic polyps   . Polyp of transverse colon   . Rectal polyp   . COPD with acute exacerbation ([place]) 01/27/2018  . Pain medication agreement signed 04/15/2017  . Spondylosis of lumbar region without myelopathy or radiculopathy 01/20/2017  . Neck pain 01/20/2017  . Numbness and tingling 09/20/2016  . Cervical spondylosis 08/07/2016  . Numbness of upper extremity (Bilateral) 07/30/2016  . Chronic cervical radiculopathy (Bilateral) 07/30/2016  . Carpal tunnel syndrome (Bilateral) 07/30/2016  . Cervical facet syndrome (Bilateral) 07/30/2016  . Chronic shoulder pain (Bilateral) (L>R) 06/18/2016  . Cervicogenic headache (Bilateral) (L>R) 06/18/2016  . Lumbar lateral recess stenosis (Bilateral: L2-3, L3-4, and L4-5) 06/18/2016  . Lumbar facet syndrome (Bilateral) (R>L) 06/18/2016  . 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 Neurogenic pain 06/18/2016  . Neuropathic pain 06/18/2016  . Thoracic facet arthropathy (Bilateral: T7-8, T8-9, and T9-10) 06/18/2016  . DDD (degenerative disc disease), lumbar 06/18/2016  . DDD (degenerative disc disease), cervical 06/18/2016  . DDD (degenerative disc disease), thoracic 06/18/2016  . Neuropathy 04/16/2016  . Elevated C-reactive protein (CRP)  04/10/2016  . Chronic pain syndrome 03/11/2016  . Chronic prescription benzodiazepine use 03/11/2016  . Lower extremity neuropathy (Bilateral) 03/11/2016  . Chronic hip pain (Primary Source of Pain) (Bilateral) (R>L) 03/11/2016  . 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 Chronic low back pain (Secondary source of pain) (Bilateral) (R>L) 03/11/2016  . Chronic neck pain [facility] source of pain) (Midline) (Bilateral) 03/11/2016  . Disturbance of skin sensation 03/11/2016    . Lumbar facet arthropathy (Bilateral: L12, L2-3, L3-4, L4-5, L5-S1) 03/11/2016  . Lumbar foraminal stenosis (Bilateral: L2-3, L3-4, and L4-5) 03/11/2016  . Thoracic foraminal stenosis (Right: T8-9 and T9-10) 03/11/2016  . Lumbar spondylosis 03/11/2016  . Osteoarthritis 03/11/2016  . Thoracic spondylosis 03/11/2016  . Medtronic spinal cord stimulator 03/11/2016  . Acquired heart block 12/07/2015  . 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 Lower urinary tract symptoms 09/13/2014  . Anxiety 06/18/2014  . Cardiac disease 06/18/2014  . Hyperlipidemia, unspecified 06/18/2014  . BP (high blood pressure) 06/18/2014  . Prostate cancer ([place]) 06/18/2014  . ED (erectile dysfunction) 06/18/2014  . Prostatitis 06/18/2014  . Obesity 06/18/2014  . Urinary retention 06/18/2014  . Mechanical complication of prosthetic joint implant ([place]) 07/12/2013  . Failure of recalled total hip arthroplasty hardware ([place]) 07/12/2013    Past Surgical History:  Procedure Laterality Date  . APPENDECTOMY    . [place], 2006   Lumbar Spine  . BACK SURGERY     x2 Laminectomy  . CARDIAC PACEMAKER PLACEMENT    . CATARACT EXTRACTION W/PHACO Right 12/28/2018   Procedure: CATARACT EXTRACTION PHACO AND INTRAOCULAR LENS PLACEMENT (IOC) RIGHT 2.82  00:26.4;  Surgeon: Eulogio Bear, MD;  Location: [place];  Service: Ophthalmology;  Laterality: Right;  . CATARACT EXTRACTION W/PHACO Left 01/25/2019   Procedure: CATARACT EXTRACTION PHACO AND INTRAOCULAR LENS PLACEMENT ([place]) LEFT;  Surgeon: Eulogio Bear, MD;  Location: [place];  Service: Ophthalmology;  Laterality: Left;  CDE 11.49 [place] 0:24.4  . CERVICAL FUSION    . CERVICAL SPINE SURGERY N/A 2008  . COLONOSCOPY    . COLONOSCOPY WITH PROPOFOL N/A 06/25/2019   Procedure: COLONOSCOPY WITH PROPOFOL;  Surgeon: Virgel Manifold, MD;  Location: [facility];  Service: Endoscopy;  Laterality: N/A;  ALLERGIC TO PROPOFOL  PER OFFICE  . CYSTOSCOPY W/ URETERAL STENT PLACEMENT Bilateral 11/01/2016   Procedure: CYSTOSCOPY WITH STENT REPLACEMENT;  Surgeon: [name], MD;  Location: [facility];  Service: Urology;  Laterality: Bilateral;  . CYSTOSCOPY W/ URETERAL STENT PLACEMENT Left 12/10/2016   Procedure: CYSTOSCOPY WITH STENT REPLACEMENT;  Surgeon: Abbie Sons, MD;  Location: [facility];  Service: Urology;  Laterality: Left;  . CYSTOSCOPY W/ URETERAL STENT REMOVAL Bilateral 11/27/2016   Procedure: CYSTOSCOPY WITH STENT REMOVAL/EXCHANGE;  Surgeon: [name], MD;  Location: [facility];  Service: Urology;  Laterality: Bilateral;  . CYSTOSCOPY WITH STENT PLACEMENT Bilateral 09/27/2016   Procedure: CYSTOSCOPY WITH STENT PLACEMENT;  Surgeon: [name], MD;  Location: [facility];  Service: Urology;  Laterality: Bilateral;  . EXCISION OF ABDOMINAL WALL TUMOR N/A 03/08/2016   Procedure: EXCISION OF ABDOMINAL WALL MASS;  Surgeon: Leonie Green, MD;  Location: [facility];  Service: General;  Laterality: N/A;  . HOLEP-LASER ENUCLEATION OF THE PROSTATE WITH MORCELLATION N/A 09/25/2018   Procedure: HOLEP-LASER ENUCLEATION OF THE PROSTATE WITH MORCELLATION;  Surgeon: Billey Co, MD;  Location: [facility];  Service: Urology;  Laterality: N/A;  . JOINT REPLACEMENT  2004, 2005, 2014,  2015   Bilat Hip Replacements  . SPINAL CORD STIMULATOR INSERTION N/A 07/08/2014   Procedure: LUMBAR SPINAL CORD STIMULATOR INSERTION;  Surgeon: Clydell Hakim, MD;  Location: [place];  Service: Neurosurgery;  Laterality: N/A;  LUMBAR SPINAL CORD STIMULATOR INSERTION  . TONSILLECTOMY    . URETEROSCOPY WITH HOLMIUM LASER LITHOTRIPSY Bilateral 11/01/2016   Procedure: URETEROSCOPY WITH HOLMIUM LASER LITHOTRIPSY;  Surgeon: [name], MD;  Location: [facility];  Service: Urology;  Laterality: Bilateral;  . URETEROSCOPY WITH HOLMIUM LASER LITHOTRIPSY Left 12/10/2016   Procedure: URETEROSCOPY WITH HOLMIUM LASER LITHOTRIPSY;   Surgeon: Abbie Sons, MD;  Location: [facility];  Service: Urology;  Laterality: Left;  . URETEROSCOPY WITH HOLMIUM LASER LITHOTRIPSY Left 11/27/2016   Procedure: URETEROSCOPY WITH HOLMIUM LASER LITHOTRIPSY;  Surgeon: [name], MD;  Location: [facility];  Service: Urology;  Laterality: Left;    Prior to Admission medications   Medication Sig Start Date End Date Taking? Authorizing Provider  albuterol (VENTOLIN HFA) 108 (90 Base) MCG/ACT inhaler Inhale into the lungs. 04/09/19 04/08/20  [provider]  amLODipine (NORVASC) 10 MG tablet Take 10 mg by mouth every morning.  06/08/14   [provider]  benazepril (LOTENSIN) 40 MG tablet Take by mouth. 03/22/19   [provider]  Buprenorphine HCl-Naloxone HCl 2-0.5 MG FILM Place under the tongue. 03/29/19   [provider]  cholecalciferol (VITAMIN D3) 25 MCG (1000 UT) tablet Take 1,000 Units by mouth daily.    [provider]  citalopram (CELEXA) 40 MG tablet Take 40 mg by mouth daily.  03/31/17   [provider]  fluorometholone (FML) 0.1 % ophthalmic suspension 1 drop daily. 08/20/19   [provider]  Fluticasone-Umeclidin-Vilant (TRELEGY ELLIPTA) 100-62.5-25 MCG/INH AEPB Inhale 1 puff into the lungs daily.    [provider]  hydrALAZINE (APRESOLINE) 25 MG tablet Take 25 mg by mouth 2 (two) times daily.    [provider]  lactulose (CHRONULAC) 10 GM/15ML solution Take 45 mLs (30 g total) by mouth daily as needed for moderate constipation or severe constipation. 11/22/19   Rudene Re, MD  metoprolol tartrate (LOPRESSOR) 25 MG tablet Take 25 mg by mouth 2 (two) times daily.  08/05/16   [provider]  ondansetron (ZOFRAN) 4 MG tablet Take 1 tablet (4 mg total) by mouth daily as needed for nausea or vomiting. 08/14/19   Harvest Dark, MD  oxymetazoline (AFRIN) 0.05 % nasal spray Place 1 spray into both nostrils 2 (two) times daily as needed for  congestion.    [provider]  pantoprazole (PROTONIX) 40 MG tablet Take 40 mg by mouth daily. 05/08/19   [provider]  Potassium Citrate 15 MEQ (1620 MG) TBCR Take 2 tablets by mouth in the morning and at bedtime. 11/16/19   Billey Co, MD  sildenafil (REVATIO) 20 MG tablet TAKE 2 TO 5 TABLETS BY MOUTH 1/2 TO 1 HOUR PRIOR TO ACTIVITY 10/07/19   Zara Council A, PA-C    Allergies Other, Propofol, and Propranolol  Family History  Problem Relation Age of Onset  . Benign prostatic hyperplasia Father   . Prostate cancer Father   . Hypertension Mother   . Kidney disease Neg Hx   . Kidney cancer Neg Hx   . Bladder Cancer Neg Hx     Social History Social History   Tobacco Use  . Smoking status: Former Smoker    Years: 2.00    Types: Cigarettes    Quit date:  06/18/1966    Years since quitting: 53.4  . Smokeless tobacco: Never Used  . Tobacco comment: quit 40 years ago  Vaping Use  . Vaping Use: Never used  Substance Use Topics  . Alcohol use: Yes    Alcohol/week: 0.0 standard drinks    Comment: Occasional  . Drug use: No    Review of Systems  Constitutional: Negative for fever. Eyes: Negative for visual changes. ENT: Negative for sore throat. Neck: No neck pain  Cardiovascular: Negative for chest pain. Respiratory: Negative for shortness of breath. Gastrointestinal: Negative for abdominal pain, vomiting or diarrhea. + nausea Genitourinary: Negative for dysuria. + rectal pain Musculoskeletal: Negative for back pain. Skin: Negative for rash. Neurological: Negative for headaches, weakness or numbness. Psych: No SI or HI  ____________________________________________   PHYSICAL EXAM:  VITAL SIGNS: ED Triage Vitals  Enc Vitals Group     BP 11/22/19 0117 (!) 173/97     Pulse Rate 11/22/19 0117 95     Resp 11/22/19 0117 20     Temp 11/22/19 0117 98.6 F (37 C)     Temp Source 11/22/19 0117 Oral     SpO2 11/22/19 0117 99 %     Weight  11/21/19 2345 (!) 362 lb (164.2 kg)     Height 11/21/19 2345 6\' 1"  (1.854 m)     Head Circumference --      Peak Flow --      Pain Score 11/21/19 2345 10     Pain Loc --      Pain Edu? --      Excl. in [place]? --     Constitutional: Alert and oriented. Well appearing and in no apparent distress. HEENT:      Head: Normocephalic and atraumatic.         Eyes: Conjunctivae are normal. Sclera is non-icteric.       Mouth/Throat: Mucous membranes are moist.       Neck: Supple with no signs of meningismus. Cardiovascular: Regular rate and rhythm. No murmurs, gallops, or rubs.  Respiratory: Normal respiratory effort. Lungs are clear to auscultation bilaterally.  Gastrointestinal: Soft, non tender, and non distended with positive bowel sounds. No rebound or guarding. Musculoskeletal: No edema, cyanosis, or erythema of extremities. Neurologic: Normal speech and language. Face is symmetric. Moving all extremities. No gross focal neurologic deficits are appreciated. Skin: Skin is warm, dry and intact. No rash noted. Psychiatric: Mood and affect are normal. Speech and behavior are normal.  ____________________________________________   LABS (all labs ordered are listed, but only abnormal results are displayed)  Labs Reviewed - No data to display ____________________________________________  EKG  none  ____________________________________________  RADIOLOGY  I have personally reviewed the images performed during this visit and I agree with the Radiologist's read.   Interpretation by Radiologist:  DG Abdomen 1 View  Result Date: 11/22/2019 CLINICAL DATA:  Constipation EXAM: ABDOMEN - 1 VIEW COMPARISON:  None. FINDINGS: A moderate to large amount of colonic stool is present throughout most notable at the right colon and hepatic flexure. There is a moderate amount of rectal colonic stool present. No dilated loops of bowel are seen throughout the abdomen. Bilateral total hip arthroplasties are  present. Spinal stimulator is noted. No radio-opaque calculi or other significant radiographic abnormality are seen. IMPRESSION: Moderate to large amount colonic stool throughout most notable within the right colon and rectum. No definite evidence of obstruction. Electronically Signed   By: Prudencio Pair M.D.   On: 11/22/2019 00:30  ____________________________________________   PROCEDURES  Procedure(s) performed:none Procedures Critical Care performed:  None ____________________________________________   INITIAL IMPRESSION / ASSESSMENT AND PLAN / ED COURSE  69 y.o. male with history of chronic pain on buprenorphine at home who presents for evaluation of constipation.  Patient was having a lot of rectal pain before coming to the emergency room and took milk of magnesia.  Patient had a large bowel movement in the waiting room.  Reports that his symptoms have mostly resolved at this time.  Abdomen is soft and nondistended with positive bowel sounds and no tenderness.  KUB reviewed by me showing large amount of colonic stool, confirmed by radiology.  Offered an enema but since patient symptoms are significantly improved he requested oral medication.  Will reduce milk of magnesia.  Recommended taking 1 capful of MiraLAX twice daily for the next 3 days.  If that does not resolve patient's constipation recommended feeling prescription of lactulose.  Discussed signs and symptoms of SBO and recommended return to the emergency room if these develop or if patient has abdominal pain or vomiting.  Otherwise follow-up with his GI doctor.  History gathered from patient and his wife.  Old medical records reviewed including results of recent colonoscopy from May 2021 which showed several polyps but no other acute findings.      _____________________________________________ Please note:  Patient was evaluated in Emergency Department today for the symptoms described in the history of present illness. Patient  was evaluated in the context of the global COVID-19 pandemic, which necessitated consideration that the patient might be at risk for infection with the SARS-CoV-2 virus that causes COVID-19. Institutional protocols and algorithms that pertain to the evaluation of patients at risk for COVID-19 are in a state of rapid change based on information released by regulatory bodies including the CDC and federal and state organizations. These policies and algorithms were followed during the patient's care in the ED.  Some ED evaluations and interventions may be delayed as a result of limited staffing during the pandemic.   [place] Controlled Substance Database was reviewed by me. ____________________________________________   FINAL CLINICAL IMPRESSION(S) / ED DIAGNOSES   Final diagnoses:  Drug-induced constipation      NEW MEDICATIONS STARTED DURING THIS VISIT:  ED Discharge Orders         Ordered    lactulose ([place]) 10 GM/15ML solution  Daily PRN        11/22/19 0139           Note:  This document was prepared using Dragon voice recognition software and may include unintentional dictation errors.    Rudene Re, MD 11/22/19 [phone]

## 2019-11-22 NOTE — Discharge Instructions (Addendum)
Constipation: Take colace twice a day everyday. Take senna once a day at bedtime. Take daily probiotics. Drink plenty of fluids and eat a diet rich in fiber. For the next 3 days, take 1 cap of full Miralax in the morning and one in the evening. If that does not work than fill the prescription for lactulose. Follow up with your GI doctor if this continues to be an issue for you.

## 2019-11-24 ENCOUNTER — Other Ambulatory Visit: Payer: Self-pay | Admitting: Urology

## 2019-12-29 ENCOUNTER — Other Ambulatory Visit: Payer: Self-pay | Admitting: Gastroenterology

## 2019-12-29 DIAGNOSIS — R110 Nausea: Secondary | ICD-10-CM

## 2019-12-31 ENCOUNTER — Ambulatory Visit: Payer: Medicare HMO | Attending: Gastroenterology

## 2020-01-05 ENCOUNTER — Other Ambulatory Visit: Payer: Self-pay

## 2020-01-05 ENCOUNTER — Ambulatory Visit
Admission: RE | Admit: 2020-01-05 | Discharge: 2020-01-05 | Disposition: A | Discharge status: Home / Self Care | Payer: Medicare HMO | Source: Ambulatory Visit | Attending: Gastroenterology | Admitting: Gastroenterology

## 2020-01-05 DIAGNOSIS — R110 Nausea: Secondary | ICD-10-CM | POA: Insufficient documentation

## 2020-01-14 ENCOUNTER — Other Ambulatory Visit: Payer: Self-pay | Admitting: Urology

## 2020-01-19 MED ORDER — SILDENAFIL CITRATE 20 MG PO TABS: ORAL_TABLET | ORAL | 0 refills | Status: DC

## 2020-01-19 NOTE — Addendum Note (Signed)
Addended by: Verlene Mayer A on: 01/19/2020 02:30 PM   Modules accepted: Orders

## 2020-01-19 NOTE — Telephone Encounter (Signed)
Patient called regarding PA for sildenafil denied. Advised will send to [facility] and sent text with [facility] coupon.

## 2020-02-28 ENCOUNTER — Ambulatory Visit: Payer: Medicare HMO | Attending: Specialist

## 2020-02-28 DIAGNOSIS — G4733 Obstructive sleep apnea (adult) (pediatric): Secondary | ICD-10-CM | POA: Diagnosis present

## 2020-02-29 ENCOUNTER — Other Ambulatory Visit: Payer: Self-pay

## 2020-03-07 ENCOUNTER — Other Ambulatory Visit: Payer: Self-pay | Admitting: Urology

## 2020-03-13 ENCOUNTER — Ambulatory Visit
Admission: RE | Admit: 2020-03-13 | Discharge: 2020-03-13 | Disposition: A | Discharge status: Home / Self Care | Payer: Medicare HMO | Source: Ambulatory Visit | Attending: Urology | Admitting: Urology

## 2020-03-13 ENCOUNTER — Other Ambulatory Visit: Payer: Self-pay

## 2020-03-13 DIAGNOSIS — R109 Unspecified abdominal pain: Secondary | ICD-10-CM | POA: Insufficient documentation

## 2020-03-14 ENCOUNTER — Encounter: Payer: Self-pay | Admitting: Urology

## 2020-03-14 ENCOUNTER — Ambulatory Visit: Payer: Medicare HMO | Admitting: Urology

## 2020-03-14 VITALS — BP 164/81 | HR 98 | Ht 73.0 in | Wt 366.0 lb

## 2020-03-14 DIAGNOSIS — N529 Male erectile dysfunction, unspecified: Secondary | ICD-10-CM | POA: Diagnosis not present

## 2020-03-14 DIAGNOSIS — N200 Calculus of kidney: Secondary | ICD-10-CM | POA: Diagnosis not present

## 2020-03-14 DIAGNOSIS — N401 Enlarged prostate with lower urinary tract symptoms: Secondary | ICD-10-CM

## 2020-03-14 DIAGNOSIS — C61 Malignant neoplasm of prostate: Secondary | ICD-10-CM | POA: Diagnosis not present

## 2020-03-14 DIAGNOSIS — N138 Other obstructive and reflux uropathy: Secondary | ICD-10-CM

## 2020-03-14 NOTE — Patient Instructions (Signed)

## 2020-03-14 NOTE — Progress Notes (Signed)
   03/14/2020 10:58 AM   TION TSE [birth year] [id number]  Reason for visit: Follow up prostate cancer, BPH status post HOLEP, nephrolithiasis, ED  HPI: I saw [name] in urology clinic for the above issues.  He is a 70 year old male with morbid obesity and BMI of 48, history of very low risk prostate cancer diagnosed in 2010 with a PSA of 4 and follow-up biopsy in 2014 that confirmed very low risk disease, and PSA has been stable since that time.  He had urodynamics that confirmed severe bladder outlet obstruction and elevated PVRs, and he ultimately elected to undergo HOLEP for management of his urinary symptoms. He underwent an uncomplicated HOLEP on 05/30/8880 with removal of a large median lobe, pathology showed 31 g of benign tissue. Initial post HoLEP PSA was down to 2.1.  At our last visit he was having recurrent uric acid stones and CT showed significant bilateral stone burden with no obstruction.  He opted for trial of alkalinization therapy with potassium citrate 30 mEq twice daily.  He has had excellent results and I personally reviewed and interpreted his repeat CT from 03/13/2020 showing almost complete dissolution of his bilateral stones.  We reviewed his images together.  He denies any urinary complaints today and is not having any significant leakage or other urinary problems.  Most recent PSA was stably low at 1.29, will continue yearly PSA with history of very low risk prostate cancer.  Sildenafil continues to work well for his ED. Refilled  RTC 1 year with PSA prior Continue potassium citrate and sildenafil  Billey Co, MD  [place] [address] [place], [place] [address] [phone]

## 2020-09-25 ENCOUNTER — Other Ambulatory Visit: Payer: Self-pay

## 2020-09-25 DIAGNOSIS — N5239 Other post-surgical erectile dysfunction: Secondary | ICD-10-CM

## 2020-09-25 MED ORDER — TADALAFIL 20 MG PO TABS: 20.0000 mg | ORAL_TABLET | Freq: Every day | ORAL | 11 refills | Status: DC | PRN

## 2020-09-25 NOTE — Telephone Encounter (Signed)
If sildenafil '100mg'$  not working can try cialis '20mg'$  prn, thanks  Nickolas Madrid, MD 09/25/2020

## 2020-11-21 ENCOUNTER — Other Ambulatory Visit: Payer: Self-pay | Admitting: Urology

## 2021-03-08 ENCOUNTER — Ambulatory Visit
Admission: RE | Admit: 2021-03-08 | Discharge: 2021-03-08 | Disposition: A | Discharge status: Home / Self Care | Payer: Medicare HMO | Attending: Cardiology | Admitting: Cardiology

## 2021-03-08 ENCOUNTER — Other Ambulatory Visit: Payer: Self-pay

## 2021-03-08 ENCOUNTER — Encounter
Admission: RE | Disposition: A | Discharge status: Home / Self Care | Payer: Self-pay | Source: Home / Self Care | Attending: Cardiology

## 2021-03-08 ENCOUNTER — Encounter: Payer: Self-pay | Admitting: Cardiology

## 2021-03-08 DIAGNOSIS — Z45010 Encounter for checking and testing of cardiac pacemaker pulse generator [battery]: Secondary | ICD-10-CM | POA: Diagnosis not present

## 2021-03-08 DIAGNOSIS — Z01818 Encounter for other preprocedural examination: Secondary | ICD-10-CM

## 2021-03-08 HISTORY — PX: PPM GENERATOR CHANGEOUT: EP1233

## 2021-03-08 SURGERY — PPM GENERATOR CHANGEOUT
Anesthesia: Moderate Sedation

## 2021-03-08 MED ORDER — SODIUM CHLORIDE 0.9 % IV SOLN
80.0000 mg | INTRAVENOUS | Status: DC
  Filled 2021-03-08: qty 2

## 2021-03-08 MED ORDER — CHLORHEXIDINE GLUCONATE CLOTH 2 % EX PADS
6.0000 | MEDICATED_PAD | Freq: Every day | CUTANEOUS | Status: DC
  Administered 2021-03-08: 6 via TOPICAL

## 2021-03-08 MED ORDER — CEFAZOLIN SODIUM-DEXTROSE 2-4 GM/100ML-% IV SOLN: 2.0000 g | INTRAVENOUS | Status: DC

## 2021-03-08 MED ORDER — SODIUM CHLORIDE 0.9 % IV SOLN
INTRAVENOUS | Status: DC | PRN
  Administered 2021-03-08: 80 mg

## 2021-03-08 MED ORDER — LIDOCAINE HCL 1 % IJ SOLN
INTRAMUSCULAR | Status: AC
  Filled 2021-03-08: qty 20

## 2021-03-08 MED ORDER — FENTANYL CITRATE (PF) 100 MCG/2ML IJ SOLN
INTRAMUSCULAR | Status: AC
  Filled 2021-03-08: qty 2

## 2021-03-08 MED ORDER — MIDAZOLAM HCL 2 MG/2ML IJ SOLN
INTRAMUSCULAR | Status: AC
  Filled 2021-03-08: qty 2

## 2021-03-08 MED ORDER — CEFAZOLIN SODIUM-DEXTROSE 1-4 GM/50ML-% IV SOLN
INTRAVENOUS | Status: DC | PRN
  Administered 2021-03-08: 2 g via INTRAVENOUS

## 2021-03-08 MED ORDER — SODIUM CHLORIDE 0.9 % IV SOLN: INTRAVENOUS | Status: DC

## 2021-03-08 MED ORDER — ACETAMINOPHEN 325 MG PO TABS: 325.0000 mg | ORAL_TABLET | ORAL | Status: DC | PRN

## 2021-03-08 MED ORDER — FENTANYL CITRATE (PF) 100 MCG/2ML IJ SOLN
INTRAMUSCULAR | Status: DC | PRN
  Administered 2021-03-08 (×2): 25 ug via INTRAVENOUS

## 2021-03-08 MED ORDER — MIDAZOLAM HCL 2 MG/2ML IJ SOLN
INTRAMUSCULAR | Status: DC | PRN
  Administered 2021-03-08 (×2): .5 mg via INTRAVENOUS

## 2021-03-08 MED ORDER — CEPHALEXIN 500 MG PO CAPS: 500.0000 mg | ORAL_CAPSULE | Freq: Two times a day (BID) | ORAL | 0 refills | Status: AC

## 2021-03-08 MED ORDER — ONDANSETRON HCL 4 MG/2ML IJ SOLN: 4.0000 mg | Freq: Four times a day (QID) | INTRAMUSCULAR | Status: DC | PRN

## 2021-03-08 MED ORDER — LIDOCAINE HCL (PF) 1 % IJ SOLN
INTRAMUSCULAR | Status: DC | PRN
  Administered 2021-03-08: 20 mL

## 2021-03-08 MED ORDER — HEPARIN (PORCINE) IN NACL 1000-0.9 UT/500ML-% IV SOLN
INTRAVENOUS | Status: AC
  Filled 2021-03-08: qty 1000

## 2021-03-08 SURGICAL SUPPLY — 18 items
CABLE SURG 12 DISP A/V CHANNEL (MISCELLANEOUS) ×1 IMPLANT
DEVICE DSSCT PLSMBLD 3.0S LGHT (MISCELLANEOUS) IMPLANT
DRAPE INCISE 23X17 IOBAN STRL (DRAPES) ×1
DRAPE INCISE 23X17 STRL (DRAPES) IMPLANT
DRAPE INCISE IOBAN 23X17 STRL (DRAPES) ×1 IMPLANT
IPG PACE AZUR XT DR MRI W1DR01 (Pacemaker) IMPLANT
PACE AZURE XT DR MRI W1DR01 (Pacemaker) ×2 IMPLANT
PAD ELECT DEFIB RADIOL ZOLL (MISCELLANEOUS) ×1 IMPLANT
PLASMABLADE 3.0S W/LIGHT (MISCELLANEOUS) ×2
POUCH AIGIS-R ANTIBACT PPM (Mesh General) ×2 IMPLANT
POUCH AIGIS-R ANTIBACT PPM MED (Mesh General) IMPLANT
SUT VIC AB 2-0 CT1 27 (SUTURE) ×1
SUT VIC AB 2-0 CT1 TAPERPNT 27 (SUTURE) IMPLANT
SUT VICRYL 4-0  27 PS-2 BARIAT (SUTURE) ×1
SUT VICRYL 4-0 27 PS-2 BARIAT (SUTURE) ×1
SUTURE VICRYL 4-0 27 PS-2 BART (SUTURE) IMPLANT
SYR CONTROL 10ML ANGIOGRAPHIC (SYRINGE) ×1 IMPLANT
TRAY PACEMAKER INSERTION (PACKS) ×2 IMPLANT

## 2021-03-08 NOTE — Discharge Instructions (Signed)
Patient may shower 03/10/2021.  Leave Steri-Strips on.

## 2021-03-20 ENCOUNTER — Encounter: Payer: Self-pay | Admitting: Urology

## 2021-03-20 ENCOUNTER — Other Ambulatory Visit
Admission: RE | Admit: 2021-03-20 | Discharge: 2021-03-20 | Disposition: A | Discharge status: Home / Self Care | Payer: Medicare HMO | Attending: Urology | Admitting: Urology

## 2021-03-20 ENCOUNTER — Other Ambulatory Visit: Payer: Self-pay

## 2021-03-20 ENCOUNTER — Ambulatory Visit: Payer: Medicare HMO | Admitting: Urology

## 2021-03-20 VITALS — BP 105/63 | HR 84 | Ht 73.0 in | Wt 369.0 lb

## 2021-03-20 DIAGNOSIS — N529 Male erectile dysfunction, unspecified: Secondary | ICD-10-CM

## 2021-03-20 DIAGNOSIS — C61 Malignant neoplasm of prostate: Secondary | ICD-10-CM | POA: Diagnosis present

## 2021-03-20 DIAGNOSIS — N401 Enlarged prostate with lower urinary tract symptoms: Secondary | ICD-10-CM | POA: Diagnosis not present

## 2021-03-20 DIAGNOSIS — N138 Other obstructive and reflux uropathy: Secondary | ICD-10-CM

## 2021-03-20 DIAGNOSIS — N200 Calculus of kidney: Secondary | ICD-10-CM | POA: Diagnosis not present

## 2021-03-20 LAB — BLADDER SCAN AMB NON-IMAGING

## 2021-03-20 LAB — PSA: Prostatic Specific Antigen: 1.57 ng/mL (ref 0.00–4.00)

## 2021-03-20 MED ORDER — TADALAFIL 10 MG PO TABS: 10.0000 mg | ORAL_TABLET | Freq: Every day | ORAL | 11 refills | Status: DC | PRN

## 2021-03-20 MED ORDER — POTASSIUM CITRATE ER 15 MEQ (1620 MG) PO TBCR: 2.0000 | EXTENDED_RELEASE_TABLET | Freq: Two times a day (BID) | ORAL | 11 refills | Status: DC

## 2021-03-20 NOTE — Progress Notes (Signed)
° °  03/20/2021 10:36 AM   Richard Duncan 1950-10-08 [id number]  Reason for visit: Follow up low risk prostate cancer, BPH status post HOLEP, nephrolithiasis, ED  HPI: I saw Richard Duncan in urology clinic for the above issues.  He is a 71 year old male with morbid obesity and BMI of 49, history of very low risk prostate cancer diagnosed in 2010 with a PSA of 4 and follow-up biopsy in 2014 that confirmed very low risk disease, and PSA has been stable since that time.  He had urodynamics that confirmed severe bladder outlet obstruction and elevated PVRs, and he ultimately elected to undergo HOLEP for management of his urinary symptoms.  He underwent an uncomplicated HOLEP on 3/87/5643 with removal of a large median lobe, pathology showed 31 g of benign tissue. Most recent PSA was 1.29 in October 2021.  He denies any incontinence and continues to void with a strong stream, PVR is normal at 11 mL today.  He also has a history of recurrent uric acid stones, and has had an excellent response to alkalinization therapy with potassium citrate with significant decrease in stone burden on follow-up CT.  He denies any stone episodes over the last year and would like to continue the potassium citrate.  We also discussed increasing fluid intake, low-salt diet, and avoiding high animal proteins.  We recently changed his sildenafil to Cialis 20 mg on demand for ED.  The Cialis 20 mg was working originally, but has decreased in effectiveness recently.  He attributes this to his ongoing narcotic use which is very possible.  He is interested in trying a daily Cialis to see if this is more helpful.  PSA today, call with results, continue yearly PSA with history of low risk prostate cancer Continue potassium citrate, refilled Trial of Cialis 10 mg daily, okay to take 10 mg boost as needed RTC 1 year symptom check and PVR  Billey Co, MD  [place] [address], [place], [place] [address] [phone]

## 2021-07-25 ENCOUNTER — Other Ambulatory Visit: Payer: Self-pay | Admitting: Nephrology

## 2021-07-25 DIAGNOSIS — R82998 Other abnormal findings in urine: Secondary | ICD-10-CM

## 2021-07-25 DIAGNOSIS — N200 Calculus of kidney: Secondary | ICD-10-CM

## 2021-07-25 DIAGNOSIS — N1832 Chronic kidney disease, stage 3b: Secondary | ICD-10-CM

## 2021-08-02 ENCOUNTER — Ambulatory Visit
Admission: RE | Admit: 2021-08-02 | Discharge: 2021-08-02 | Disposition: A | Discharge status: Home / Self Care | Payer: Medicare HMO | Source: Ambulatory Visit | Attending: Nephrology | Admitting: Nephrology

## 2021-08-02 DIAGNOSIS — N200 Calculus of kidney: Secondary | ICD-10-CM | POA: Insufficient documentation

## 2021-08-02 DIAGNOSIS — R82998 Other abnormal findings in urine: Secondary | ICD-10-CM | POA: Insufficient documentation

## 2021-08-02 DIAGNOSIS — N1832 Chronic kidney disease, stage 3b: Secondary | ICD-10-CM | POA: Insufficient documentation

## 2021-08-22 HISTORY — PX: VITRECTOMY: SHX106

## 2021-10-03 ENCOUNTER — Other Ambulatory Visit: Payer: Self-pay | Admitting: Surgery

## 2021-10-06 ENCOUNTER — Inpatient Hospital Stay: Admission: RE | Admit: 2021-10-06 | Payer: Self-pay | Source: Ambulatory Visit

## 2021-10-06 ENCOUNTER — Ambulatory Visit: Payer: Self-pay

## 2021-10-09 ENCOUNTER — Encounter: Payer: Self-pay | Admitting: Urgent Care

## 2021-10-09 ENCOUNTER — Encounter
Admission: RE | Admit: 2021-10-09 | Discharge: 2021-10-09 | Disposition: A | Discharge status: Home / Self Care | Payer: Medicare HMO | Source: Ambulatory Visit | Attending: Surgery | Admitting: Surgery

## 2021-10-09 VITALS — Ht 72.0 in | Wt 349.0 lb

## 2021-10-09 DIAGNOSIS — Z01812 Encounter for preprocedural laboratory examination: Secondary | ICD-10-CM

## 2021-10-09 DIAGNOSIS — Z79899 Other long term (current) drug therapy: Secondary | ICD-10-CM | POA: Diagnosis not present

## 2021-10-09 HISTORY — DX: Unspecified cataract: H26.9

## 2021-10-09 LAB — POTASSIUM: Potassium: 4.6 mmol/L (ref 3.5–5.1)

## 2021-10-09 NOTE — Patient Instructions (Addendum)
Your procedure is scheduled on: Wednesday, September 20 Report to the Registration Desk on the 1st floor of the [facility]. To find out your arrival time, please call [phone] between 1PM - 3PM on: Tuesday, September 19 If your arrival time is 6:00 am, do not arrive prior to that time as the [facility] entrance doors do not open until 6:00 am.  REMEMBER: Instructions that are not followed completely may result in serious medical risk, up to and including death; or upon the discretion of your surgeon and anesthesiologist your surgery may need to be rescheduled.  Do not eat food after midnight the night before surgery.  No gum chewing, lozengers or hard candies.  You may however, drink CLEAR liquids up to 2 hours before you are scheduled to arrive for your surgery. Do not drink anything within 2 hours of your scheduled arrival time.  Clear liquids include: - water  - apple juice without pulp - gatorade (not RED colors) - black coffee or tea (Do NOT add milk or creamers to the coffee or tea) Do NOT drink anything that is not on this list.  In addition, your doctor has ordered for you to drink the provided  Ensure Pre-Surgery Clear Carbohydrate Drink  Drinking this carbohydrate drink up to two hours before surgery helps to reduce insulin resistance and improve patient outcomes. Please complete drinking 2 hours prior to scheduled arrival time.  TAKE THESE MEDICATIONS THE MORNING OF SURGERY WITH A SIP OF WATER:  Amlodipine Atorvastatin Symbicort inhaler Citalopram Metoprolol Pantoprazole - (take one the night before and one on the morning of surgery - helps to prevent nausea after surgery.)  One week prior to surgery: starting September 13 Stop Anti-inflammatories (NSAIDS) such as Advil, Aleve, Ibuprofen, Motrin, Naproxen, Naprosyn and Aspirin based products such as Excedrin, Goodys Powder, BC Powder. Stop ANY OVER THE COUNTER supplements until after surgery. You may however,  continue to take Tylenol if needed for pain up until the day of surgery.  No Alcohol for 24 hours before or after surgery.  No Smoking including e-cigarettes for 24 hours prior to surgery.  No chewable tobacco products for at least 6 hours prior to surgery.  No nicotine patches on the day of surgery.  Do not use any "recreational" drugs for at least a week prior to your surgery.  Please be advised that the combination of cocaine and anesthesia may have negative outcomes, up to and including death. If you test positive for cocaine, your surgery will be cancelled.  On the morning of surgery brush your teeth with toothpaste and water, you may rinse your mouth with mouthwash if you wish. Do not swallow any toothpaste or mouthwash.  Use CHG Soap as directed on instruction sheet.  Do not wear jewelry, make-up, hairpins, clips or nail polish.  Do not wear lotions, powders, or perfumes.   Do not shave body from the neck down 48 hours prior to surgery just in case you cut yourself which could leave a site for infection.  Also, freshly shaved skin may become irritated if using the CHG soap.  Contact lenses, hearing aids and dentures may not be worn into surgery.  Do not bring valuables to the hospital. [facility] is not responsible for any missing/lost belongings or valuables.   Bring your C-PAP to the hospital with you in case you may have to spend the night.   Notify your doctor if there is any change in your medical condition (cold, fever, infection).  Wear comfortable clothing (specific to your surgery type) to the hospital.  After surgery, you can help prevent lung complications by doing breathing exercises.  Take deep breaths and cough every 1-2 hours. Your doctor may order a device called an Incentive Spirometer to help you take deep breaths.  If you are being discharged the day of surgery, you will not be allowed to drive home. You will need a responsible adult (18 years or  older) to drive you home and stay with you that night.   If you are taking public transportation, you will need to have a responsible adult (18 years or older) with you. Please confirm with your physician that it is acceptable to use public transportation.   Please call the [facility]. at [phone] if you have any questions about these instructions.  Surgery Visitation Policy:  Patients undergoing a surgery or procedure may have two family members or support persons with them as long as the person is not COVID-19 positive or experiencing its symptoms.      Preparing for Surgery with [place] (CHG) Soap  Before surgery, you can play an important role by reducing the number of germs on your skin.  CHG (Chlorhexidine gluconate) soap is an antiseptic cleanser which kills germs and bonds with the skin to continue killing germs even after washing.  Please do not use if you have an allergy to CHG or antibacterial soaps. If your skin becomes reddened/irritated stop using the CHG.  1. Shower the NIGHT BEFORE SURGERY and the MORNING OF SURGERY with CHG soap.  2. If you choose to wash your hair, wash your hair first as usual with your normal shampoo.  3. After shampooing, rinse your hair and body thoroughly to remove the shampoo.  4. Use CHG as you would any other liquid soap. You can apply CHG directly to the skin and wash gently with a scrungie or a clean washcloth.  5. Apply the CHG soap to your body only from the neck down. Do not use on open wounds or open sores. Avoid contact with your eyes, ears, mouth, and genitals (private parts). Wash face and genitals (private parts) with your normal soap.  6. Wash thoroughly, paying special attention to the area where your surgery will be performed.  7. Thoroughly rinse your body with warm water.  8. Do not shower/wash with your normal soap after using and rinsing off the CHG soap.  9. Pat yourself dry with a  clean towel.  10. Wear clean pajamas to bed the night before surgery.  12. Place clean sheets on your bed the night of your first shower and do not sleep with pets.  13. Shower again with the CHG soap on the day of surgery prior to arriving at the hospital.  14. Do not apply any deodorants/lotions/powders.  15. Please wear clean clothes to the hospital.

## 2021-10-12 ENCOUNTER — Encounter: Payer: Self-pay | Admitting: Surgery

## 2021-10-12 NOTE — Progress Notes (Signed)
Perioperative Services  Pre-Admission/Anesthesia Testing Clinical Review  Date: 10/15/21  Patient Demographics:  Name: Richard Duncan DOB:   [birth year] MRN:   [id number]  Planned Surgical Procedure(s):    Case: [id number] Date/Time: 10/17/21 1122   Procedures:      CARPAL TUNNEL RELEASE ENDOSCOPIC (Left: Wrist)     RELEASE TRIGGER FINGER/A-1 PULLEY (Left: Thumb)   Anesthesia type: Choice   Pre-op diagnosis:      Carpal tunnel syndrome, left G56.02     Trigger finger of left thumb M65.312   Location: [facility] OR ROOM 02 / [facility]   Surgeons: Corky Mull, MD   NOTE: Available PAT nursing documentation and vital signs have been reviewed. Clinical nursing staff has updated patient's PMH/PSHx, current medication list, and drug allergies/intolerances to ensure comprehensive history available to assist in medical decision making as it pertains to the aforementioned surgical procedure and anticipated anesthetic course. Extensive review of available clinical information performed. Richard Duncan PMH and PSHx updated with any diagnoses/procedures that  may have been inadvertently omitted during his intake with the pre-admission testing department's nursing staff.  Clinical Discussion:  Richard Duncan is a 71 y.o. male who is submitted for pre-surgical anesthesia review and clearance prior to him undergoing the above procedure. Patient is a Former Smoker (quit 05/1966). Pertinent PMH includes: CAD, complete heart block (s/p PPM placement), diastolic dysfunction, aortic atherosclerosis, HTN, HLD, dyspnea, COPD, OSAH (requires nocturnal PAP therapy), GERD (on daily PPI), hiatal hernia, anemia, ED (on PDE5i), OA, cervical DDD (s/p ACDF C5-C6), thoracolumbar spondylosis (s/p laminectomy x 2), lower extremity neuropathy, PTSD, anxiety, depression, chronic pain syndrome (on buprenorphine/naloxone).  Patient is followed by cardiology Nehemiah Massed, MD). He was last seen in the cardiology  clinic on 10/09/2021; notes reviewed.  At the time of his clinic visit, patient denied any episodes of chest pain, however he continues to experience shortness of breath.  He denied any PND, orthopnea, palpitations, significant peripheral edema, vertiginous symptoms, or presyncope/syncope.  Patient with a past medical history significant for cardiovascular diagnoses.  Patient with a history of acquired high-grade (complete) heart block.  He underwent placement of a Medtronic biventricular pacemaker on 06/28/2011.  Pulse generator was changed on 03/08/2021.  Device is regularly interrogated by his primary cardiology team.  There were 40% stenoses noted when in the OM1, LAD, and LCx.  Minor luminal irregularities noted in the mid RCA.  Given the nonobstructive nature of his coronary artery disease, the decision was made to defer further intervention, opting for medical management.  Diagnostic LEFT heart catheterization performed on 09/13/2011 revealing a normal left ventricular systolic function with an EF of 60%.  There were 40% stenoses noted within the OM1, LAD, and LCx, in addition to minor luminal irregularities noted within mid RCA.  Intervention was deferred opting for medical management.  Myocardial perfusion imaging study performed on 10/14/2016 revealing a mildly reduced left ventricular systolic function with an EF of 45-55%.  There was no evidence of stress-induced myocardial ischemia or arrhythmia; no scintigraphic evidence of scar.  Study determined to be normal and low risk.  Last TTE was performed on 02/08/2020 revealing a normal left ventricular systolic function with mild LVH; LVEF >55%.  There was trivial tricuspid and mitral valve regurgitation.  There was no evidence of a significant transvalvular gradient to suggest stenosis.  Blood pressure well controlled at 122/80 on currently prescribed CCB (amlodipine), ACEi (benazepril), diuretic (chlorthalidone), and beta-blocker (metoprolol  tartrate) therapies.  Patient is on  atorvastatin for his HLD diagnosis and further ASCVD prevention.  Of note, in the setting of known cardiovascular disease, it is important to note that patient is on a PDE5i (tadalafil for erectile dysfunction diagnosis.  Discussed adding an SGLT2i, however patient wished to defer treatment at this time.  Patient is not diabetic.  He does have an OSAH diagnosis and is reported to be compliant with prescribed nocturnal PAP therapy.  Patient with multiple medical comorbidities and arthritides affecting his functional capacity.  Per the DASI, patient unable to achieve 4 METS of activity without experiencing angina/anginal equivalent symptoms.  No changes were made to his medication regimen.  Patient follow-up with outpatient cardiology in 6 months or sooner if needed.  Richard Duncan is scheduled for an elective LEFT ENDOSCOPIC CARPAL TUNNEL RELEASE ENDOSCOPIC; RELEASE LEFT TRIGGER FINGER/A-1 PULLEY on 10/17/2021 with Dr. Milagros Evener, MD.  Given patient's past medical history significant for cardiovascular diagnoses, presurgical cardiac clearance was sought by the PAT team. Per cardiology, "the patient is at the lowest risk possible for perioperative cardiovascular complications with the planned procedure.  The overall risk his procedure is low (<1%).  Currently has no evidence active and/or significant angina and/or congestive heart failure. Patient may proceed to surgery without restriction or need for further cardiovascular testing at an overall LOW risk".  In review of his medication reconciliation, the patient is not noted to be taking any type of anticoagulation or antiplatelet therapies that would need to be held during his perioperative course.  Patient reports previous perioperative complications with anesthesia in the past.  Patient has experienced (+) urinary retention with the use of both propranolol and propofol.  He reports that he has required urinary  catheterization in the past with the use of these agents.  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 In review of the available records, it is noted that patient underwent a MAC anesthetic course at [facility] (ASA IV) in 07/2021 without documented complications.      10/09/2021    8:48 AM 03/20/2021    9:18 AM 03/08/2021   11:10 AM  Vitals with BMI  Height _0  _1    Weight 349 lbs 369 lbs   BMI 73.53 29.92   Systolic  426 834  Diastolic  63 79  Pulse  84 59    Providers/Specialists:   NOTE: Primary physician provider listed below. Patient may have been seen by APP or partner within same practice.   PROVIDER ROLE / SPECIALTY LAST OV  Poggi, Marshall Cork, MD Orthopedics (Surgeon) 09/19/2021  Sofie Hartigan, MD Primary Care Provider 05/31/2021  Serafina Royals, MD Cardiology 10/09/2021  Lyla Son, MD Nephrology 09/04/2021  Felton Clinton, MD Pain Management 08/08/2021   Allergies:  Other, Propofol, and Propranolol  Current Home Medications:   No current facility-administered medications for this encounter.    amLODipine (NORVASC) 10 MG tablet   atorvastatin (LIPITOR) 20 MG tablet   benazepril (LOTENSIN) 40 MG tablet   budesonide-formoterol (SYMBICORT) 160-4.5 MCG/ACT inhaler   Buprenorphine HCl-Naloxone HCl 2-0.5 MG FILM   chlorthalidone (HYGROTON) 25 MG tablet   Cholecalciferol (VITAMIN D3 PO)   citalopram (CELEXA) 40 MG tablet   methocarbamol (ROBAXIN) 500 MG tablet   metoprolol tartrate (LOPRESSOR) 50 MG tablet   oxymetazoline (AFRIN) 0.05 % nasal spray   pantoprazole (PROTONIX) 40 MG tablet   Potassium Citrate 15 MEQ (1620  MG) TBCR   tadalafil (CIALIS) 10 MG tablet   History:   Past Medical History:  Diagnosis Date   Anemia    Anxiety    Aortic atherosclerosis ([place])    Arthritis    Arthropathia 11/08/2013    Bilateral carpal tunnel syndrome    CAD (coronary artery disease) 09/13/2011   a.) LHC 09/13/2014: EF 60%. 40% OM1, 40% LAD, 40% LCx, mRCA with minor luminal irregs - med mgmt.   Chronic pain syndrome    a.) followed by pain management; on buprenorphine/naloxone   CKD (chronic kidney disease), stage III ([place])    Complete heart block ([place]) 05/2011   a.) s/p PPM placement 23/76/2831   Complication of anesthesia    a.) urinary retention with PROPRANOLOL and PROPOFOL; has required urinary catheterization in the past   COPD (chronic obstructive pulmonary disease) ([place])    DDD (degenerative disc disease), cervical    a.) s/p ACDF C5-C6; resulting/residual postoperative neck "stiffness"   Depression    Diastolic dysfunction 51/76/1607   a.) TTE 10/10/2016: EF >55%, triv MR/TR/PR, G1DD; b.) TTE 02/08/2020: EF >55%, mild LVH, triv MR/TR   Difficult intubation    a.) secondary to macroglossia + reduced cervical mobility (s/p cervical fusion)   Dyspnea    Dysrhythmia    ED (erectile dysfunction)    a.) on PDE5i (tadalafil)   GERD (gastroesophageal reflux disease)    Hernia, hiatal    History of bilateral cataract extraction 2020   History of blood product transfusion    History of hiatal hernia    History of kidney stones    Hyperlipemia    Hypertension    Neuropathy involving both lower extremities    Obesity    OSA on CPAP    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   a.) Medtronic device placed 06/28/2011; b.) generator changed 03/08/2021   Prostate cancer ([place]) 2011   Prostatitis    PTSD (post-traumatic stress disorder)    Spinal cord stimulator status    a.) non-functional; "battery died"   Spondylolysis of thoracolumbar region    a.) s/p lumbar laminectomy x 2   Past Surgical History:  Procedure Laterality Date   ANTERIOR CERVICAL DECOMP/DISCECTOMY FUSION N/A 2008   Procedure: ANTERIOR CERVICAL DECOMP/DISCECTOMY FUSION (C5-C6)   APPENDECTOMY     CARDIAC  PACEMAKER PLACEMENT  06/28/2011   Procedure: CARDIAC PACEMAKER PLACEMENT; Location: [facility]; Surgeon: Neoma Laming, MD   CATARACT EXTRACTION W/PHACO Right 12/28/2018   Procedure: CATARACT EXTRACTION PHACO AND INTRAOCULAR LENS PLACEMENT ([place]) RIGHT 2.82  00:26.4;  Surgeon: Eulogio Bear, MD;  Location: [place];  Service: Ophthalmology;  Laterality: Right;   CATARACT EXTRACTION W/PHACO Left 01/25/2019   Procedure: CATARACT EXTRACTION PHACO AND INTRAOCULAR LENS PLACEMENT ([place]) LEFT;  Surgeon: Eulogio Bear, MD;  Location: [place];  Service: Ophthalmology;  Laterality: Left;  CDE 11.49 [place] 0:24.4   COLONOSCOPY     2006, 2015, 2021   COLONOSCOPY WITH PROPOFOL N/A 06/25/2019   Procedure: COLONOSCOPY WITH PROPOFOL;  Surgeon: Virgel Manifold, MD;  Location: [facility];  Service: Endoscopy;  Laterality: N/A;  ALLERGIC TO PROPOFOL PER OFFICE   CYSTOSCOPY W/ URETERAL STENT PLACEMENT Bilateral 11/01/2016   Procedure: CYSTOSCOPY WITH STENT REPLACEMENT;  Surgeon: [name], MD;  Location: [facility];  Service: Urology;  Laterality: Bilateral;   CYSTOSCOPY W/ URETERAL STENT PLACEMENT Left 12/10/2016   Procedure: CYSTOSCOPY WITH STENT REPLACEMENT;  Surgeon: Abbie Sons, MD;  Location: [facility];  Service: Urology;  Laterality: Left;   CYSTOSCOPY W/ URETERAL STENT REMOVAL Bilateral 11/27/2016   Procedure: CYSTOSCOPY WITH STENT REMOVAL/EXCHANGE;  Surgeon: [name], MD;  Location: [facility];  Service: Urology;  Laterality: Bilateral;   CYSTOSCOPY WITH STENT PLACEMENT Bilateral 09/27/2016   Procedure: CYSTOSCOPY WITH STENT PLACEMENT;  Surgeon: [name], MD;  Location: [facility];  Service: Urology;  Laterality: Bilateral;   EXCISION OF ABDOMINAL WALL TUMOR N/A 03/08/2016   Procedure: EXCISION OF ABDOMINAL WALL MASS;  Surgeon: Leonie Green, MD;  Location: [facility];  Service: General;  Laterality: N/A;   HOLEP-LASER ENUCLEATION OF THE  PROSTATE WITH MORCELLATION N/A 09/25/2018   Procedure: HOLEP-LASER ENUCLEATION OF THE PROSTATE WITH MORCELLATION;  Surgeon: Billey Co, MD;  Location: [facility];  Service: Urology;  Laterality: N/A;   JOINT REPLACEMENT  2004, 2005, 2014, 2015   Bilat Hip Replacements   LEFT HEART CATH AND CORONARY ANGIOGRAPHY Left 09/13/2011   Procedure: LEFT HEART CATH AND CORONARY ANGIOGRAPHY; Location: [place]; Surgeon: Neoma Laming, MD   [place]  2006   PPM GENERATOR CHANGEOUT N/A 03/08/2021   Procedure: PPM GENERATOR CHANGEOUT;  Surgeon: Isaias Cowman, MD;  Location: [facility];  Service: Cardiovascular;  Laterality: N/A;   SPINAL CORD STIMULATOR INSERTION N/A 07/08/2014   Procedure: LUMBAR SPINAL CORD STIMULATOR INSERTION;  Surgeon: Clydell Hakim, MD;  Location: [facility];  Service: Neurosurgery;  Laterality: N/A;  LUMBAR SPINAL CORD STIMULATOR INSERTION   TONSILLECTOMY     URETEROSCOPY WITH HOLMIUM LASER LITHOTRIPSY Bilateral 11/01/2016   Procedure: URETEROSCOPY WITH HOLMIUM LASER LITHOTRIPSY;  Surgeon: [name], MD;  Location: [facility];  Service: Urology;  Laterality: Bilateral;   URETEROSCOPY WITH HOLMIUM LASER LITHOTRIPSY Left 12/10/2016   Procedure: URETEROSCOPY WITH HOLMIUM LASER LITHOTRIPSY;  Surgeon: Abbie Sons, MD;  Location: [facility];  Service: Urology;  Laterality: Left;   URETEROSCOPY WITH HOLMIUM LASER LITHOTRIPSY Left 11/27/2016   Procedure: URETEROSCOPY WITH HOLMIUM LASER LITHOTRIPSY;  Surgeon: [name], MD;  Location: [facility];  Service: Urology;  Laterality: Left;   VITRECTOMY Left 08/22/2021   Family History  Problem Relation Age of Onset   Benign prostatic hyperplasia Father    Prostate cancer Father    Hypertension Mother    Kidney disease Neg Hx    Kidney cancer Neg Hx    Bladder Cancer Neg Hx    Social History   Tobacco Use   Smoking status: Former    Years: 2.00    Types: Cigarettes    Quit  date: 06/18/1966    Years since quitting: 55.3    Passive exposure: Never   Smokeless tobacco: Never   Tobacco comments:    quit 40 years ago  Vaping Use   Vaping Use: Never used  Substance Use Topics   Alcohol use: Yes    Comment: rarely   Drug use: No    Pertinent Clinical Results:  LABS: Labs reviewed: Acceptable for surgery.   Ref Range & Units 07/25/2021  WBC 3.8 - 10.8 Thousand/uL 14.2 High    RBC 4.20 - 5.80 Million/uL 5.10   Hemoglobin 13.2 - 17.1 g/dL 16.3   Hematocrit 38.5 - 50.0 % 46.9   MCV 80.0 - 100.0 fL 92.0   MCH 27.0 - 33.0 pg 32.0   MCHC 32.0 - 36.0 g/dL 34.8   RDW 11.0 - 15.0 % 12.6   Platelets 140 - 400 Thousand/uL 230   MPV 7.5 - 12.5 fL 10.8  Neutrophils Absolute 1500 - 7800 cells/uL 10,977 High    Lymphocytes Absolute 850 - 3900 cells/uL 1,945   Monocytes Absolute 200 - 950 cells/uL 866   Eosinophils Absolute 15 - 500 cells/uL 355   Basophils Absolute 0 - 200 cells/uL 57   Neutrophils Relative % 77.3   Lymphocytes % 13.7   Monocytes % 6.1   Eosinophils % 2.5   Basophils Relative % 0.4   Resulting Agency  [facility]  Specimen Collected: 07/25/21 15:08   Performed by: Yvetta Coder Last Resulted: 07/26/21 10:08  Received From: [facility]  Result Received: 07/28/21 10:01    Ref Range & Units 07/25/2021  Glucose 65 - 99 mg/dL 99   BUN 7 - 25 mg/dL 33 High    Creatinine 0.70 - 1.35 mg/dL 1.55 High    eGFR CKD-EPI CR 2021 > OR = 60 mL/min/1.7m 48 Low    BUN/Creatinine Ratio 6 - 22 (calc) 21   Sodium 135 - 146 mmol/L 143   Potassium 3.5 - 5.3 mmol/L 4.9   Chloride 98 - 110 mmol/L 105   Bicarbonate (CO2) 20 - 32 mmol/L 31   Calcium 8.6 - 10.3 mg/dL 9.5   Phosphorus 2.1 - 4.3 mg/dL 3.0   Albumin 3.6 - 5.1 g/dL 4.2   Resulting Agency  [facility]  Specimen Collected: 07/25/21 15:08   Performed by: QYvetta CoderLast Resulted: 07/26/21 10:08  [facility]  Result Received: 07/28/21 10:01    ECG: Date:  03/08/2021 Time ECG obtained: 1110 AM Rate: 60 bpm Rhythm:  Atrial ventricular dual paced rhythm Axis (leads I and aVF): Normal Intervals: PR 151 ms. QRS 179 ms. QTc 499 ms. ST segment and T wave changes: No evidence of acute ST segment elevation or depression Comparison: Similar to previous tracing obtained on 09/16/2018    IMAGING / PROCEDURES: TRANSTHORACIC ECHOCARDIOGRAM performed on 02/08/2020 Normal left ventricular systolic function with an EF of >55%  Mild LVH Normal left ventricular diastolic Doppler parameters Normal right ventricular systolic function Trivial MR and TR No AR or PR Normal transvalvular gradients; no valvular stenosis No pericardial effusion  MYOCARDIAL PERFUSION IMAGING STUDY ([facility]) performed on 10/14/2016 Mildly decreased left ventricular systolic function with a LV EF of 45-55% Normal myocardial thickening and wall motion Left ventricular cavity size normal SPECT images demonstrate homogenous tracer distribution throughout the myocardium No evidence of stress-induced myocardial ischemia or arrhythmia Normal low risk study  Impression and Plan:  Richard OBOYLEhas been referred for pre-anesthesia review and clearance prior to him undergoing the planned anesthetic and procedural courses. Available labs, pertinent testing, and imaging results were personally reviewed by me. This patient has been appropriately cleared by cardiology with an overall LOW risk of significant perioperative cardiovascular complications.  Based on clinical review performed today (10/15/21), barring any significant acute changes in the patient's overall condition, it is anticipated that he will be able to proceed with the planned surgical intervention. Any acute changes in clinical condition may necessitate his procedure being postponed and/or cancelled. Patient will meet with anesthesia team (MD and/or CRNA) on the day of his procedure for preoperative evaluation/assessment.  Questions regarding anesthetic course will be fielded at that time.   Pre-surgical instructions were reviewed with the patient during his PAT appointment and questions were fielded by PAT clinical staff. Patient was advised that if any questions or concerns arise prior to his procedure then he should return a call to PAT and/or his surgeon's office to discuss.  BGaspar Bidding  Pearline Cables, MSN, APRN, FNP-C, [place]  Peri-operative Services Nurse Practitioner Phone: [phone] Fax: [phone] 10/15/21 11:49 AM  NOTE: This note has been prepared using Dragon dictation software. Despite my best ability to proofread, there is always the potential that unintentional transcriptional errors may still occur from this process.

## 2021-10-15 ENCOUNTER — Encounter: Payer: Self-pay | Admitting: Surgery

## 2021-10-17 ENCOUNTER — Encounter
Admission: RE | Disposition: A | Discharge status: Home / Self Care | Payer: Self-pay | Source: Ambulatory Visit | Attending: Surgery

## 2021-10-17 ENCOUNTER — Ambulatory Visit: Payer: Medicare HMO | Admitting: Urgent Care

## 2021-10-17 ENCOUNTER — Other Ambulatory Visit: Payer: Self-pay

## 2021-10-17 ENCOUNTER — Ambulatory Visit
Admission: RE | Admit: 2021-10-17 | Discharge: 2021-10-17 | Disposition: A | Discharge status: Home / Self Care | Payer: Medicare HMO | Source: Ambulatory Visit | Attending: Surgery | Admitting: Surgery

## 2021-10-17 ENCOUNTER — Encounter: Payer: Self-pay | Admitting: Surgery

## 2021-10-17 DIAGNOSIS — Z950 Presence of cardiac pacemaker: Secondary | ICD-10-CM | POA: Insufficient documentation

## 2021-10-17 DIAGNOSIS — K219 Gastro-esophageal reflux disease without esophagitis: Secondary | ICD-10-CM | POA: Insufficient documentation

## 2021-10-17 DIAGNOSIS — G894 Chronic pain syndrome: Secondary | ICD-10-CM | POA: Diagnosis not present

## 2021-10-17 DIAGNOSIS — M65312 Trigger thumb, left thumb: Secondary | ICD-10-CM | POA: Insufficient documentation

## 2021-10-17 DIAGNOSIS — Z79899 Other long term (current) drug therapy: Secondary | ICD-10-CM | POA: Diagnosis not present

## 2021-10-17 DIAGNOSIS — Z981 Arthrodesis status: Secondary | ICD-10-CM | POA: Diagnosis not present

## 2021-10-17 DIAGNOSIS — G4730 Sleep apnea, unspecified: Secondary | ICD-10-CM | POA: Insufficient documentation

## 2021-10-17 DIAGNOSIS — G709 Myoneural disorder, unspecified: Secondary | ICD-10-CM | POA: Insufficient documentation

## 2021-10-17 DIAGNOSIS — M1990 Unspecified osteoarthritis, unspecified site: Secondary | ICD-10-CM | POA: Diagnosis not present

## 2021-10-17 DIAGNOSIS — I442 Atrioventricular block, complete: Secondary | ICD-10-CM | POA: Diagnosis not present

## 2021-10-17 DIAGNOSIS — Z7951 Long term (current) use of inhaled steroids: Secondary | ICD-10-CM | POA: Diagnosis not present

## 2021-10-17 DIAGNOSIS — I10 Essential (primary) hypertension: Secondary | ICD-10-CM | POA: Diagnosis not present

## 2021-10-17 DIAGNOSIS — J449 Chronic obstructive pulmonary disease, unspecified: Secondary | ICD-10-CM | POA: Insufficient documentation

## 2021-10-17 DIAGNOSIS — Z87891 Personal history of nicotine dependence: Secondary | ICD-10-CM | POA: Diagnosis not present

## 2021-10-17 DIAGNOSIS — Z6841 Body Mass Index (BMI) 40.0 and over, adult: Secondary | ICD-10-CM | POA: Insufficient documentation

## 2021-10-17 DIAGNOSIS — F418 Other specified anxiety disorders: Secondary | ICD-10-CM | POA: Insufficient documentation

## 2021-10-17 DIAGNOSIS — G5602 Carpal tunnel syndrome, left upper limb: Secondary | ICD-10-CM | POA: Insufficient documentation

## 2021-10-17 HISTORY — PX: CARPAL TUNNEL RELEASE: SHX101

## 2021-10-17 HISTORY — DX: Obstructive sleep apnea (adult) (pediatric): G47.33

## 2021-10-17 HISTORY — DX: Other cervical disc degeneration, unspecified cervical region: M50.30

## 2021-10-17 HISTORY — DX: Spondylolysis, thoracolumbar region: M43.05

## 2021-10-17 HISTORY — PX: TRIGGER FINGER RELEASE: SHX641

## 2021-10-17 HISTORY — DX: Atherosclerosis of aorta: I70.0

## 2021-10-17 HISTORY — DX: Chronic kidney disease, stage 3 unspecified: N18.30

## 2021-10-17 HISTORY — DX: Unspecified mononeuropathy of bilateral lower limbs: G57.93

## 2021-10-17 HISTORY — DX: Chronic pain syndrome: G89.4

## 2021-10-17 SURGERY — RELEASE, CARPAL TUNNEL, ENDOSCOPIC
Anesthesia: General | Site: Wrist | Laterality: Left

## 2021-10-17 MED ORDER — FENTANYL CITRATE (PF) 100 MCG/2ML IJ SOLN
INTRAMUSCULAR | Status: DC | PRN
  Administered 2021-10-17: 50 ug via INTRAVENOUS

## 2021-10-17 MED ORDER — MIDAZOLAM HCL 2 MG/2ML IJ SOLN
INTRAMUSCULAR | Status: DC | PRN
  Administered 2021-10-17: 2 mg via INTRAVENOUS

## 2021-10-17 MED ORDER — CHLORHEXIDINE GLUCONATE 0.12 % MT SOLN
OROMUCOSAL | Status: AC
  Administered 2021-10-17: 15 mL via OROMUCOSAL
  Filled 2021-10-17: qty 15

## 2021-10-17 MED ORDER — OXYCODONE HCL 5 MG PO TABS: 5.0000 mg | ORAL_TABLET | Freq: Once | ORAL | Status: DC | PRN

## 2021-10-17 MED ORDER — LACTATED RINGERS IV SOLN: INTRAVENOUS | Status: DC

## 2021-10-17 MED ORDER — SODIUM CHLORIDE 0.9 % IV SOLN: INTRAVENOUS | Status: DC

## 2021-10-17 MED ORDER — PROMETHAZINE HCL 25 MG/ML IJ SOLN: 6.2500 mg | INTRAMUSCULAR | Status: DC | PRN

## 2021-10-17 MED ORDER — PROPOFOL 10 MG/ML IV BOLUS
INTRAVENOUS | Status: AC
  Filled 2021-10-17: qty 20

## 2021-10-17 MED ORDER — ORAL CARE MOUTH RINSE: 15.0000 mL | Freq: Once | OROMUCOSAL | Status: AC

## 2021-10-17 MED ORDER — DEXMEDETOMIDINE HCL IN NACL 200 MCG/50ML IV SOLN
INTRAVENOUS | Status: DC | PRN
  Administered 2021-10-17: 12 ug via INTRAVENOUS
  Administered 2021-10-17: 8 ug via INTRAVENOUS

## 2021-10-17 MED ORDER — ACETAMINOPHEN 10 MG/ML IV SOLN: 1000.0000 mg | Freq: Once | INTRAVENOUS | Status: DC | PRN

## 2021-10-17 MED ORDER — ONDANSETRON HCL 4 MG/2ML IJ SOLN
INTRAMUSCULAR | Status: DC | PRN
  Administered 2021-10-17: 4 mg via INTRAVENOUS

## 2021-10-17 MED ORDER — LIDOCAINE HCL (CARDIAC) PF 100 MG/5ML IV SOSY
PREFILLED_SYRINGE | INTRAVENOUS | Status: DC | PRN
  Administered 2021-10-17: 100 mg via INTRAVENOUS

## 2021-10-17 MED ORDER — KETAMINE HCL 50 MG/5ML IJ SOSY
PREFILLED_SYRINGE | INTRAMUSCULAR | Status: AC
  Filled 2021-10-17: qty 5

## 2021-10-17 MED ORDER — DROPERIDOL 2.5 MG/ML IJ SOLN: 0.6250 mg | Freq: Once | INTRAMUSCULAR | Status: DC | PRN

## 2021-10-17 MED ORDER — BUPIVACAINE HCL (PF) 0.5 % IJ SOLN
INTRAMUSCULAR | Status: DC | PRN
  Administered 2021-10-17: 15 mL

## 2021-10-17 MED ORDER — FENTANYL CITRATE (PF) 100 MCG/2ML IJ SOLN
INTRAMUSCULAR | Status: AC
  Filled 2021-10-17: qty 2

## 2021-10-17 MED ORDER — MIDAZOLAM HCL 2 MG/2ML IJ SOLN
INTRAMUSCULAR | Status: AC
  Filled 2021-10-17: qty 2

## 2021-10-17 MED ORDER — CHLORHEXIDINE GLUCONATE 0.12 % MT SOLN: 15.0000 mL | Freq: Once | OROMUCOSAL | Status: AC

## 2021-10-17 MED ORDER — PROPOFOL 10 MG/ML IV BOLUS
INTRAVENOUS | Status: DC | PRN
  Administered 2021-10-17: 20 mg via INTRAVENOUS
  Administered 2021-10-17: 200 mg via INTRAVENOUS
  Administered 2021-10-17: 50 mg via INTRAVENOUS

## 2021-10-17 MED ORDER — 0.9 % SODIUM CHLORIDE (POUR BTL) OPTIME
TOPICAL | Status: DC | PRN
  Administered 2021-10-17: 500 mL

## 2021-10-17 MED ORDER — OXYCODONE HCL 5 MG PO TABS: 5.0000 mg | ORAL_TABLET | ORAL | Status: DC | PRN

## 2021-10-17 MED ORDER — METOCLOPRAMIDE HCL 10 MG PO TABS: 5.0000 mg | ORAL_TABLET | Freq: Three times a day (TID) | ORAL | Status: DC | PRN

## 2021-10-17 MED ORDER — KETAMINE HCL 10 MG/ML IJ SOLN
INTRAMUSCULAR | Status: DC | PRN
  Administered 2021-10-17 (×2): 20 mg via INTRAVENOUS

## 2021-10-17 MED ORDER — FENTANYL CITRATE (PF) 100 MCG/2ML IJ SOLN: 25.0000 ug | INTRAMUSCULAR | Status: DC | PRN

## 2021-10-17 MED ORDER — METOCLOPRAMIDE HCL 5 MG/ML IJ SOLN: 5.0000 mg | Freq: Three times a day (TID) | INTRAMUSCULAR | Status: DC | PRN

## 2021-10-17 MED ORDER — BUPIVACAINE HCL (PF) 0.5 % IJ SOLN
INTRAMUSCULAR | Status: AC
  Filled 2021-10-17: qty 30

## 2021-10-17 MED ORDER — OXYCODONE HCL 5 MG/5ML PO SOLN: 5.0000 mg | Freq: Once | ORAL | Status: DC | PRN

## 2021-10-17 MED ORDER — PROPOFOL 1000 MG/100ML IV EMUL
INTRAVENOUS | Status: AC
  Filled 2021-10-17: qty 100

## 2021-10-17 MED ORDER — PHENYLEPHRINE HCL (PRESSORS) 10 MG/ML IV SOLN
INTRAVENOUS | Status: DC | PRN
  Administered 2021-10-17 (×2): 80 ug via INTRAVENOUS

## 2021-10-17 MED ORDER — CEFAZOLIN IN SODIUM CHLORIDE 3-0.9 GM/100ML-% IV SOLN
3.0000 g | INTRAVENOUS | Status: AC
  Administered 2021-10-17: 3 g via INTRAVENOUS
  Filled 2021-10-17: qty 100

## 2021-10-17 MED ORDER — ACETAMINOPHEN 10 MG/ML IV SOLN
INTRAVENOUS | Status: DC | PRN
  Administered 2021-10-17: 1000 mg via INTRAVENOUS

## 2021-10-17 MED ORDER — ONDANSETRON HCL 4 MG/2ML IJ SOLN: 4.0000 mg | Freq: Four times a day (QID) | INTRAMUSCULAR | Status: DC | PRN

## 2021-10-17 MED ORDER — ACETAMINOPHEN 10 MG/ML IV SOLN
INTRAVENOUS | Status: AC
  Filled 2021-10-17: qty 100

## 2021-10-17 MED ORDER — ONDANSETRON HCL 4 MG PO TABS: 4.0000 mg | ORAL_TABLET | Freq: Four times a day (QID) | ORAL | Status: DC | PRN

## 2021-10-17 SURGICAL SUPPLY — 38 items
APL PRP STRL LF DISP 70% ISPRP (MISCELLANEOUS) ×2
BNDG CMPR 5X4 CHSV STRCH STRL (GAUZE/BANDAGES/DRESSINGS) ×2
BNDG CMPR STD VLCR NS LF 5.8X2 (GAUZE/BANDAGES/DRESSINGS) ×2
BNDG COHESIVE 4X5 TAN STRL LF (GAUZE/BANDAGES/DRESSINGS) ×2 IMPLANT
BNDG ELASTIC 2X5.8 VLCR NS LF (GAUZE/BANDAGES/DRESSINGS) ×2 IMPLANT
BNDG ELASTIC 2X5.8 VLCR STR LF (GAUZE/BANDAGES/DRESSINGS) ×2 IMPLANT
BNDG ESMARK 4X12 TAN STRL LF (GAUZE/BANDAGES/DRESSINGS) ×2 IMPLANT
CHLORAPREP W/TINT 26 (MISCELLANEOUS) ×2 IMPLANT
CORD BIP STRL DISP 12FT (MISCELLANEOUS) ×2 IMPLANT
CUFF TOURN SGL QUICK 18X4 (TOURNIQUET CUFF) ×2 IMPLANT
DRAPE SURG 17X11 SM STRL (DRAPES) ×4 IMPLANT
FORCEPS JEWEL BIP 4-3/4 STR (INSTRUMENTS) ×2 IMPLANT
GAUZE SPONGE 4X4 12PLY STRL (GAUZE/BANDAGES/DRESSINGS) ×2 IMPLANT
GAUZE XEROFORM 1X8 LF (GAUZE/BANDAGES/DRESSINGS) ×2 IMPLANT
GLOVE BIO SURGEON STRL SZ8 (GLOVE) ×4 IMPLANT
GLOVE SURG UNDER LTX SZ8 (GLOVE) ×2 IMPLANT
GOWN STRL REUS W/ TWL LRG LVL3 (GOWN DISPOSABLE) ×2 IMPLANT
GOWN STRL REUS W/ TWL XL LVL3 (GOWN DISPOSABLE) ×2 IMPLANT
GOWN STRL REUS W/TWL LRG LVL3 (GOWN DISPOSABLE) ×2
GOWN STRL REUS W/TWL XL LVL3 (GOWN DISPOSABLE) ×2
KIT CARPAL TUNNEL (MISCELLANEOUS) ×2
KIT ESCP INSRT D SLOT CANN KN (MISCELLANEOUS) ×2 IMPLANT
KIT TURNOVER KIT A (KITS) ×2 IMPLANT
MANIFOLD NEPTUNE II (INSTRUMENTS) ×2 IMPLANT
NDL HYPO 25X1 1.5 SAFETY (NEEDLE) ×2 IMPLANT
NEEDLE HYPO 25X1 1.5 SAFETY (NEEDLE) ×2 IMPLANT
NS IRRIG 500ML POUR BTL (IV SOLUTION) ×2 IMPLANT
PACK EXTREMITY ARMC (MISCELLANEOUS) ×2 IMPLANT
SPLINT WRIST LG LT TX990309 (SOFTGOODS) IMPLANT
SPLINT WRIST LG RT TX900304 (SOFTGOODS) IMPLANT
SPLINT WRIST M LT TX990308 (SOFTGOODS) IMPLANT
SPLINT WRIST M RT TX990303 (SOFTGOODS) IMPLANT
SPLINT WRIST XL LT TX990310 (SOFTGOODS) IMPLANT
SPLINT WRIST XL RT TX990305 (SOFTGOODS) IMPLANT
STOCKINETTE IMPERVIOUS 9X36 MD (GAUZE/BANDAGES/DRESSINGS) ×2 IMPLANT
SUT PROLENE 4 0 PS 2 18 (SUTURE) ×2 IMPLANT
TRAP FLUID SMOKE EVACUATOR (MISCELLANEOUS) ×2 IMPLANT
WATER STERILE IRR 500ML POUR (IV SOLUTION) ×2 IMPLANT

## 2021-10-17 NOTE — Transfer of Care (Signed)
Immediate Anesthesia Transfer of Care Note  Patient: Richard Duncan  Procedure(s) Performed: CARPAL TUNNEL RELEASE ENDOSCOPIC (Left: Wrist) RELEASE TRIGGER FINGER/A-1 PULLEY (Left: Thumb)  Patient Location: PACU  Anesthesia Type:General  Level of Consciousness: awake  Airway & Oxygen Therapy: Patient Spontanous Breathing and Patient connected to face mask oxygen  Post-op Assessment: Report given to RN and Post -op Vital signs reviewed and stable  Post vital signs: Reviewed and stable  Last Vitals:  Vitals Value Taken Time  BP 118/69 10/17/21 1300  Temp    Pulse 64 10/17/21 1304  Resp 17 10/17/21 1304  SpO2 100 % 10/17/21 1304  Vitals shown include unvalidated device data.  Last Pain:  Vitals:   10/17/21 1004  TempSrc: Temporal  PainSc: 7          Complications: No notable events documented.

## 2021-10-17 NOTE — Anesthesia Procedure Notes (Signed)
Procedure Name: LMA Insertion Date/Time: 10/17/2021 12:05 PM  Performed by: Biagio Borg, CRNAPre-anesthesia Checklist: Patient identified, Emergency Drugs available, Suction available and Patient being monitored Patient Re-evaluated:Patient Re-evaluated prior to induction Oxygen Delivery Method: Circle system utilized Preoxygenation: Pre-oxygenation with 100% oxygen Induction Type: IV induction Ventilation: Mask ventilation without difficulty LMA: LMA inserted LMA Size: 5.0 Tube type: Oral Number of attempts: 1 Placement Confirmation: positive ETCO2 and breath sounds checked- equal and bilateral Tube secured with: Tape Dental Injury: Teeth and Oropharynx as per pre-operative assessment

## 2021-10-17 NOTE — Anesthesia Preprocedure Evaluation (Addendum)
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    Pulmonary shortness of breath, sleep apnea and Continuous Positive Airway Pressure Ventilation , COPD, former smoker,    Pulmonary exam normal breath sounds clear to auscultation       Cardiovascular Exercise Tolerance: Poor hypertension, Normal cardiovascular exam+ dysrhythmias + pacemaker (Presence of biventricular cardiac pacemaker. Complete Heart Block)  Rhythm:regular Rate:Normal  Presence of biventricular cardiac pacemaker. Interrogated in June 2023   TTE 02/08/2020: EF >55%, mild LVH, triv MR/TR   Neuro/Psych PSYCHIATRIC DISORDERS Anxiety Depression Bilateral carpal tunnel syndrome  s/p ACDF C5-C6; resulting/residual postoperative neck "stiffness"  Chronic Pain Syndrome/DDD/Cervicalgia   Neuromuscular disease    GI/Hepatic Neg liver ROS, hiatal hernia, GERD  Controlled,  Endo/Other  Morbid obesity  Renal/GU Renal InsufficiencyRenal disease     Musculoskeletal  (+) Arthritis , s/p lumbar laminectomy x 2   Abdominal   Peds  Hematology negative hematology ROS (+) Blood dyscrasia, anemia ,   Anesthesia Other Findings Urinary retention with PROPRANOLOL and PROPOFOL; has required urinary catheterization in the past  Spinal cord stimulator - Battery Died  Reproductive/Obstetrics negative OB ROS                            Anesthesia Physical  Anesthesia Plan  ASA: III  Anesthesia  Plan: General   Post-op Pain Management: Regional block* and Minimal or no pain anticipated   Induction: Intravenous  PONV Risk Score and Plan: Ondansetron and Dexamethasone  Airway Management Planned: LMA  Additional Equipment:   Intra-op Plan:   Post-operative Plan:   Informed Consent: I have reviewed the patients History and Physical, chart, labs and discussed the procedure including the risks, benefits and alternatives for the proposed anesthesia with the patient or authorized representative who has indicated his/her understanding and acceptance.     Dental Advisory Given  Plan Discussed with: CRNA  Anesthesia Plan Comments: (Talked with patient about urinary retention after surgeries where propofol was used.  Discussed the rather long case times of previous surgeries as being a factor in urinary retention, but also acknowledging the possibility of untoward reactions to meds.  The patient is aggreable to using propofol. States he did not have retention after colonoscopy.)       Anesthesia Quick Evaluation

## 2021-10-17 NOTE — H&P (Signed)
History of Present Illness:  Richard Duncan is a 71 y.o. male who has been referred by Reche Dixon, PA-C, for evaluation and treatment of painful recurrent catching of both thumbs, left more symptomatic than right. The patient notes that the symptoms have been going on for 3 to 4 months and developed without any specific cause or injury. He saw Reche Dixon, PA-C, for the symptoms. He was diagnosed with bilateral trigger thumbs and given steroid injections which he states provided little if any relief of his symptoms. Therefore, he has been referred to me for discussion of further treatment options. The patient notes that his symptoms are aggravated by repetitive activities. He has been taking Tylenol and applying ice and heat with limited benefit. He is right-hand dominant.  The patient also complains of a long history of bilateral hand and wrist pain and paresthesias. He apparently was diagnosed with carpal tunnel syndrome while living in [place]. 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 The patient notes that he has tried splinting his wrist in the past without benefit, and is taking various anti-inflammatory medications over the years, also without benefit. He is hands are "constantly numb" but do not awaken him from sleep. He has increased numbness/paresthesias with any repetitive activities such as driving or holding objects. He also frequently will drop things.  Current Outpatient Medications: acetaminophen (TYLENOL) 500 MG tablet Take by mouth once as needed  albuterol 90 mcg/actuation inhaler Inhale 2 inhalations into the lungs every 6 (six) hours as needed for Wheezing 1 each 6  amLODIPine (NORVASC) 10 MG tablet TAKE 1 TABLET BY MOUTH EVERY DAY 90 tablet 1  atorvastatin (LIPITOR) 20 MG tablet Take 1 tablet (20 mg  total) by mouth once daily 90 tablet 3  benazepriL (LOTENSIN) 40 MG tablet TAKE 1 TABLET BY MOUTH EVERY DAY 90 tablet 3  buprenorphine-naloxone (SUBOXONE) 2-0.5 mg SL film PLACE 1 FILM (2 MG OF BUPRENORPHINE TOTAL) UNDER THE TONGUE TWO (2) TIMES A DAY. DNF 03/29/19  chlorthalidone 25 MG tablet Take 1 tablet (25 mg total) by mouth once daily 90 tablet 3  cholecalciferol (VITAMIN D3) 2,000 unit capsule Take 2,000 Units by mouth once daily.  citalopram (CELEXA) 40 MG tablet TAKE 1 TABLET BY MOUTH EVERY DAY 90 tablet 3  methocarbamoL (ROBAXIN) 500 MG tablet TAKE 1 TABLET (500 MG TOTAL) BY MOUTH TWO (2) TIMES A DAY AS NEEDED (MUSCLE PAIN/SPASMS).  metoprolol tartrate (LOPRESSOR) 50 MG tablet Take 1 tablet (50 mg total) by mouth 2 (two) times daily 180 tablet 3  pantoprazole (PROTONIX) 40 MG DR tablet TAKE 1 TABLET BY MOUTH EVERY DAY 90 tablet 3  potassium citrate (UROCIT-K) 15 mEq ER tablet  sildenafil (VIAGRA) 25 MG tablet Take 125 mg by mouth once daily as needed for Erectile Dysfunction  SYMBICORT 160-4.5 mcg/actuation inhaler INHALE 2 INHALATIONS INTO THE LUNGS 2 (TWO) TIMES DAILY. REPLACES TRELEGY 10.2 g 4   Allergies  Propofol Other (Urinary retention requiring catheterization)  Propranolol Other (Urinary retention)   Past Medical History:  Allergic rhinitis due to allergen  Allergy 2014  Anxiety  Arrhythmia 06/2013  Arthritis  Bronchitis, chronic (CMS-HCC)  Carpal tunnel syndrome  COPD (chronic obstructive pulmonary disease) (CMS-HCC)  with CPAP  Depression  Encounter for blood transfusion 2014  GERD (gastroesophageal reflux disease)  on and off for quite a while.  Heart disease  History of cataract 2021  Both eyes done.  Hyperlipidemia  Hypertension  Kidney disease  Obesity (Most of my life)  Osteoporosis, post-menopausal  Pain medication agreement signed 08/26/2017  [facility]ES opioid agreement reviewed, signed and copy given to patient.  Pneumonia  Prostate cancer (CMS-HCC)   Sleep apnea   Past Surgical History:  REPLACEMENT TOTAL HIP W/ RESURFACING IMPLANTS Left 2004  Revision total hip 10/22/2012  COLONOSCOPY 12/18/2004  Adenomatous Polyps  Revision of femoral head component. Attempted revision of femoral stem and acetabular 07/22/2013  COLONOSCOPY 01/24/2014  PH Adenomatous Polyps: CBF 12/2018  Excision of Lipoma 03/08/2016  Dr Rochel Brome --- 2 abdominal area  COLONOSCOPY 06/25/2019  Dr Chuck Hint- polyps removed from the transverse colon and rectosigmoid colon- most of which were adenomas.3y repeat  [place], 2000  laminectomy lumbar spine  BRONCHOSCOPY  CATARACT EXTRACTION 2021  Fusion of spine 2000s (cervical region)  INSERT / REPLACE / REMOVE PACEMAKER  JOINT REPLACEMENT  PROSTATE SURGERY 2020  TONSILLECTOMY   Family History:  Arthritis Mother  Osteoporosis (Thinning of bones) Mother  High blood pressure (Hypertension) Mother  Alzheimer's disease Mother  Osteoarthritis Mother  Arthritis Father  Prostate cancer Father  Diabetes Father  HIV Brother (AIDS)  Arthritis Maternal Grandmother  No Known Problems Daughter  No Known Problems Daughter   Social History:   Socioeconomic History:  Marital status: Married  Tobacco Use  Smoking status: Former  Packs/day: 2.00  Years: 2.00  Pack years: 4.00  Types: Cigarettes, Pipe, Cigars  Start date: 06/21/1965  Quit date: 07/14/1967  Years since quitting: 54.2  Smokeless tobacco: Never  Vaping Use  Vaping Use: Never used  Substance and Sexual Activity  Alcohol use: Not Currently  Comment: Very rarely.  Drug use: No  Sexual activity: Yes  Partners: Female  Birth control/protection: Post-menopausal   Review of Systems:  A comprehensive 14 point ROS was performed, reviewed, and the pertinent orthopaedic findings are documented in the HPI.  Physical Exam: Vitals:  09/19/21 1006  BP: 124/78  Weight: (!) 158.7 kg (349 lb 12.8 oz)  Height: 185.4 cm ('6\' 1"'$ )   PainSc: 8  PainLoc: Hand   General/Constitutional: Pleasant significantly overweight middle-age male in no acute distress. Neuro/Psych: Normal mood and affect, oriented to person, place and time. Eyes: Non-icteric. Pupils are equal, round, and reactive to light, and exhibit synchronous movement. ENT: Unremarkable. Lymphatic: No palpable adenopathy. Respiratory: Lungs clear to auscultation, Normal chest excursion, No wheezes, and Non-labored breathing Cardiovascular: Regular rate and rhythm. No murmurs. and No edema, swelling or tenderness, except as noted in detailed exam. Integumentary: No impressive skin lesions present, except as noted in detailed exam. Musculoskeletal: Unremarkable, except as noted in detailed exam.  Left wrist/hand exam: Skin inspection of the left wrist and hand is notable for mild thenar atrophy but otherwise is unremarkable. No swelling, erythema, ecchymosis, abrasions, or other skin abnormalities are identified. He exhibits full active and passive range of motion of the left wrist without any pain or catching. He is able to actively flex and extend all digits fully, although he has intermittent mild "catching" of the thumb with flexion and extension. In addition, he has mild tenderness to palpation over the volar aspect of the thumb at the level of the MCP joint. He is neurovascular intact all digits. He has an equivocally positive Phalen's test but a negative Tinel's sign over the carpal tunnel.  X-rays/MRI/Lab data:  Recent AP, lateral, and oblique views of the left hand are available for review and have been reviewed by myself. These films demonstrate moderate degenerative changes of the left thumb CMC joint with radial subluxation of the thumb metacarpal, but otherwise are unremarkable. No fractures, lytic lesions, or other acute bony abnormalities are identified.  Assessment: 1. Carpal tunnel syndrome, left.  2. Trigger finger of left thumb.   Plan: The  treatment options were discussed with the patient. In addition, patient educational materials were provided regarding the diagnosis and treatment options. The patient is quite frustrated by his symptoms and functional limitations, especially as they pertain to his left hand, and is ready to consider more aggressive treatment options. Therefore, I have recommended a surgical procedure, specifically an endoscopic left carpal tunnel release with release of the left trigger thumb. The procedure was discussed with the patient, as were the potential risks (including bleeding, infection, nerve and/or blood vessel injury, persistent or recurrent pain/paresthesias, recurrent triggering of the thumb, stiffness of the thumb, weakness of grip, need for further surgery, blood clots, strokes, heart attacks and/or arhythmias, pneumonia, etc.) and benefits. The patient states his understanding and wishes to proceed. All of the patient's questions and concerns were answered. He can call any time with further concerns. He will follow up post-surgery, routine.   H&P reviewed and patient re-examined. No changes.

## 2021-10-17 NOTE — Discharge Instructions (Addendum)
Orthopedic discharge instructions: Keep dressing dry and intact. Keep hand elevated above heart level. May shower after dressing removed on postop day 4 (Sunday). Cover sutures with Band-Aids after drying off. Apply ice to affected area frequently. Take ES Tylenol or pain medication as prescribed when needed.  Return for follow-up in 10-14 days or as scheduled.      AMBULATORY SURGERY  DISCHARGE INSTRUCTIONS   The drugs that you were given will stay in your system until tomorrow so for the next 24 hours you should not:  Drive an automobile Make any legal decisions Drink any alcoholic beverage   You may resume regular meals tomorrow.  Today it is better to start with liquids and gradually work up to solid foods.  You may eat anything you prefer, but it is better to start with liquids, then soup and crackers, and gradually work up to solid foods.   Please notify your doctor immediately if you have any unusual bleeding, trouble breathing, redness and pain at the surgery site, drainage, fever, or pain not relieved by medication.     Your post-operative visit with Dr.                                       is: Date:                        Time:    Please call to schedule your post-operative visit.  Additional Instructions:

## 2021-10-17 NOTE — Op Note (Signed)
10/17/2021  1:01 PM  Patient:   Richard Duncan  Pre-Op Diagnosis:   1.  Left carpal tunnel syndrome.  2.  Left trigger thumb.  Post-Op Diagnosis:   Same.  Procedure:   1.  Endoscopic left carpal tunnel release.  2.  Release left trigger thumb.  Surgeon:   Pascal Lux, MD  Anesthesia:   General LMA  Findings:   As above.  Complications:   None  EBL:   0 cc  Fluids:   800 cc crystalloid  TT:   22 minutes at 250 mmHg  Drains:   None  Closure:   4-0 Prolene interrupted sutures  Brief Clinical Note:   The patient is a morbidly obese 71 year old male with a long history of progressively worsening pain and paresthesias to his left hand.  His symptoms have progressed despite medications, activity modification, etc.  His history and examination are consistent with carpal tunnel syndrome.  The patient presents at this time for an endoscopic right carpal tunnel release.   The patient also notes recurrent painful catching of his left thumb.  The symptoms also have been present for many months and have persisted despite medications, activity modification, etc.  His history and examination also are consistent with a left trigger thumb.  The patient would like to have this released at this time as well.  Procedure:   The patient was brought into the operating room and lain in the supine position. After adequate general laryngeal mask anesthesia was obtained, the left hand and upper extremity were prepped with ChloraPrep solution before being draped sterilely. Preoperative antibiotics were administered. A timeout was performed to verify the appropriate surgical site before the limb was exsanguinated with an Esmarch and the tourniquet inflated to 250 mmHg.   First the carpal tunnel was addressed. An approximately 1.5-2 cm incision was made over the volar wrist flexion crease, centered over the palmaris longus tendon. The incision was carried down through the subcutaneous tissues with care  taken to identify and protect any neurovascular structures. The distal forearm fascia was penetrated just proximal to the transverse carpal ligament. The soft tissues were released off the superficial and deep surfaces of the distal forearm fascia and this was released proximally for 3-4 cm under direct visualization.  Attention was directed distally. The Soil scientist was passed beneath the transverse carpal ligament along the ulnar aspect of the carpal tunnel and used to release any adhesions as well as to remove any adherent synovial tissue before first the smaller then the larger of the two dilators were passed beneath the transverse carpal ligament along the ulnar margin of the carpal tunnel. The slotted cannula was introduced and the endoscope was placed into the slotted cannula and the undersurface of the transverse carpal ligament visualized. The distal margin of the transverse carpal ligament was marked by placing a 25-gauge needle percutaneously at [place] cardinal point so that it entered the distal portion of the slotted cannula. Under endoscopic visualization, the transverse carpal ligament was released from proximal to distal using the end-cutting blade. A second pass was performed to ensure complete release of the ligament. The adequacy of release was verified both endoscopically and by palpation using the freer elevator.  Next, the left trigger thumb was addressed. An approximately 1.5-2.0 cm incision was made over the volar aspect of the left thumb at the level of the metacarpal head centered over the flexor sheath. The incision was carried down through the subcutaneous tissues with care taken to  identify and protect the digital neurovascular structures. The flexor sheath was entered just proximal to the A1 pulley. The sheath was released proximally for several centimeters under direct visualization. Distally, a clamp was placed beneath the A1 pulley and used to release any adhesions. The  clamp was repositioned so that one jaw was superficial to and the other jaw deep to the A1 pulley. The A1 pulley was incised on either side of the clamp to remove a 2 mm strip of tissue. Metzenbaum scissors were used to ensure complete release of the A1 pulley more distally. The underlying tendons were carefully inspected and found to be intact.   Each wound was irrigated thoroughly with sterile saline solution before being closed using 4-0 Prolene interrupted sutures. A total of 15 cc of 0.5% plain Sensorcaine was injected in and around the two incisions before a sterile bulky dressing was applied to the wrist and hand. The patient was placed into a volar wrist splint before being awakened, extubated, and returned to the recovery room in satisfactory condition after tolerating the procedure well.

## 2021-10-18 ENCOUNTER — Encounter: Payer: Self-pay | Admitting: Surgery

## 2021-10-18 NOTE — Anesthesia Postprocedure Evaluation (Signed)
Anesthesia Post Note  Patient: Dhyan Noah Thornell  Procedure(s) Performed: CARPAL TUNNEL RELEASE ENDOSCOPIC (Left: Wrist) RELEASE TRIGGER FINGER/A-1 PULLEY (Left: Thumb)  Patient location during evaluation: PACU Anesthesia Type: General Level of consciousness: awake and alert Pain management: pain level controlled Vital Signs Assessment: post-procedure vital signs reviewed and stable Respiratory status: spontaneous breathing, nonlabored ventilation and respiratory function stable Cardiovascular status: blood pressure returned to baseline and stable Postop Assessment: no apparent nausea or vomiting Anesthetic complications: no   No notable events documented.   Last Vitals:  Vitals:   10/17/21 1330 10/17/21 1338  BP: 99/67   Pulse: (!) 59 85  Resp: 16 16  Temp: (!) 36.1 C (!) 36.1 C  SpO2: 98% 93%    Last Pain:  Vitals:   10/18/21 0756  TempSrc:   PainSc: [place]

## 2022-02-19 ENCOUNTER — Other Ambulatory Visit: Payer: Self-pay | Admitting: Surgery

## 2022-02-25 ENCOUNTER — Encounter
Admission: RE | Admit: 2022-02-25 | Discharge: 2022-02-25 | Disposition: A | Discharge status: Home / Self Care | Payer: Medicare HMO | Source: Ambulatory Visit | Attending: Surgery | Admitting: Surgery

## 2022-02-25 ENCOUNTER — Other Ambulatory Visit: Payer: Self-pay

## 2022-02-25 VITALS — Wt 330.0 lb

## 2022-02-25 DIAGNOSIS — Z01812 Encounter for preprocedural laboratory examination: Secondary | ICD-10-CM

## 2022-02-25 HISTORY — DX: Prediabetes: R73.03

## 2022-02-25 NOTE — Patient Instructions (Addendum)
Your procedure is scheduled on: 03/06/22 - Wednesday Report to the Registration Desk on the 1st floor of the [facility]. To find out your arrival time, please call [phone] between 1PM - 3PM on: 03/05/22 - Tuesday If your arrival time is 6:00 am, do not arrive prior to that time as the [facility] entrance doors do not open until 6:00 am.  REMEMBER: Instructions that are not followed completely may result in serious medical risk, up to and including death; or upon the discretion of your surgeon and anesthesiologist your surgery may need to be rescheduled.  Do not eat food after midnight the night before surgery.  No gum chewing, lozengers or hard candies.  You may however, drink CLEAR liquids up to 2 hours before you are scheduled to arrive for your surgery. Do not drink anything within 2 hours of your scheduled arrival time.  Clear liquids include: - water  - apple juice without pulp - gatorade (not RED colors) - black coffee or tea (Do NOT add milk or creamers to the coffee or tea) Do NOT drink anything that is not on this list.  In addition, your doctor has ordered for you to drink the provided  Ensure Pre-Surgery Clear Carbohydrate Drink  Drinking this carbohydrate drink up to two hours before surgery helps to reduce insulin resistance and improve patient outcomes. Please complete drinking 2 hours prior to scheduled arrival time.  TAKE THESE MEDICATIONS THE MORNING OF SURGERY WITH A SIP OF WATER:  - Amlodipine - Atorvastatin - Symbicort inhaler - Citalopram - Metoprolol - Pantoprazole - (take one the night before and one on the morning of surgery - helps to prevent nausea after surgery.) - fluticasone (FLONASE)  - methocarbamol (ROBAXIN)    One week prior to surgery: Stopped beginning 02/27/22. Stop Anti-inflammatories (NSAIDS) such as Advil, Aleve, Ibuprofen, Motrin, Naproxen, Naprosyn and Aspirin based products such as Excedrin, Goodys Powder, BC Powder.  Stop  ANY OVER THE COUNTER supplements until after surgery.  You may however, continue to take Tylenol if needed for pain up until the day of surgery.  No Alcohol for 24 hours before or after surgery.  No Smoking including e-cigarettes for 24 hours prior to surgery.  No chewable tobacco products for at least 6 hours prior to surgery.  No nicotine patches on the day of surgery.  Do not use any "recreational" drugs for at least a week prior to your surgery.  Please be advised that the combination of cocaine and anesthesia may have negative outcomes, up to and including death. If you test positive for cocaine, your surgery will be cancelled.  On the morning of surgery brush your teeth with toothpaste and water, you may rinse your mouth with mouthwash if you wish. Do not swallow any toothpaste or mouthwash.  Use CHG Soap or wipes as directed on instruction sheet.  Do not wear jewelry, make-up, hairpins, clips or nail polish.  Do not wear lotions, powders, or perfumes.   Do not shave body from the neck down 48 hours prior to surgery just in case you cut yourself which could leave a site for infection.  Also, freshly shaved skin may become irritated if using the CHG soap.  Contact lenses, hearing aids and dentures may not be worn into surgery.  Do not bring valuables to the hospital. [facility] is not responsible for any missing/lost belongings or valuables.   Bring your C-PAP to the hospital with you in case you may have to spend the  night.   Notify your doctor if there is any change in your medical condition (cold, fever, infection).  Wear comfortable clothing (specific to your surgery type) to the hospital.  After surgery, you can help prevent lung complications by doing breathing exercises.  Take deep breaths and cough every 1-2 hours. Your doctor may order a device called an Incentive Spirometer to help you take deep breaths. When coughing or sneezing, hold a pillow firmly against your  incision with both hands. This is called "splinting." Doing this helps protect your incision. It also decreases belly discomfort.  If you are being admitted to the hospital overnight, leave your suitcase in the car. After surgery it may be brought to your room.  If you are being discharged the day of surgery, you will not be allowed to drive home. You will need a responsible adult (18 years or older) to drive you home and stay with you that night.   If you are taking public transportation, you will need to have a responsible adult (18 years or older) with you. Please confirm with your physician that it is acceptable to use public transportation.   Please call the [facility] at [phone] if you have any questions about these instructions.  Surgery Visitation Policy:  Patients undergoing a surgery or procedure may have two family members or support persons with them as long as the person is not COVID-19 positive or experiencing its symptoms.   Inpatient Visitation:    Visiting hours are 7 a.m. to 8 p.m. Up to four visitors are allowed at one time in a patient room. The visitors may rotate out with other people during the day. One designated support person (adult) may remain overnight.  Due to an increase in RSV and influenza rates and associated hospitalizations, children ages 28 and under will not be able to visit patients in [facility]. Masks continue to be strongly recommended.

## 2022-02-27 ENCOUNTER — Encounter: Payer: Self-pay | Admitting: Surgery

## 2022-02-27 NOTE — Progress Notes (Signed)
Perioperative Services  Pre-Admission/Anesthesia Testing Clinical Review  Date: 02/27/22  Patient Demographics:  Name: Richard Duncan DOB:   [birth year] MRN:   [id number]  Planned Surgical Procedure(s):    Case: [id number] Date/Time: 03/06/22 1046   Procedures:      CARPAL TUNNEL RELEASE ENDOSCOPIC (Right: Wrist)     RELEASE TRIGGER FINGER/A-1 PULLEY (Right: Thumb)   Anesthesia type: Choice   Pre-op diagnosis:      Trigger finger of right thumb M65.311     Carpal tunnel syndrome, right G56.01   Location: [facility] OR ROOM 02 / [place] [facility]   Surgeons: Corky Mull, MD   NOTE: Available PAT nursing documentation and vital signs have been reviewed. Clinical nursing staff has updated patient's PMH/PSHx, current medication list, and drug allergies/intolerances to ensure comprehensive history available to assist in medical decision making as it pertains to the aforementioned surgical procedure and anticipated anesthetic course. Extensive review of available clinical information performed. [place] PMH and PSHx updated with any diagnoses/procedures that  may have been inadvertently omitted during his intake with the pre-admission testing department's nursing staff.  Clinical Discussion:  Richard Duncan is a 72 y.o. male who is submitted for pre-surgical anesthesia review and clearance prior to him undergoing the above procedure. Patient is a Former Smoker (quit 05/1966). Pertinent PMH includes: CAD, complete heart block (s/p PPM placement), diastolic dysfunction, aortic atherosclerosis, HTN, HLD, prediabetes, dyspnea, COPD, OSAH (requires nocturnal PAP therapy), GERD (on daily PPI), hiatal hernia, anemia, ED (on PDE5i), OA, cervical DDD (s/p ACDF C5-C6), thoracolumbar spondylosis (s/p laminectomy x 2), lower extremity neuropathy, PTSD, anxiety, depression, chronic pain syndrome (on buprenorphine/naloxone).  Patient is followed by cardiology Nehemiah Massed, MD). He was last seen  in the cardiology clinic on 10/09/2021; notes reviewed.  At the time of his clinic visit, patient denied any episodes of chest pain, however he continues to experience shortness of breath.  He denied any PND, orthopnea, palpitations, significant peripheral edema, vertiginous symptoms, or presyncope/syncope.  Patient with a past medical history significant for cardiovascular diagnoses.  Patient with a history of acquired high-grade (complete) heart block.  He underwent placement of a Medtronic biventricular pacemaker on 06/28/2011.  Pulse generator was changed on 03/08/2021.  Device is regularly interrogated by his primary cardiology team. Last interrogation was on 12/19/2021, at which time device was noted to be functioning properly.   Diagnostic LEFT heart catheterization performed on 09/13/2011 revealing a normal left ventricular systolic function with an EF of 60%.  There were 40% stenoses noted within the OM1, LAD, and LCx, in addition to minor luminal irregularities noted within mid RCA.  Intervention was deferred opting for medical management.  Myocardial perfusion imaging study performed on 10/14/2016 revealing a mildly reduced left ventricular systolic function with an EF of 45-55%.  There was no evidence of stress-induced myocardial ischemia or arrhythmia; no scintigraphic evidence of scar.  Study determined to be normal and low risk.  Last TTE was performed on 02/08/2020 revealing a normal left ventricular systolic function with mild LVH; LVEF >55%.  There was trivial tricuspid and mitral valve regurgitation.  There was no evidence of a significant transvalvular gradient to suggest stenosis.  Blood pressure well controlled at 122/80 mmHg on currently prescribed CCB (amlodipine), ACEi (benazepril), diuretic (chlorthalidone), and beta-blocker (metoprolol tartrate) therapies.  Patient is on atorvastatin for his HLD diagnosis and further ASCVD prevention.  Of note, in the setting of known  cardiovascular disease, it is important to note that patient  is on a PDE5i (tadalafil) for erectile dysfunction diagnosis. Patient is prediabetic; last Hgb A1c was 5.8% when checked on 12/04/2021. Discussed adding an SGLT2i, however patient wished to defer treatment at this time. He does have an OSAH diagnosis and is reported to be compliant with prescribed nocturnal PAP therapy.  Patient with multiple medical comorbidities and arthritides affecting his functional capacity.  Per the DASI, patient unable to achieve 4 METS of activity without experiencing angina/anginal equivalent symptoms.  No changes were made to his medication regimen.  Patient follow-up with outpatient cardiology in 6 months or sooner if needed.  Richard Duncan is scheduled for an elective RIGHT ENDOSCOPIC CARPAL TUNNEL RELEASE ENDOSCOPIC; RELEASE RIGHT TRIGGER FINGER/A-1 PULLEY on 03/06/2022 with Dr. Milagros Evener, MD.  Given patient's past medical history significant for cardiovascular diagnoses, presurgical cardiac clearance was sought by the PAT team. Per cardiology, "the patient is at the lowest risk possible for perioperative cardiovascular complications with the planned procedure.  The overall risk his procedure is low (<1%).  Currently has no evidence active and/or significant angina and/or congestive heart failure. Patient may proceed to surgery without restriction or need for further cardiovascular testing at an overall LOW risk".  In review of his medication reconciliation, the patient is not noted to be taking any type of anticoagulation or antiplatelet therapies that would need to be held during his perioperative course.  Patient reports previous perioperative complications with anesthesia in the past.  Patient has experienced (+) urinary retention with the use of both propranolol and propofol.  He reports that he has required urinary catheterization in the past with the use of these agents.  Additionally, patient has been found to  be a (+) difficult intubation due to macroglossia and reduced cervical mobility following cervical fusion. In review of the available records, it is noted that patient underwent a MAC anesthetic course here at [facility] in 09/2021 without documented complications.      02/25/2022    3:06 PM 10/17/2021    1:38 PM 10/17/2021    1:30 PM  Vitals with BMI  Weight 659 lbs    Systolic   99  Diastolic   67  Pulse  85 59    Providers/Specialists:   NOTE: Primary physician provider listed below. Patient may have been seen by APP or partner within same practice.   PROVIDER ROLE / SPECIALTY LAST OV  Poggi, Marshall Cork, MD Orthopedics (Surgeon) 01/23/2022  Sofie Hartigan, MD Primary Care Provider 12/04/2021  Serafina Royals, MD Cardiology 10/09/2021  Lyla Son, MD Nephrology 11/14/2021  Felton Clinton, MD Pain Management 01/15/2022   Allergies:  Other, Propofol, and Propranolol  Current Home Medications:   No current facility-administered medications for this encounter.    amLODipine (NORVASC) 10 MG tablet   atorvastatin (LIPITOR) 20 MG tablet   benazepril (LOTENSIN) 40 MG tablet   budesonide-formoterol (SYMBICORT) 160-4.5 MCG/ACT inhaler   Buprenorphine HCl-Naloxone HCl 2-0.5 MG FILM   chlorthalidone (HYGROTON) 25 MG tablet   Cholecalciferol (VITAMIN D3 PO)   citalopram (CELEXA) 40 MG tablet   cyanocobalamin (VITAMIN B12) 1000 MCG tablet   fluticasone (FLONASE) 50 MCG/ACT nasal spray   methocarbamol (ROBAXIN) 500 MG tablet   metoprolol tartrate (LOPRESSOR) 50 MG tablet   pantoprazole (PROTONIX) 40 MG tablet   Potassium Citrate 15 MEQ (1620 MG) TBCR   tadalafil (CIALIS) 10 MG tablet   History:   Past Medical History:  Diagnosis Date   Anemia  Anxiety    Aortic atherosclerosis (HCC)    Arthritis    Arthropathia 11/08/2013   Bilateral carpal tunnel syndrome    CAD (coronary artery disease) 09/13/2011   a.) LHC 09/13/2014:  EF 60%. 40% OM1, 40% LAD, 40% LCx, mRCA with minor luminal irregs - med mgmt.   Chronic pain syndrome    a.) followed by pain management; on buprenorphine/naloxone   CKD (chronic kidney disease), stage III ([place])    Complete heart block ([place]) 05/2011   a.) s/p PPM placement 91/63/8466   Complication of anesthesia    a.) urinary retention with PROPRANOLOL and PROPOFOL; has required urinary catheterization in the past   COPD (chronic obstructive pulmonary disease) ([place])    DDD (degenerative disc disease), cervical    a.) s/p ACDF C5-C6; resulting/residual postoperative neck "stiffness"   Depression    Diastolic dysfunction 59/93/5701   a.) TTE 10/10/2016: EF >55%, triv MR/TR/PR, G1DD; b.) TTE 02/08/2020: EF >55%, mild LVH, triv MR/TR   Difficult intubation    a.) secondary to macroglossia + reduced cervical mobility (s/p cervical fusion)   Dyspnea    ED (erectile dysfunction)    a.) on PDE5i (tadalafil)   GERD (gastroesophageal reflux disease)    Hernia, hiatal    History of bilateral cataract extraction 2020   History of blood product transfusion    History of kidney stones    Hyperlipemia    Hypertension    Neuropathy involving both lower extremities    Obesity    OSA on CPAP    Pre-diabetes    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   a.) Medtronic device placed 06/28/2011; b.) generator changed 03/08/2021   Prostate cancer ([place]) 2011   Prostatitis    PTSD (post-traumatic stress disorder)    Spinal cord stimulator status    a.) non-functional; "battery died"   Spondylolysis of thoracolumbar region    a.) s/p lumbar laminectomy x 2   Past Surgical History:  Procedure Laterality Date   ANTERIOR CERVICAL DECOMP/DISCECTOMY FUSION N/A 2008   Procedure: ANTERIOR CERVICAL DECOMP/DISCECTOMY FUSION (C5-C6)   APPENDECTOMY     CARDIAC PACEMAKER PLACEMENT  06/28/2011   Procedure: CARDIAC PACEMAKER PLACEMENT; Location: [place]; Surgeon: Neoma Laming, MD   CARPAL TUNNEL  RELEASE Left 10/17/2021   Procedure: CARPAL TUNNEL RELEASE ENDOSCOPIC;  Surgeon: Corky Mull, MD;  Location: [facility];  Service: Orthopedics;  Laterality: Left;   CATARACT EXTRACTION W/PHACO Right 12/28/2018   Procedure: CATARACT EXTRACTION PHACO AND INTRAOCULAR LENS PLACEMENT (IOC) RIGHT 2.82  00:26.4;  Surgeon: Eulogio Bear, MD;  Location: [place];  Service: Ophthalmology;  Laterality: Right;   CATARACT EXTRACTION W/PHACO Left 01/25/2019   Procedure: CATARACT EXTRACTION PHACO AND INTRAOCULAR LENS PLACEMENT ([place]) LEFT;  Surgeon: Eulogio Bear, MD;  Location: [place];  Service: Ophthalmology;  Laterality: Left;  CDE 11.49 [place] 0:24.4   COLONOSCOPY     2006, 2015, 2021   COLONOSCOPY WITH PROPOFOL N/A 06/25/2019   Procedure: COLONOSCOPY WITH PROPOFOL;  Surgeon: Virgel Manifold, MD;  Location: [facility];  Service: Endoscopy;  Laterality: N/A;  ALLERGIC TO PROPOFOL PER OFFICE   CYSTOSCOPY W/ URETERAL STENT PLACEMENT Bilateral 11/01/2016   Procedure: CYSTOSCOPY WITH STENT REPLACEMENT;  Surgeon: [name], MD;  Location: [facility];  Service: Urology;  Laterality: Bilateral;   CYSTOSCOPY W/ URETERAL STENT PLACEMENT Left 12/10/2016   Procedure: CYSTOSCOPY WITH STENT REPLACEMENT;  Surgeon: Abbie Sons, MD;  Location: [facility];  Service: Urology;  Laterality: Left;  CYSTOSCOPY W/ URETERAL STENT REMOVAL Bilateral 11/27/2016   Procedure: CYSTOSCOPY WITH STENT REMOVAL/EXCHANGE;  Surgeon: [name], MD;  Location: [facility];  Service: Urology;  Laterality: Bilateral;   CYSTOSCOPY WITH STENT PLACEMENT Bilateral 09/27/2016   Procedure: CYSTOSCOPY WITH STENT PLACEMENT;  Surgeon: [name], MD;  Location: [facility];  Service: Urology;  Laterality: Bilateral;   EXCISION OF ABDOMINAL WALL TUMOR N/A 03/08/2016   Procedure: EXCISION OF ABDOMINAL WALL MASS;  Surgeon: Leonie Green, MD;  Location: [facility];  Service: General;   Laterality: N/A;   HOLEP-LASER ENUCLEATION OF THE PROSTATE WITH MORCELLATION N/A 09/25/2018   Procedure: HOLEP-LASER ENUCLEATION OF THE PROSTATE WITH MORCELLATION;  Surgeon: Billey Co, MD;  Location: [facility];  Service: Urology;  Laterality: N/A;   JOINT REPLACEMENT  2004, 2005, 2014, 2015   Bilat Hip Replacements   LEFT HEART CATH AND CORONARY ANGIOGRAPHY Left 09/13/2011   Procedure: LEFT HEART CATH AND CORONARY ANGIOGRAPHY; Location: [place]; Surgeon: Neoma Laming, MD   [place]  2006   PPM GENERATOR CHANGEOUT N/A 03/08/2021   Procedure: PPM GENERATOR CHANGEOUT;  Surgeon: Isaias Cowman, MD;  Location: [place] [facility];  Service: Cardiovascular;  Laterality: N/A;   SPINAL CORD STIMULATOR INSERTION N/A 07/08/2014   Procedure: LUMBAR SPINAL CORD STIMULATOR INSERTION;  Surgeon: Clydell Hakim, MD;  Location: [place];  Service: Neurosurgery;  Laterality: N/A;  LUMBAR SPINAL CORD STIMULATOR INSERTION   TONSILLECTOMY     TRIGGER FINGER RELEASE Left 10/17/2021   Procedure: RELEASE TRIGGER FINGER/A-1 PULLEY;  Surgeon: Corky Mull, MD;  Location: [facility];  Service: Orthopedics;  Laterality: Left;   URETEROSCOPY WITH HOLMIUM LASER LITHOTRIPSY Bilateral 11/01/2016   Procedure: URETEROSCOPY WITH HOLMIUM LASER LITHOTRIPSY;  Surgeon: [name], MD;  Location: [facility];  Service: Urology;  Laterality: Bilateral;   URETEROSCOPY WITH HOLMIUM LASER LITHOTRIPSY Left 12/10/2016   Procedure: URETEROSCOPY WITH HOLMIUM LASER LITHOTRIPSY;  Surgeon: Abbie Sons, MD;  Location: [facility];  Service: Urology;  Laterality: Left;   URETEROSCOPY WITH HOLMIUM LASER LITHOTRIPSY Left 11/27/2016   Procedure: URETEROSCOPY WITH HOLMIUM LASER LITHOTRIPSY;  Surgeon: [name], MD;  Location: [facility];  Service: Urology;  Laterality: Left;   VITRECTOMY Bilateral 08/22/2021   Family History  Problem Relation Age of Onset   Benign prostatic hyperplasia  Father    Prostate cancer Father    Hypertension Mother    Kidney disease Neg Hx    Kidney cancer Neg Hx    Bladder Cancer Neg Hx    Social History   Tobacco Use   Smoking status: Former    Years: 2.00    Types: Cigarettes    Quit date: 06/18/1966    Years since quitting: 55.7    Passive exposure: Never   Smokeless tobacco: Never   Tobacco comments:    quit 40 years ago  Vaping Use   Vaping Use: Never used  Substance Use Topics   Alcohol use: Yes    Comment: rarely   Drug use: No    Pertinent Clinical Results:  LABS: Labs reviewed: Acceptable for surgery.   Ref Range & Units 07/25/2021  WBC 3.8 - 10.8 Thousand/uL 14.2 High    RBC 4.20 - 5.80 Million/uL 5.10   Hemoglobin 13.2 - 17.1 g/dL 16.3   Hematocrit 38.5 - 50.0 % 46.9   MCV 80.0 - 100.0 fL 92.0   MCH 27.0 - 33.0 pg 32.0   MCHC 32.0 - 36.0  g/dL 34.8   RDW 11.0 - 15.0 % 12.6   Platelets 140 - 400 Thousand/uL 230   MPV 7.5 - 12.5 fL 10.8   Neutrophils Absolute 1500 - 7800 cells/uL 10,977 High    Lymphocytes Absolute 850 - 3900 cells/uL 1,945   Monocytes Absolute 200 - 950 cells/uL 866   Eosinophils Absolute 15 - 500 cells/uL 355   Basophils Absolute 0 - 200 cells/uL 57   Neutrophils Relative % 77.3   Lymphocytes % 13.7   Monocytes % 6.1   Eosinophils % 2.5   Basophils Relative % 0.4   Resulting Agency  [facility]  Specimen Collected: 07/25/21 15:08   Performed by: Yvetta Coder Last Resulted: 07/26/21 10:08  Received From: [facility]  Result Received: 07/28/21 10:01     Ref Range & Units 12/04/2021  Glucose 70 - 110 mg/dL 107  Sodium 136 - 145 mmol/L 141  Potassium 3.6 - 5.1 mmol/L 5.1  Chloride 97 - 109 mmol/L 104  Carbon Dioxide (CO2) 22.0 - 32.0 mmol/L 30.2  Urea Nitrogen (BUN) 7 - 25 mg/dL 27 High   Creatinine 0.7 - 1.3 mg/dL 1.6 High   Glomerular Filtration Rate (eGFR) >60 mL/min/1.73sq m 46 Low   Calcium 8.7 - 10.3 mg/dL 9.3  AST 8 - 39 U/L 15  ALT 6 - 57 U/L 18  Alk Phos (alkaline  Phosphatase) 34 - 104 U/L 93  Albumin 3.5 - 4.8 g/dL 4.1  Bilirubin, Total 0.3 - 1.2 mg/dL 0.8  Protein, Total 6.1 - 7.9 g/dL 6.9  A/G Ratio 1.0 - 5.0 gm/dL 1.5  Resulting Agency  [facility]   Specimen Collected: 12/04/21 11:23   Performed by: [place]: 12/04/21 16:03  Received From: [facility]  Result Received: 02/22/22 11:23    ECG: Date: 10/09/2021 Time ECG obtained: 1006 AM Rate: 74 bpm Rhythm: Atrial-sensed ventricular-paced rhythm Axis (leads I and aVF): Normal Intervals: PR 160 ms. QRS 186 ms. QTc 519 ms. ST segment and T wave changes: No evidence of acute ST segment elevation or depression Comparison: Similar to previous tracing obtained on 03/08/2021 NOTE: Tracing obtained at [facility]; unable for review. Above based on cardiologist's interpretation.    IMAGING / PROCEDURES: TRANSTHORACIC ECHOCARDIOGRAM performed on 02/08/2020 Normal left ventricular systolic function with an EF of >55%  Mild LVH Normal left ventricular diastolic Doppler parameters Normal right ventricular systolic function Trivial MR and TR No AR or PR Normal transvalvular gradients; no valvular stenosis No pericardial effusion  MYOCARDIAL PERFUSION IMAGING STUDY (LEXISCAN) performed on 10/14/2016 Mildly decreased left ventricular systolic function with a LV EF of 45-55% Normal myocardial thickening and wall motion Left ventricular cavity size normal SPECT images demonstrate homogenous tracer distribution throughout the myocardium No evidence of stress-induced myocardial ischemia or arrhythmia Normal low risk study  Impression and Plan:  Richard Duncan has been referred for pre-anesthesia review and clearance prior to him undergoing the planned anesthetic and procedural courses. Available labs, pertinent testing, and imaging results were personally reviewed by me. This patient has been appropriately cleared by cardiology  with an overall LOW risk of significant perioperative cardiovascular complications. Completed perioperative prescription for cardiac device management documentation completed by primary cardiology team and placed on patient's chart for review by the surgical/anesthetic team on the day of his procedure. Electrophysiology indicating that procedure should not interfere with planned surgical procedure. Beyond normal perioperative cardiovascular monitoring, there are no recommendations from electrophysiology team that prompt  further discussion/recommendations from Medical sales representative.   Based on clinical review performed today (02/27/22), barring any significant acute changes in the patient's overall condition, it is anticipated that he will be able to proceed with the planned surgical intervention. Any acute changes in clinical condition may necessitate his procedure being postponed and/or cancelled. Patient will meet with anesthesia team (MD and/or CRNA) on the day of his procedure for preoperative evaluation/assessment. Questions regarding anesthetic course will be fielded at that time.   Pre-surgical instructions were reviewed with the patient during his PAT appointment and questions were fielded by PAT clinical staff. Patient was advised that if any questions or concerns arise prior to his procedure then he should return a call to PAT and/or his surgeon's office to discuss.  Honor Loh, MSN, APRN, FNP-C, CEN [facility]  Peri-operative Services Nurse Practitioner Phone: [phone] Fax: [phone] 02/27/22 11:06 AM  NOTE: This note has been prepared using Dragon dictation software. Despite my best ability to proofread, there is always the potential that unintentional transcriptional errors may still occur from this process.

## 2022-03-04 ENCOUNTER — Other Ambulatory Visit: Payer: Self-pay

## 2022-03-04 MED ORDER — TADALAFIL 10 MG PO TABS: 10.0000 mg | ORAL_TABLET | Freq: Every day | ORAL | 0 refills | Status: DC | PRN

## 2022-03-04 NOTE — Telephone Encounter (Signed)
Patient called asking to have Tadalafil refilled to [facility] instead of [facility] and asked for 90 day supply because it will be cheaper for him.

## 2022-03-06 ENCOUNTER — Ambulatory Visit: Payer: Medicare HMO | Admitting: Urgent Care

## 2022-03-06 ENCOUNTER — Other Ambulatory Visit: Payer: Self-pay

## 2022-03-06 ENCOUNTER — Ambulatory Visit
Admission: RE | Admit: 2022-03-06 | Discharge: 2022-03-06 | Disposition: A | Discharge status: Home / Self Care | Payer: Medicare HMO | Attending: Surgery | Admitting: Surgery

## 2022-03-06 ENCOUNTER — Encounter: Payer: Self-pay | Admitting: Surgery

## 2022-03-06 ENCOUNTER — Encounter
Admission: RE | Disposition: A | Discharge status: Home / Self Care | Payer: Self-pay | Source: Home / Self Care | Attending: Surgery

## 2022-03-06 DIAGNOSIS — N183 Chronic kidney disease, stage 3 unspecified: Secondary | ICD-10-CM | POA: Diagnosis not present

## 2022-03-06 DIAGNOSIS — I2510 Atherosclerotic heart disease of native coronary artery without angina pectoris: Secondary | ICD-10-CM | POA: Diagnosis not present

## 2022-03-06 DIAGNOSIS — F419 Anxiety disorder, unspecified: Secondary | ICD-10-CM | POA: Diagnosis not present

## 2022-03-06 DIAGNOSIS — Z9681 Presence of artificial skin: Secondary | ICD-10-CM | POA: Diagnosis not present

## 2022-03-06 DIAGNOSIS — K219 Gastro-esophageal reflux disease without esophagitis: Secondary | ICD-10-CM | POA: Diagnosis not present

## 2022-03-06 DIAGNOSIS — I129 Hypertensive chronic kidney disease with stage 1 through stage 4 chronic kidney disease, or unspecified chronic kidney disease: Secondary | ICD-10-CM | POA: Diagnosis not present

## 2022-03-06 DIAGNOSIS — Z01812 Encounter for preprocedural laboratory examination: Secondary | ICD-10-CM

## 2022-03-06 DIAGNOSIS — J449 Chronic obstructive pulmonary disease, unspecified: Secondary | ICD-10-CM | POA: Diagnosis not present

## 2022-03-06 DIAGNOSIS — Z87891 Personal history of nicotine dependence: Secondary | ICD-10-CM | POA: Insufficient documentation

## 2022-03-06 DIAGNOSIS — G894 Chronic pain syndrome: Secondary | ICD-10-CM | POA: Diagnosis not present

## 2022-03-06 DIAGNOSIS — F32A Depression, unspecified: Secondary | ICD-10-CM | POA: Insufficient documentation

## 2022-03-06 DIAGNOSIS — Z8546 Personal history of malignant neoplasm of prostate: Secondary | ICD-10-CM | POA: Insufficient documentation

## 2022-03-06 DIAGNOSIS — Z79899 Other long term (current) drug therapy: Secondary | ICD-10-CM | POA: Diagnosis not present

## 2022-03-06 DIAGNOSIS — G5601 Carpal tunnel syndrome, right upper limb: Secondary | ICD-10-CM | POA: Diagnosis present

## 2022-03-06 DIAGNOSIS — Z7951 Long term (current) use of inhaled steroids: Secondary | ICD-10-CM | POA: Insufficient documentation

## 2022-03-06 DIAGNOSIS — G4730 Sleep apnea, unspecified: Secondary | ICD-10-CM | POA: Diagnosis not present

## 2022-03-06 DIAGNOSIS — Z6841 Body Mass Index (BMI) 40.0 and over, adult: Secondary | ICD-10-CM | POA: Diagnosis not present

## 2022-03-06 DIAGNOSIS — G709 Myoneural disorder, unspecified: Secondary | ICD-10-CM | POA: Insufficient documentation

## 2022-03-06 DIAGNOSIS — Z950 Presence of cardiac pacemaker: Secondary | ICD-10-CM | POA: Diagnosis not present

## 2022-03-06 DIAGNOSIS — I442 Atrioventricular block, complete: Secondary | ICD-10-CM | POA: Diagnosis not present

## 2022-03-06 DIAGNOSIS — E785 Hyperlipidemia, unspecified: Secondary | ICD-10-CM | POA: Insufficient documentation

## 2022-03-06 DIAGNOSIS — M65311 Trigger thumb, right thumb: Secondary | ICD-10-CM | POA: Diagnosis not present

## 2022-03-06 HISTORY — PX: CARPAL TUNNEL RELEASE: SHX101

## 2022-03-06 HISTORY — PX: TRIGGER FINGER RELEASE: SHX641

## 2022-03-06 LAB — CBC
HCT: 40.6 % (ref 39.0–52.0)
Hemoglobin: 13.5 g/dL (ref 13.0–17.0)
MCH: 30.8 pg (ref 26.0–34.0)
MCHC: 33.3 g/dL (ref 30.0–36.0)
MCV: 92.7 fL (ref 80.0–100.0)
Platelets: 171 10*3/uL (ref 150–400)
RBC: 4.38 MIL/uL (ref 4.22–5.81)
RDW: 12.8 % (ref 11.5–15.5)
WBC: 10.9 10*3/uL — ABNORMAL HIGH (ref 4.0–10.5)
nRBC: 0 % (ref 0.0–0.2)

## 2022-03-06 SURGERY — RELEASE, CARPAL TUNNEL, ENDOSCOPIC
Anesthesia: General | Site: Wrist | Laterality: Right

## 2022-03-06 MED ORDER — FENTANYL CITRATE (PF) 100 MCG/2ML IJ SOLN
INTRAMUSCULAR | Status: DC | PRN
  Administered 2022-03-06 (×2): 50 ug via INTRAVENOUS

## 2022-03-06 MED ORDER — BUPIVACAINE HCL (PF) 0.5 % IJ SOLN
INTRAMUSCULAR | Status: AC
  Filled 2022-03-06: qty 30

## 2022-03-06 MED ORDER — ONDANSETRON HCL 4 MG/2ML IJ SOLN: 4.0000 mg | Freq: Four times a day (QID) | INTRAMUSCULAR | Status: DC | PRN

## 2022-03-06 MED ORDER — DEXAMETHASONE SODIUM PHOSPHATE 10 MG/ML IJ SOLN
INTRAMUSCULAR | Status: DC | PRN
  Administered 2022-03-06: 10 mg via INTRAVENOUS

## 2022-03-06 MED ORDER — METOCLOPRAMIDE HCL 5 MG/ML IJ SOLN: 5.0000 mg | Freq: Three times a day (TID) | INTRAMUSCULAR | Status: DC | PRN

## 2022-03-06 MED ORDER — MIDAZOLAM HCL 2 MG/2ML IJ SOLN
INTRAMUSCULAR | Status: DC | PRN
  Administered 2022-03-06: 2 mg via INTRAVENOUS

## 2022-03-06 MED ORDER — KETAMINE HCL 10 MG/ML IJ SOLN
INTRAMUSCULAR | Status: DC | PRN
  Administered 2022-03-06: 10 mg via INTRAVENOUS
  Administered 2022-03-06 (×2): 20 mg via INTRAVENOUS

## 2022-03-06 MED ORDER — LACTATED RINGERS IV SOLN: INTRAVENOUS | Status: DC

## 2022-03-06 MED ORDER — PROPOFOL 10 MG/ML IV BOLUS
INTRAVENOUS | Status: DC | PRN
  Administered 2022-03-06: 200 mg via INTRAVENOUS
  Administered 2022-03-06: 30 mg via INTRAVENOUS

## 2022-03-06 MED ORDER — 0.9 % SODIUM CHLORIDE (POUR BTL) OPTIME
TOPICAL | Status: DC | PRN
  Administered 2022-03-06: 500 mL

## 2022-03-06 MED ORDER — FENTANYL CITRATE (PF) 100 MCG/2ML IJ SOLN: 25.0000 ug | INTRAMUSCULAR | Status: DC | PRN

## 2022-03-06 MED ORDER — ORAL CARE MOUTH RINSE: 15.0000 mL | Freq: Once | OROMUCOSAL | Status: AC

## 2022-03-06 MED ORDER — CHLORHEXIDINE GLUCONATE 0.12 % MT SOLN: 15.0000 mL | Freq: Once | OROMUCOSAL | Status: AC

## 2022-03-06 MED ORDER — SEVOFLURANE IN SOLN
RESPIRATORY_TRACT | Status: AC
  Filled 2022-03-06: qty 250

## 2022-03-06 MED ORDER — DEXTROSE 5 % IV SOLN
INTRAVENOUS | Status: DC | PRN
  Administered 2022-03-06: 3 g via INTRAVENOUS

## 2022-03-06 MED ORDER — METOCLOPRAMIDE HCL 10 MG PO TABS: 5.0000 mg | ORAL_TABLET | Freq: Three times a day (TID) | ORAL | Status: DC | PRN

## 2022-03-06 MED ORDER — CEFAZOLIN IN SODIUM CHLORIDE 3-0.9 GM/100ML-% IV SOLN
3.0000 g | INTRAVENOUS | Status: DC
  Filled 2022-03-06: qty 100

## 2022-03-06 MED ORDER — EPHEDRINE SULFATE (PRESSORS) 50 MG/ML IJ SOLN
INTRAMUSCULAR | Status: DC | PRN
  Administered 2022-03-06 (×2): 5 mg via INTRAVENOUS

## 2022-03-06 MED ORDER — FENTANYL CITRATE (PF) 100 MCG/2ML IJ SOLN
INTRAMUSCULAR | Status: AC
  Filled 2022-03-06: qty 2

## 2022-03-06 MED ORDER — LACTATED RINGERS IV SOLN: INTRAVENOUS | Status: DC | PRN

## 2022-03-06 MED ORDER — ONDANSETRON HCL 4 MG PO TABS: 4.0000 mg | ORAL_TABLET | Freq: Four times a day (QID) | ORAL | Status: DC | PRN

## 2022-03-06 MED ORDER — PROPOFOL 1000 MG/100ML IV EMUL
INTRAVENOUS | Status: AC
  Filled 2022-03-06: qty 100

## 2022-03-06 MED ORDER — DEXMEDETOMIDINE HCL IN NACL 80 MCG/20ML IV SOLN
INTRAVENOUS | Status: DC | PRN
  Administered 2022-03-06: 16 ug via BUCCAL
  Administered 2022-03-06: 4 ug via BUCCAL
  Administered 2022-03-06: 12 ug via BUCCAL

## 2022-03-06 MED ORDER — SODIUM CHLORIDE 0.9 % IV SOLN: INTRAVENOUS | Status: DC

## 2022-03-06 MED ORDER — BUPIVACAINE HCL (PF) 0.5 % IJ SOLN
INTRAMUSCULAR | Status: DC | PRN
  Administered 2022-03-06: 10 mL

## 2022-03-06 MED ORDER — ACETAMINOPHEN 10 MG/ML IV SOLN
INTRAVENOUS | Status: DC | PRN
  Administered 2022-03-06: 1000 mg via INTRAVENOUS

## 2022-03-06 MED ORDER — ONDANSETRON HCL 4 MG/2ML IJ SOLN: 4.0000 mg | Freq: Once | INTRAMUSCULAR | Status: DC | PRN

## 2022-03-06 MED ORDER — ACETAMINOPHEN 10 MG/ML IV SOLN
INTRAVENOUS | Status: AC
  Filled 2022-03-06: qty 100

## 2022-03-06 MED ORDER — LIDOCAINE HCL (CARDIAC) PF 100 MG/5ML IV SOSY
PREFILLED_SYRINGE | INTRAVENOUS | Status: DC | PRN
  Administered 2022-03-06: 100 mg via INTRAVENOUS

## 2022-03-06 MED ORDER — GLYCOPYRROLATE 0.2 MG/ML IJ SOLN
INTRAMUSCULAR | Status: DC | PRN
  Administered 2022-03-06: .2 mg via INTRAVENOUS

## 2022-03-06 MED ORDER — PHENYLEPHRINE 80 MCG/ML (10ML) SYRINGE FOR IV PUSH (FOR BLOOD PRESSURE SUPPORT)
PREFILLED_SYRINGE | INTRAVENOUS | Status: DC | PRN
  Administered 2022-03-06 (×2): 80 ug via INTRAVENOUS

## 2022-03-06 MED ORDER — ONDANSETRON HCL 4 MG/2ML IJ SOLN
INTRAMUSCULAR | Status: DC | PRN
  Administered 2022-03-06: 4 mg via INTRAVENOUS

## 2022-03-06 MED ORDER — KETAMINE HCL 50 MG/5ML IJ SOSY
PREFILLED_SYRINGE | INTRAMUSCULAR | Status: AC
  Filled 2022-03-06: qty 5

## 2022-03-06 MED ORDER — CHLORHEXIDINE GLUCONATE 0.12 % MT SOLN
OROMUCOSAL | Status: AC
  Administered 2022-03-06: 15 mL via OROMUCOSAL
  Filled 2022-03-06: qty 15

## 2022-03-06 MED ORDER — MIDAZOLAM HCL 2 MG/2ML IJ SOLN
INTRAMUSCULAR | Status: AC
  Filled 2022-03-06: qty 2

## 2022-03-06 MED ORDER — PROPOFOL 10 MG/ML IV BOLUS
INTRAVENOUS | Status: AC
  Filled 2022-03-06: qty 20

## 2022-03-06 SURGICAL SUPPLY — 36 items
BNDG COHESIVE 4X5 TAN STRL LF (GAUZE/BANDAGES/DRESSINGS) ×2 IMPLANT
BNDG ELASTIC 2X5.8 VLCR NS LF (GAUZE/BANDAGES/DRESSINGS) ×2 IMPLANT
BNDG ELASTIC 2X5.8 VLCR STR LF (GAUZE/BANDAGES/DRESSINGS) ×2 IMPLANT
BNDG ESMARCH 4 X 12 STRL LF (GAUZE/BANDAGES/DRESSINGS) ×2
BNDG ESMARCH 4X12 STRL LF (GAUZE/BANDAGES/DRESSINGS) ×2 IMPLANT
CHLORAPREP W/TINT 26 (MISCELLANEOUS) ×2 IMPLANT
CORD BIP STRL DISP 12FT (MISCELLANEOUS) ×2 IMPLANT
CUFF TOURN SGL QUICK 18X4 (TOURNIQUET CUFF) ×2 IMPLANT
DRAPE SURG 17X11 SM STRL (DRAPES) ×4 IMPLANT
FORCEPS JEWEL BIP 4-3/4 STR (INSTRUMENTS) ×2 IMPLANT
GAUZE SPONGE 4X4 12PLY STRL (GAUZE/BANDAGES/DRESSINGS) ×2 IMPLANT
GAUZE XEROFORM 1X8 LF (GAUZE/BANDAGES/DRESSINGS) ×2 IMPLANT
GLOVE BIO SURGEON STRL SZ8 (GLOVE) ×4 IMPLANT
GLOVE SURG UNDER LTX SZ8 (GLOVE) ×2 IMPLANT
GOWN STRL REUS W/ TWL LRG LVL3 (GOWN DISPOSABLE) ×2 IMPLANT
GOWN STRL REUS W/ TWL XL LVL3 (GOWN DISPOSABLE) ×2 IMPLANT
GOWN STRL REUS W/TWL LRG LVL3 (GOWN DISPOSABLE) ×2
GOWN STRL REUS W/TWL XL LVL3 (GOWN DISPOSABLE) ×2
KIT CARPAL TUNNEL (MISCELLANEOUS) ×2
KIT ESCP INSRT D SLOT CANN KN (MISCELLANEOUS) ×2 IMPLANT
KIT TURNOVER KIT A (KITS) ×2 IMPLANT
MANIFOLD NEPTUNE II (INSTRUMENTS) ×2 IMPLANT
NDL HYPO 25X1 1.5 SAFETY (NEEDLE) ×2 IMPLANT
NEEDLE HYPO 25X1 1.5 SAFETY (NEEDLE) ×2 IMPLANT
NS IRRIG 500ML POUR BTL (IV SOLUTION) ×2 IMPLANT
PACK EXTREMITY ARMC (MISCELLANEOUS) ×2 IMPLANT
SPLINT WRIST LG LT TX990309 (SOFTGOODS) IMPLANT
SPLINT WRIST LG RT TX900304 (SOFTGOODS) IMPLANT
SPLINT WRIST M LT TX990308 (SOFTGOODS) IMPLANT
SPLINT WRIST M RT TX990303 (SOFTGOODS) IMPLANT
SPLINT WRIST XL LT TX990310 (SOFTGOODS) IMPLANT
SPLINT WRIST XL RT TX990305 (SOFTGOODS) IMPLANT
STOCKINETTE IMPERVIOUS 9X36 MD (GAUZE/BANDAGES/DRESSINGS) ×2 IMPLANT
SUT PROLENE 4 0 PS 2 18 (SUTURE) ×2 IMPLANT
TRAP FLUID SMOKE EVACUATOR (MISCELLANEOUS) ×2 IMPLANT
WATER STERILE IRR 500ML POUR (IV SOLUTION) ×2 IMPLANT

## 2022-03-06 NOTE — Transfer of Care (Signed)
Immediate Anesthesia Transfer of Care Note  Patient: Richard Duncan  Procedure(s) Performed: CARPAL TUNNEL RELEASE ENDOSCOPIC (Right: Wrist) RELEASE TRIGGER FINGER/A-1 PULLEY (Right: Thumb)  Patient Location: PACU  Anesthesia Type:General  Level of Consciousness: drowsy  Airway & Oxygen Therapy: Patient Spontanous Breathing and Patient connected to face mask oxygen  Post-op Assessment: Report given to RN and Post -op Vital signs reviewed and stable  Post vital signs: Reviewed and stable  Last Vitals:  Vitals Value Taken Time  BP 122/69 03/06/22 1122  Temp    Pulse 62 03/06/22 1126  Resp 12 03/06/22 1126  SpO2 95 % 03/06/22 1126  Vitals shown include unvalidated device data.  Last Pain:  Vitals:   03/06/22 0829  TempSrc: Tympanic  PainSc: 8          Complications: No notable events documented.

## 2022-03-06 NOTE — Discharge Instructions (Addendum)
Orthopedic discharge instructions: Keep dressing dry and intact. Keep hand elevated above heart level. May shower after dressing removed on postop day 4 (Sunday). Cover sutures with Band-Aids after drying off. Apply ice to affected area frequently. Take ibuprofen 800 mg TID with meals for 3-5 days, then as necessary. Take ES Tylenol or pain medication as prescribed when needed.  Return for follow-up in 10-14 days or as scheduled.   AMBULATORY SURGERY  DISCHARGE INSTRUCTIONS   The drugs that you were given will stay in your system until tomorrow so for the next 24 hours you should not:  Drive an automobile Make any legal decisions Drink any alcoholic beverage   You may resume regular meals tomorrow.  Today it is better to start with liquids and gradually work up to solid foods.  You may eat anything you prefer, but it is better to start with liquids, then soup and crackers, and gradually work up to solid foods.   Please notify your doctor immediately if you have any unusual bleeding, trouble breathing, redness and pain at the surgery site, drainage, fever, or pain not relieved by medication.     Additional Instructions:

## 2022-03-06 NOTE — H&P (Signed)
History of Present Illness:  Richard Duncan is a 72 y.o. male who presents for history and physical for an upcoming right endoscopic carpal tunnel release with right thumb trigger finger release to be done by Dr. Roland Rack on March 06, 2022. The patient saw Dr. Roland Rack several months ago. The patient also returns for follow-up of his longstanding right hand and wrist pain and paresthesias, as well as recurrent painful catching of his right thumb. He has been diagnosed in the past with both right carpal tunnel syndrome as well as with a right trigger thumb. He notes that the symptoms have persisted and in fact may be slightly worse. He continues to have difficulty grasping or holding objects and occasionally is awakened at night due to the numbness and paresthesias to his hand. He is ready to proceed with surgical intervention on his right hand. The patient is not a diabetic.  Current Outpatient Medications: acetaminophen (TYLENOL) 500 MG tablet Take by mouth once as needed  albuterol 90 mcg/actuation inhaler Inhale 2 inhalations into the lungs every 6 (six) hours as needed for Wheezing 1 each 6  amLODIPine (NORVASC) 10 MG tablet Take 1 tablet (10 mg total) by mouth once daily 90 tablet 1  atorvastatin (LIPITOR) 20 MG tablet Take 1 tablet (20 mg total) by mouth once daily 90 tablet 1  benazepriL (LOTENSIN) 40 MG tablet TAKE 1 TABLET BY MOUTH EVERY DAY 90 tablet 3  buprenorphine-naloxone (SUBOXONE) 2-0.5 mg SL film PLACE 1 FILM (2 MG OF BUPRENORPHINE TOTAL) UNDER THE TONGUE TWO (2) TIMES A DAY. DNF 03/29/19  chlorthalidone 25 MG tablet Take 1 tablet (25 mg total) by mouth once daily 90 tablet 1  cholecalciferol (VITAMIN D3) 2,000 unit capsule Take 2,000 Units by mouth once daily.  citalopram (CELEXA) 40 MG tablet TAKE 1 TABLET BY MOUTH EVERY DAY 90 tablet 3  cyanocobalamin (VITAMIN B12) 1000 MCG tablet Take by mouth  fluticasone furoate-vilanteroL (BREO ELLIPTA) 100-25 mcg/dose DsDv inhaler Inhale 1 Puff  into the lungs once daily 3 each 1  fluticasone propionate (FLONASE) 50 mcg/actuation nasal spray Place 2 sprays into one nostril 2 (two) times daily  methocarbamoL (ROBAXIN) 500 MG tablet TAKE 1 TABLET (500 MG TOTAL) BY MOUTH TWO (2) TIMES A DAY AS NEEDED (MUSCLE PAIN/SPASMS).  metoprolol tartrate (LOPRESSOR) 50 MG tablet Take 1 tablet (50 mg total) by mouth 2 (two) times daily 180 tablet 3  pantoprazole (PROTONIX) 40 MG DR tablet TAKE 1 TABLET BY MOUTH EVERY DAY 90 tablet 3  potassium citrate (UROCIT-K) 15 mEq ER tablet  tadalafiL (CIALIS) 10 MG tablet Take 10 mg by mouth once daily as needed for Erectile Dysfunction   Allergies:  Propofol Other (URINARY RETENTION REQUIRING CATHETERIZATION)  Propranolol Other (Urinary retention)   Past Medical History:  Allergic rhinitis due to allergen  Allergy 2014  Anxiety  Arrhythmia 06/2013  Arthritis  Bronchitis, chronic (CMS-HCC)  Carpal tunnel syndrome  COPD (chronic obstructive pulmonary disease) (CMS-HCC) with CPAP  Depression  Encounter for blood transfusion 2014  GERD (on and off for quite a while)  Heart disease  History of cataract 2021 (Both eyes done)  Hyperlipidemia  Hypertension  Kidney disease  Obesity (Most of my life)  Osteoporosis  Pain medication agreement signed 08/26/2017  [facility] ANES opioid agreement reviewed, signed and copy given to patient.  Pneumonia  Prostate cancer (CMS-HCC)  Sleep apnea   Past Surgical History:  REPLACEMENT TOTAL HIP W/ RESURFACING IMPLANTS Left 2004  Revision total hip 10/22/2012  COLONOSCOPY 12/18/2004 (  Adenomatous Polyps)  Revision of femoral head component. Attempted revision of femoral stem and acetabular 07/22/2013  COLONOSCOPY 01/24/2014 ([place] Adenomatous Polyps: CBF 12/2018)  Excision of Lipoma 03/08/2016 (Dr Rochel Brome --- 2 abdominal area)  COLONOSCOPY 06/25/2019 (Dr Chuck Hint- polyps removed from the transverse colon and rectosigmoid colon- most of which were adenomas.3y repeat)   REVISION/RELOCATION PACEMAKER POCKET 2022  revision of pacemaker- [place]  1. Endoscopic left carpal tunnel release. 2. Release left trigger thumb Left 10/17/2021 (Dr.Josaiah Muhammed)  [place] 1988, 2000 (laminectomy lumbar spine)  BRONCHOSCOPY  CATARACT EXTRACTION 2021  Fusion of spine 2000s (cervical region)  JOINT REPLACEMENT  PROSTATE SURGERY 2020  TONSILLECTOMY   Family History:  Arthritis Mother  Osteoporosis (Thinning of bones) Mother  High blood pressure (Hypertension) Mother  Alzheimer's disease Mother  Osteoarthritis Mother  Arthritis Father  Prostate cancer Father  Diabetes Father  HIV Brother  AIDS  Arthritis Maternal Grandmother  No Known Problems Daughter  No Known Problems Daughter   Social History:   Socioeconomic History:  Marital status: Married  Tobacco Use  Smoking status: Former  Packs/day: 2.00  Years: 2.00  Additional pack years: 0.00  Total pack years: 4.00  Types: Cigarettes, Pipe, Cigars  Start date: 06/21/1965  Quit date: 07/14/1967  Years since quitting: 73.6  Smokeless tobacco: Never  Vaping Use  Vaping Use: Never used  Substance and Sexual Activity  Alcohol use: Not Currently  Comment: Very rarely.  Drug use: No  Sexual activity: Yes  Partners: Female  Birth control/protection: Post-menopausal   Review of Systems:  A comprehensive 14 point ROS was performed, reviewed, and the pertinent orthopaedic findings are documented in the HPI.  Physical Exam: Vitals:  02/28/22 0935  BP: 112/76  Weight: (!) 151.4 kg (333 lb 12.8 oz)  Height: 185.4 cm ('6\' 1"'$ )  PainSc: 8  PainLoc: Wrist   General/Constitutional: Pleasant significantly overweight elderly male in no acute distress. Neuro/Psych: Normal mood and affect, oriented to person, place and time. Eyes: Non-icteric. Pupils are equal, round, and reactive to light, and exhibit synchronous movement. ENT: Unremarkable. Lymphatic: No palpable  adenopathy. Respiratory: Lungs clear to auscultation, Normal chest excursion, No wheezes, and Non-labored breathing Cardiovascular: Regular rate and rhythm. No murmurs. and No edema, swelling or tenderness, except as noted in detailed exam. Integumentary: No impressive skin lesions present, except as noted in detailed exam. Musculoskeletal: Unremarkable, except as noted in detailed exam.  Heart: Examination of the heart reveals regular, rate, and rhythm. There is no murmur noted on ascultation. There is a normal apical pulse.  Lungs: Lungs are clear to auscultation. There is no wheeze, rhonchi, or crackles. There is normal expansion of bilateral chest walls.   Right wrist/hand exam: On inspection, mild thenar atrophy again is observed, but otherwise his skin inspection is unremarkable. No swelling, erythema, ecchymosis, abrasions, or other skin abnormalities are identified. He demonstrates full active and passive range of motion of the right wrist without any pain or catching. He again can actively flex and extend all digits fully with intermittent mild "catching" of the thumb during active flexion and extension of the thumb. In addition, he experiences mild tenderness to palpation over the volar aspect of the thumb at the level of the MCP joint. He remains neurovascularly intact to all digits. He exhibits an equivocally positive Phalen's test, but has a negative Tinel's sign over the carpal tunnel.  X-rays/MRI/Lab data:  AP, lateral, and oblique views of the left wrist/hand are obtained. These films demonstrate  moderate to severe degenerative changes of the left thumb CMC joint as manifest by joint space narrowing and osteophyte formation with some radial subluxation of the thumb metacarpal. No fractures or lytic lesions are identified.  Assessment: 1. Carpal tunnel syndrome, right.  2. Trigger finger of right thumb.  3. Class 3 severe obesity due to excess calories with serious comorbidity and  body mass index (BMI) of 45.0 to 49.9 in adult (CMS-HCC).   Plan: The treatment options were discussed with the patient. In addition, patient educational materials were provided regarding the diagnosis and treatment options. Regarding the patient's right wrist/hand symptoms and associated right trigger thumb, the patient is quite frustrated by the symptoms and is ready to consider more aggressive treatment options. Therefore, I have recommended a surgical procedure, specifically an endoscopic right carpal tunnel release with release of the right trigger thumb. The procedure was discussed with the patient, as were the potential risks (including bleeding, infection, nerve and/or blood vessel injury, persistent or recurrent pain/paresthesias, weakness of grip, recurrence of triggering,, need for further surgery, blood clots, strokes, heart attacks and/or arhythmias, pneumonia, etc.) and benefits. The patient states his understanding and wishes to proceed. All of the patient's questions and concerns were answered. He can call any time with further concerns.    H&P reviewed and patient re-examined. No changes.

## 2022-03-06 NOTE — Op Note (Signed)
03/06/2022  11:48 AM  Patient:   Richard Duncan  Pre-Op Diagnosis:   1.  Right carpal tunnel syndrome.  2.  Right trigger thumb.  Post-Op Diagnosis:   Same.  Procedure:   1.  Endoscopic right carpal tunnel release.  2.  Release of right trigger thumb.  Surgeon:   Pascal Lux, MD  Anesthesia:   General LMA  Findings:   As above.  Complications:   None  EBL:   0 cc  Fluids:   900 cc crystalloid  TT:   29 minutes at 250 mmHg  Drains:   None  Closure:   4-0 Prolene interrupted sutures  Brief Clinical Note:   The patient is a 72 year old male with a long history of progressively worsening pain and paresthesias to his right hand. His symptoms have progressed by medications, activity modification, etc. His history and examination are consistent with carpal tunnel syndrome. The patient presents at this time for an endoscopic right carpal tunnel release.  The patient also notes recurrent painful catching of his right thumb. His history and examination consistent with a right trigger thumb. The patient presents at this time for release of the right trigger thumb as well.  Procedure:   The patient was brought into the operating room and lain in the supine position. After adequate general laryngeal mask anesthesia was obtained, the right hand and upper extremity were prepped with ChloraPrep solution before being draped sterilely. Preoperative antibiotics were administered. A timeout was performed to verify the appropriate surgical site before the limb was exsanguinated with an Esmarch and the tourniquet inflated to 250 mmHg.   An approximately 1.5-2 cm incision was made over the volar wrist flexion crease, centered over the palmaris longus tendon. The incision was carried down through the subcutaneous tissues with care taken to identify and protect any neurovascular structures. The distal forearm fascia was penetrated just proximal to the transverse carpal ligament. The soft tissues were  released off the superficial and deep surfaces of the distal forearm fascia and this was released proximally for 3-4 cm under direct visualization.  Attention was directed distally. The Soil scientist was passed beneath the transverse carpal ligament along the ulnar aspect of the carpal tunnel and used to release any adhesions as well as to remove any adherent synovial tissue before first the smaller then the larger of the two dilators were passed beneath the transverse carpal ligament along the ulnar margin of the carpal tunnel. The slotted cannula was introduced and the endoscope was placed into the slotted cannula and the undersurface of the transverse carpal ligament visualized. The distal margin of the transverse carpal ligament was marked by placing a 25-gauge needle percutaneously at Medley cardinal point so that it entered the distal portion of the slotted cannula. Under endoscopic visualization, the transverse carpal ligament was released from proximal to distal using the end-cutting blade. A second pass was performed to ensure complete release of the ligament. The adequacy of release was verified both endoscopically and by palpation using the freer elevator.  Next, the right trigger thumb was addressed. An approximately 1.5-2.0 cm incision was made over the volar aspect of the thumb at the level of the metacarpal head centered over the flexor sheath. The incision was carried down through the subcutaneous tissues with care taken to identify and protect the digital neurovascular structures. The flexor sheath was entered just proximal to the A1 pulley. The sheath was released proximally for several centimeters under direct visualization. Distally, a clamp  was placed beneath the A1 pulley and used to release any adhesions. The clamp was repositioned so that one jaw was superficial to and the other jaw deep to the A1 pulley. The A1 pulley was incised on either side of the clamp to remove a 2 mm strip of  tissue. Metzenbaum scissors were used to ensure complete release of the A1 pulley more distally. The underlying tendon was carefully inspected and found to be intact.  Each wound was irrigated thoroughly with sterile saline solution before being closed using 4-0 Prolene interrupted sutures. A total of 10 cc of 0.5% plain Sensorcaine was injected in and around the two incisions before a sterile bulky dressing was applied to the wounds. The patient was placed into a volar wrist splint before being awakened, extubated, and returned to the recovery room in satisfactory condition after tolerating the procedure well.

## 2022-03-06 NOTE — Anesthesia Preprocedure Evaluation (Signed)
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 Interrogated in June 2023   TTE 02/08/2020: EF >55%, mild LVH, triv MR/TR   Neuro/Psych neg Seizures PSYCHIATRIC DISORDERS Anxiety Depression    Bilateral carpal tunnel syndrome  s/p ACDF C5-C6; resulting/residual postoperative neck "stiffness"  Chronic Pain Syndrome/DDD/Cervicalgia   Neuromuscular disease    GI/Hepatic Neg liver ROS, hiatal hernia,GERD  Controlled,,  Endo/Other    Morbid obesity  Renal/GU Renal InsufficiencyRenal disease     Musculoskeletal  (+) Arthritis ,  s/p lumbar laminectomy x 2   Abdominal   Peds  Hematology negative hematology ROS (+) Blood dyscrasia, anemia   Anesthesia Other Findings Urinary retention with PROPRANOLOL and PROPOFOL; has required urinary catheterization in the past  Spinal cord stimulator - Battery Died   Reproductive/Obstetrics negative OB ROS                             Anesthesia Physical Anesthesia Plan  ASA: III  Anesthesia Plan: General   Post-op Pain Management:    Induction: Intravenous  PONV Risk Score and Plan: Ondansetron, Dexamethasone and Treatment may vary due to age or medical condition  Airway Management Planned: LMA and Oral ETT  Additional Equipment:   Intra-op Plan:   Post-operative Plan: Extubation in OR  Informed Consent: I have reviewed the patients History and Physical, chart, labs and discussed the procedure including the risks, benefits and alternatives for the proposed anesthesia with the patient or authorized representative who has indicated his/her understanding and acceptance.     Dental Advisory Given  Plan Discussed with: CRNA  Anesthesia Plan Comments:         Anesthesia Quick Evaluation

## 2022-03-07 ENCOUNTER — Encounter: Payer: Self-pay | Admitting: Surgery

## 2022-03-11 NOTE — Anesthesia Postprocedure Evaluation (Signed)
Anesthesia Post Note  Patient: Shams Dozois Mccammon  Procedure(s) Performed: CARPAL TUNNEL RELEASE ENDOSCOPIC (Right: Wrist) RELEASE TRIGGER FINGER/A-1 PULLEY (Right: Thumb)  Patient location during evaluation: PACU Anesthesia Type: General Level of consciousness: awake and alert Pain management: pain level controlled Vital Signs Assessment: post-procedure vital signs reviewed and stable Respiratory status: spontaneous breathing, nonlabored ventilation, respiratory function stable and patient connected to nasal cannula oxygen Cardiovascular status: blood pressure returned to baseline and stable Postop Assessment: no apparent nausea or vomiting Anesthetic complications: no   No notable events documented.   Last Vitals:  Vitals:   03/06/22 1200 03/06/22 1213  BP: (!) 145/85 129/75  Pulse: 67 86  Resp: 16 18  Temp: (!) 36.4 C (!) 36.3 C  SpO2: 94% 93%    Last Pain:  Vitals:   03/06/22 1213  TempSrc: Temporal  PainSc: 0-No pain                 Martha Clan

## 2022-03-23 ENCOUNTER — Other Ambulatory Visit: Payer: Self-pay | Admitting: Urology

## 2022-03-23 DIAGNOSIS — N200 Calculus of kidney: Secondary | ICD-10-CM

## 2022-03-26 ENCOUNTER — Ambulatory Visit: Payer: Medicare HMO | Admitting: Urology

## 2022-03-26 ENCOUNTER — Other Ambulatory Visit
Admission: RE | Admit: 2022-03-26 | Discharge: 2022-03-26 | Disposition: A | Discharge status: Home / Self Care | Payer: Medicare HMO | Attending: Urology | Admitting: Urology

## 2022-03-26 ENCOUNTER — Encounter: Payer: Self-pay | Admitting: Urology

## 2022-03-26 VITALS — BP 126/70 | HR 87 | Ht 73.0 in | Wt 334.0 lb

## 2022-03-26 DIAGNOSIS — N529 Male erectile dysfunction, unspecified: Secondary | ICD-10-CM | POA: Diagnosis not present

## 2022-03-26 DIAGNOSIS — C61 Malignant neoplasm of prostate: Secondary | ICD-10-CM | POA: Diagnosis not present

## 2022-03-26 DIAGNOSIS — N200 Calculus of kidney: Secondary | ICD-10-CM

## 2022-03-26 DIAGNOSIS — Z87442 Personal history of urinary calculi: Secondary | ICD-10-CM | POA: Diagnosis not present

## 2022-03-26 DIAGNOSIS — N189 Chronic kidney disease, unspecified: Secondary | ICD-10-CM

## 2022-03-26 LAB — PSA: Prostatic Specific Antigen: 1.74 ng/mL (ref 0.00–4.00)

## 2022-03-26 MED ORDER — POTASSIUM CITRATE ER 15 MEQ (1620 MG) PO TBCR: 2.0000 | EXTENDED_RELEASE_TABLET | Freq: Two times a day (BID) | ORAL | 3 refills | Status: DC

## 2022-03-26 MED ORDER — TADALAFIL 10 MG PO TABS: 10.0000 mg | ORAL_TABLET | Freq: Every day | ORAL | 5 refills | Status: DC | PRN

## 2022-03-26 NOTE — Progress Notes (Signed)
   03/26/2022 8:50 AM   FARAH SCHIRTZINGER 1950/06/18 [id number]  Reason for visit: Follow up low risk prostate cancer, BPH status post HOLEP, nephrolithiasis, ED, CKD  HPI: I saw [name] in urology clinic for the above issues.  He is a 72 year old male with morbid obesity and BMI of 44, history of very low risk prostate cancer diagnosed in 2010 with a PSA of 4 and follow-up biopsy in 2014 that confirmed very low risk disease, and PSA has been stable since that time.  He had urodynamics that confirmed severe bladder outlet obstruction and elevated PVRs, and he ultimately elected to undergo HOLEP for management of his urinary symptoms.  He underwent an uncomplicated HOLEP on [id number] with removal of a large median lobe, pathology showed 31 g of benign tissue. Most recent PSA is 1.57 from February 2023, and stable over the last few years.  He denies any incontinence and continues to void with a strong stream, PVRs have always been normal postop.  He also has a history of recurrent uric acid stones, and has had an excellent response to alkalinization therapy with potassium citrate with significant decrease in stone burden on follow-up CT.  He denies any stone episodes over the last year and would like to continue the potassium citrate.  We also discussed increasing fluid intake, low-salt diet, and avoiding high animal proteins.  I personally viewed and interpreted the renal ultrasound from July 2023 that shows no hydronephrosis, and no evidence of stones.  He denies any stone episodes in the last year.  We previously change to the Cialis to 10 mg daily with a 10 mg boost dose as needed.  This dosing is working well for him.  He is also noted improvement in the erections as he has lost weight, and he reports 50 pounds of weight loss since his last visit.  He he was also recently diagnosed with CKD.  Renal ultrasound with no hydronephrosis.  PSA today, call with results, continue yearly PSA with  history of low risk prostate cancer Continue potassium citrate, refilled Cialis refilled 10 mg daily, 10 mg boost as needed RTC 1 year symptom check, PSA, and PVR  Billey Co, MD  [facility] [address] [place], [place] [address] [phone]

## 2022-05-07 ENCOUNTER — Other Ambulatory Visit: Payer: Self-pay | Admitting: Orthopedic Surgery

## 2022-05-07 DIAGNOSIS — Z981 Arthrodesis status: Secondary | ICD-10-CM

## 2022-05-07 DIAGNOSIS — M5030 Other cervical disc degeneration, unspecified cervical region: Secondary | ICD-10-CM

## 2022-05-07 DIAGNOSIS — M542 Cervicalgia: Secondary | ICD-10-CM

## 2022-05-07 DIAGNOSIS — M5412 Radiculopathy, cervical region: Secondary | ICD-10-CM

## 2022-05-13 ENCOUNTER — Ambulatory Visit
Admission: RE | Admit: 2022-05-13 | Discharge: 2022-05-13 | Disposition: A | Discharge status: Home / Self Care | Payer: Medicare HMO | Source: Ambulatory Visit | Attending: Orthopedic Surgery | Admitting: Orthopedic Surgery

## 2022-05-13 DIAGNOSIS — M5030 Other cervical disc degeneration, unspecified cervical region: Secondary | ICD-10-CM

## 2022-05-13 DIAGNOSIS — M5412 Radiculopathy, cervical region: Secondary | ICD-10-CM

## 2022-05-13 DIAGNOSIS — M542 Cervicalgia: Secondary | ICD-10-CM

## 2022-05-13 DIAGNOSIS — Z981 Arthrodesis status: Secondary | ICD-10-CM

## 2022-05-24 ENCOUNTER — Other Ambulatory Visit: Payer: Self-pay | Admitting: Family Medicine

## 2022-05-24 ENCOUNTER — Ambulatory Visit
Admission: RE | Admit: 2022-05-24 | Discharge: 2022-05-24 | Disposition: A | Discharge status: Home / Self Care | Payer: Medicare HMO | Source: Ambulatory Visit | Attending: Family Medicine | Admitting: Family Medicine

## 2022-05-24 ENCOUNTER — Encounter: Payer: Self-pay | Admitting: Family Medicine

## 2022-05-24 DIAGNOSIS — M7989 Other specified soft tissue disorders: Secondary | ICD-10-CM | POA: Insufficient documentation

## 2022-05-24 DIAGNOSIS — L03115 Cellulitis of right lower limb: Secondary | ICD-10-CM

## 2022-05-24 DIAGNOSIS — M79661 Pain in right lower leg: Secondary | ICD-10-CM

## 2022-06-03 ENCOUNTER — Other Ambulatory Visit: Payer: Self-pay | Admitting: Physical Medicine & Rehabilitation

## 2022-06-03 DIAGNOSIS — M9931 Osseous stenosis of neural canal of cervical region: Secondary | ICD-10-CM

## 2022-06-03 DIAGNOSIS — M542 Cervicalgia: Secondary | ICD-10-CM

## 2022-06-18 ENCOUNTER — Ambulatory Visit
Admission: RE | Admit: 2022-06-18 | Discharge: 2022-06-18 | Disposition: A | Discharge status: Home / Self Care | Payer: Medicare HMO | Source: Ambulatory Visit | Attending: Physical Medicine & Rehabilitation | Admitting: Physical Medicine & Rehabilitation

## 2022-06-18 DIAGNOSIS — M9931 Osseous stenosis of neural canal of cervical region: Secondary | ICD-10-CM

## 2022-06-18 MED ORDER — IOPAMIDOL (ISOVUE-M 300) INJECTION 61%
1.0000 mL | Freq: Once | INTRAMUSCULAR | Status: AC | PRN
  Administered 2022-06-18: 1 mL via EPIDURAL

## 2022-06-18 MED ORDER — TRIAMCINOLONE ACETONIDE 40 MG/ML IJ SUSP (RADIOLOGY)
60.0000 mg | Freq: Once | INTRAMUSCULAR | Status: AC
  Administered 2022-06-18: 60 mg via EPIDURAL

## 2022-06-18 NOTE — Discharge Instructions (Signed)

## 2022-09-25 ENCOUNTER — Inpatient Hospital Stay: Payer: Medicare HMO

## 2022-09-25 ENCOUNTER — Inpatient Hospital Stay: Payer: Medicare HMO | Attending: Oncology | Admitting: Oncology

## 2022-09-25 ENCOUNTER — Encounter: Payer: Self-pay | Admitting: Oncology

## 2022-09-25 VITALS — BP 121/71 | HR 72 | Temp 98.2°F | Resp 18 | Wt 340.2 lb

## 2022-09-25 DIAGNOSIS — Z87891 Personal history of nicotine dependence: Secondary | ICD-10-CM | POA: Diagnosis not present

## 2022-09-25 DIAGNOSIS — D72828 Other elevated white blood cell count: Secondary | ICD-10-CM

## 2022-09-25 DIAGNOSIS — Z79899 Other long term (current) drug therapy: Secondary | ICD-10-CM | POA: Insufficient documentation

## 2022-09-25 DIAGNOSIS — D72829 Elevated white blood cell count, unspecified: Secondary | ICD-10-CM | POA: Diagnosis present

## 2022-09-25 DIAGNOSIS — E669 Obesity, unspecified: Secondary | ICD-10-CM | POA: Insufficient documentation

## 2022-09-25 LAB — CBC WITH DIFFERENTIAL/PLATELET
Abs Immature Granulocytes: 0.03 10*3/uL (ref 0.00–0.07)
Basophils Absolute: 0 10*3/uL (ref 0.0–0.1)
Basophils Relative: 0 %
Eosinophils Absolute: 0.3 10*3/uL (ref 0.0–0.5)
Eosinophils Relative: 3 %
HCT: 43 % (ref 39.0–52.0)
Hemoglobin: 14.4 g/dL (ref 13.0–17.0)
Immature Granulocytes: 0 %
Lymphocytes Relative: 15 %
Lymphs Abs: 1.5 10*3/uL (ref 0.7–4.0)
MCH: 30.3 pg (ref 26.0–34.0)
MCHC: 33.5 g/dL (ref 30.0–36.0)
MCV: 90.3 fL (ref 80.0–100.0)
Monocytes Absolute: 0.6 10*3/uL (ref 0.1–1.0)
Monocytes Relative: 6 %
Neutro Abs: 7.3 10*3/uL (ref 1.7–7.7)
Neutrophils Relative %: 76 %
Platelets: 165 10*3/uL (ref 150–400)
RBC: 4.76 MIL/uL (ref 4.22–5.81)
RDW: 13.4 % (ref 11.5–15.5)
WBC: 9.8 10*3/uL (ref 4.0–10.5)
nRBC: 0 % (ref 0.0–0.2)

## 2022-09-25 LAB — TECHNOLOGIST SMEAR REVIEW
Plt Morphology: NORMAL
RBC MORPHOLOGY: NORMAL
WBC MORPHOLOGY: NORMAL

## 2022-09-25 LAB — HEPATITIS PANEL, ACUTE
HCV Ab: NONREACTIVE
Hep A IgM: NONREACTIVE
Hep B C IgM: NONREACTIVE
Hepatitis B Surface Ag: NONREACTIVE

## 2022-09-25 LAB — HIV ANTIBODY (ROUTINE TESTING W REFLEX): HIV Screen 4th Generation wRfx: NONREACTIVE

## 2022-09-25 LAB — LACTATE DEHYDROGENASE: LDH: 115 U/L (ref 98–192)

## 2022-09-25 NOTE — Progress Notes (Signed)
Pt here for consult for leukocytosis

## 2022-09-25 NOTE — Assessment & Plan Note (Signed)
Labs reviewed and discussed with patient that Leukocytosis, differential diagnosis is broad, acute or chronic infection and inflammation, smoking, autoimmune disease,obesity or underlying bone marrow disorders.   For the work up of patient's leukocytosis, I recommend checking CBC;CMP, LDH, smear review, peripheral flowcytometry, hepatitis, HIV, monoclonal gammopathy workup.

## 2022-09-25 NOTE — Progress Notes (Signed)
Hematology/Oncology Consult Note [phone] [phone]     REFERRING PROVIDER: Marina Goodell, MD   CHIEF COMPLAINTS/REASON FOR VISIT:  Evaluation of leukocytosis  ASSESSMENT & PLAN:   Leucocytosis Labs reviewed and discussed with patient that Leukocytosis, differential diagnosis is broad, acute or chronic infection and inflammation, smoking, autoimmune disease,obesity or underlying bone marrow disorders.   For the work up of patient's leukocytosis, I recommend checking CBC;CMP, LDH, smear review, peripheral flowcytometry, hepatitis, HIV, monoclonal gammopathy workup.     Orders Placed This Encounter  Procedures   CBC with Differential/Platelet    Standing Status:   Future    Number of Occurrences:   1    Standing Expiration Date:   09/25/2023   Flow cytometry panel-leukemia/lymphoma work-up    Standing Status:   Future    Number of Occurrences:   1    Standing Expiration Date:   09/25/2023   Lactate dehydrogenase    Standing Status:   Future    Number of Occurrences:   1    Standing Expiration Date:   09/25/2023   Hepatitis panel, acute    Standing Status:   Future    Number of Occurrences:   1    Standing Expiration Date:   09/25/2023   HIV Antibody (routine testing w rflx)    Standing Status:   Future    Number of Occurrences:   1    Standing Expiration Date:   09/25/2023   Multiple Myeloma Panel (SPEP&IFE w/QIG)    Standing Status:   Future    Number of Occurrences:   1    Standing Expiration Date:   09/25/2023   Kappa/lambda light chains    Standing Status:   Future    Number of Occurrences:   1    Standing Expiration Date:   09/25/2023   Technologist smear review    Standing Status:   Future    Number of Occurrences:   1    Standing Expiration Date:   09/25/2023    Order Specific Question:   Clinical information:    Answer:   leukocytosis    Return of visit: 3-4 weeks to discuss results.  [place]c [name], [name]* All questions  were answered. The patient knows to call the clinic with any problems, questions or concerns.  Rickard Patience, MD, PhD [facility] Hematology Oncology 09/25/2022    HISTORY OF PRESENTING ILLNESS:  Richard Duncan is a  72 y.o.  male with PMH listed below who was referred to me for evaluation of leukocytosis Reviewed patient' previous labs.  He has chronic  Leukocytosis onset of chronic, duration is since at least 2014  Patient denies unintentional weight loss, fever, chills,night sweats.   Former smoker, quitted many yeas ago.  Denies history of recent oral steroid use or steroid injection:  Denies recent infection, denies sinus congestion, cough, urinary frequency/urgency or dysuria, diarrhea, joint swelling/pain or abnormal skin rash, prosthetics. He has chronic arthritis.  Denies autoimmune disease history.      MEDICAL HISTORY:  Past Medical History:  Diagnosis Date   Anemia    Anxiety    Aortic atherosclerosis (HCC)    Arthritis    Arthropathia 11/08/2013   Bilateral carpal tunnel syndrome    CAD (coronary artery disease) 09/13/2011   a.) LHC 09/13/2014: EF 60%. 40% OM1, 40% LAD, 40% LCx, mRCA with minor luminal irregs - med mgmt.   Chronic pain syndrome    a.) followed by pain management; on buprenorphine/naloxone  CKD (chronic kidney disease), stage III (HCC)    Complete heart block (HCC) 05/2011   a.) s/p PPM placement 06/28/2011   Complication of anesthesia    a.) urinary retention with PROPRANOLOL and PROPOFOL; has required urinary catheterization in the past   COPD (chronic obstructive pulmonary disease) (HCC)    DDD (degenerative disc disease), cervical    a.) s/p ACDF C5-C6; resulting/residual postoperative neck "stiffness"   Depression    Diastolic dysfunction 10/10/2016   a.) TTE 10/10/2016: EF >55%, triv MR/TR/PR, G1DD; b.) TTE 02/08/2020: EF >55%, mild LVH, triv MR/TR   Difficult intubation    a.) secondary to macroglossia + reduced cervical mobility (s/p  cervical fusion)   Dyspnea    ED (erectile dysfunction)    a.) on PDE5i (tadalafil)   GERD (gastroesophageal reflux disease)    Hernia, hiatal    History of bilateral cataract extraction 2020   History of blood product transfusion    History of kidney stones    Hyperlipemia    Hypertension    Neuropathy involving both lower extremities    Obesity    OSA on CPAP    Pre-diabetes    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   a.) Medtronic device placed 06/28/2011; b.) generator changed 03/08/2021   Prostate cancer (HCC) 2011   Prostatitis    PTSD (post-traumatic stress disorder)    Spinal cord stimulator status    a.) non-functional; "battery died"   Spondylolysis of thoracolumbar region    a.) s/p lumbar laminectomy x 2    SURGICAL HISTORY: Past Surgical History:  Procedure Laterality Date   ANTERIOR CERVICAL DECOMP/DISCECTOMY FUSION N/A 2008   Procedure: ANTERIOR CERVICAL DECOMP/DISCECTOMY FUSION (C5-C6)   APPENDECTOMY     CARDIAC PACEMAKER PLACEMENT  06/28/2011   Procedure: CARDIAC PACEMAKER PLACEMENT; Location: [facility]; Surgeon: Adrian Blackwater, MD   CARPAL TUNNEL RELEASE Left 10/17/2021   Procedure: CARPAL TUNNEL RELEASE ENDOSCOPIC;  Surgeon: Christena Flake, MD;  Location: [facility];  Service: Orthopedics;  Laterality: Left;   CARPAL TUNNEL RELEASE Right 03/06/2022   Procedure: CARPAL TUNNEL RELEASE ENDOSCOPIC;  Surgeon: Christena Flake, MD;  Location: [facility];  Service: Orthopedics;  Laterality: Right;   CATARACT EXTRACTION W/PHACO Right 12/28/2018   Procedure: CATARACT EXTRACTION PHACO AND INTRAOCULAR LENS PLACEMENT (IOC) RIGHT 2.82  00:26.4;  Surgeon: Nevada Crane, MD;  Location: [facility];  Service: Ophthalmology;  Laterality: Right;   CATARACT EXTRACTION W/PHACO Left 01/25/2019   Procedure: CATARACT EXTRACTION PHACO AND INTRAOCULAR LENS PLACEMENT (IOC) LEFT;  Surgeon: Nevada Crane, MD;  Location: [facility];  Service: Ophthalmology;   Laterality: Left;  CDE 11.49 [place] 0:24.4   COLONOSCOPY     2006, 2015, 2021   COLONOSCOPY WITH PROPOFOL N/A 06/25/2019   Procedure: COLONOSCOPY WITH PROPOFOL;  Surgeon: Pasty Spillers, MD;  Location: [facility];  Service: Endoscopy;  Laterality: N/A;  ALLERGIC TO PROPOFOL PER OFFICE   CYSTOSCOPY W/ URETERAL STENT PLACEMENT Bilateral 11/01/2016   Procedure: CYSTOSCOPY WITH STENT REPLACEMENT;  Surgeon: Hildred Laser, MD;  Location: [facility];  Service: Urology;  Laterality: Bilateral;   CYSTOSCOPY W/ URETERAL STENT PLACEMENT Left 12/10/2016   Procedure: CYSTOSCOPY WITH STENT REPLACEMENT;  Surgeon: Riki Altes, MD;  Location: [facility];  Service: Urology;  Laterality: Left;   CYSTOSCOPY W/ URETERAL STENT REMOVAL Bilateral 11/27/2016   Procedure: CYSTOSCOPY WITH STENT REMOVAL/EXCHANGE;  Surgeon: Hildred Laser, MD;  Location: [facility];  Service: Urology;  Laterality: Bilateral;   CYSTOSCOPY  WITH STENT PLACEMENT Bilateral 09/27/2016   Procedure: CYSTOSCOPY WITH STENT PLACEMENT;  Surgeon: Hildred Laser, MD;  Location: [facility];  Service: Urology;  Laterality: Bilateral;   EXCISION OF ABDOMINAL WALL TUMOR N/A 03/08/2016   Procedure: EXCISION OF ABDOMINAL WALL MASS;  Surgeon: Nadeen Landau, MD;  Location: [facility];  Service: General;  Laterality: N/A;   HOLEP-LASER ENUCLEATION OF THE PROSTATE WITH MORCELLATION N/A 09/25/2018   Procedure: HOLEP-LASER ENUCLEATION OF THE PROSTATE WITH MORCELLATION;  Surgeon: Sondra Come, MD;  Location: [facility];  Service: Urology;  Laterality: N/A;   JOINT REPLACEMENT  2004, 2005, 2014, 2015   Bilat Hip Replacements   LEFT HEART CATH AND CORONARY ANGIOGRAPHY Left 09/13/2011   Procedure: LEFT HEART CATH AND CORONARY ANGIOGRAPHY; Location: [facility]; Surgeon: Adrian Blackwater, MD   LUMBAR LAMINECTOMY  1988   LUMBAR LAMINECTOMY  2006   PPM GENERATOR CHANGEOUT N/A 03/08/2021   Procedure: PPM GENERATOR CHANGEOUT;  Surgeon: Marcina Millard,  MD;  Location: [facility];  Service: Cardiovascular;  Laterality: N/A;   SPINAL CORD STIMULATOR INSERTION N/A 07/08/2014   Procedure: LUMBAR SPINAL CORD STIMULATOR INSERTION;  Surgeon: Odette Fraction, MD;  Location: [facility];  Service: Neurosurgery;  Laterality: N/A;  LUMBAR SPINAL CORD STIMULATOR INSERTION   TONSILLECTOMY     TRIGGER FINGER RELEASE Left 10/17/2021   Procedure: RELEASE TRIGGER FINGER/A-1 PULLEY;  Surgeon: Christena Flake, MD;  Location: [facility];  Service: Orthopedics;  Laterality: Left;   TRIGGER FINGER RELEASE Right 03/06/2022   Procedure: RELEASE TRIGGER FINGER/A-1 PULLEY;  Surgeon: Christena Flake, MD;  Location: [facility];  Service: Orthopedics;  Laterality: Right;   URETEROSCOPY WITH HOLMIUM LASER LITHOTRIPSY Bilateral 11/01/2016   Procedure: URETEROSCOPY WITH HOLMIUM LASER LITHOTRIPSY;  Surgeon: Hildred Laser, MD;  Location: [facility];  Service: Urology;  Laterality: Bilateral;   URETEROSCOPY WITH HOLMIUM LASER LITHOTRIPSY Left 12/10/2016   Procedure: URETEROSCOPY WITH HOLMIUM LASER LITHOTRIPSY;  Surgeon: Riki Altes, MD;  Location: [facility];  Service: Urology;  Laterality: Left;   URETEROSCOPY WITH HOLMIUM LASER LITHOTRIPSY Left 11/27/2016   Procedure: URETEROSCOPY WITH HOLMIUM LASER LITHOTRIPSY;  Surgeon: Hildred Laser, MD;  Location: [facility];  Service: Urology;  Laterality: Left;   VITRECTOMY Bilateral 08/22/2021    SOCIAL HISTORY: Social History   Socioeconomic History   Marital status: Married    Spouse name: Jasmine December   Number of children: 2   Years of education: Not on file   Highest education level: Not on file  Occupational History   Not on file  Tobacco Use   Smoking status: Former    Current packs/day: 0.00    Types: Cigarettes    Start date: 06/17/1964    Quit date: 06/18/1966    Years since quitting: 56.3    Passive exposure: Never   Smokeless tobacco: Never   Tobacco comments:    quit 40 years ago  Vaping Use   Vaping status:  Never Used  Substance and Sexual Activity   Alcohol use: Not Currently    Comment: rarely   Drug use: No   Sexual activity: Not Currently  Other Topics Concern   Not on file  Social History Narrative   Not on file   Social Determinants of Health   Financial Resource Strain: Not on file  Food Insecurity: No Food Insecurity (09/25/2022)   Hunger Vital Sign    Worried About Running Out of Food in the Last Year: Never true    Ran Out of  Food in the Last Year: Never true  Transportation Needs: No Transportation Needs (09/25/2022)   PRAPARE - Administrator, Civil Service (Medical): No    Lack of Transportation (Non-Medical): No  Physical Activity: Not on file  Stress: Not on file  Social Connections: Not on file  Intimate Partner Violence: Not At Risk (09/25/2022)   Humiliation, Afraid, Rape, and Kick questionnaire    Fear of Current or Ex-Partner: No    Emotionally Abused: No    Physically Abused: No    Sexually Abused: No    FAMILY HISTORY: Family History  Problem Relation Age of Onset   Hypertension Mother    Alzheimer's disease Mother    Benign prostatic hyperplasia Father    Prostate cancer Father    Kidney disease Neg Hx    Kidney cancer Neg Hx    Bladder Cancer Neg Hx     ALLERGIES:  is allergic to other, propofol, and propranolol.  MEDICATIONS:  Current Outpatient Medications  Medication Sig Dispense Refill   amLODipine (NORVASC) 10 MG tablet Take 10 mg by mouth in the morning.     atorvastatin (LIPITOR) 20 MG tablet Take 20 mg by mouth in the morning.     budesonide-formoterol (SYMBICORT) 160-4.5 MCG/ACT inhaler Inhale 2 puffs into the lungs 2 (two) times daily.     Buprenorphine HCl-Naloxone HCl 2-0.5 MG FILM Place 1 Film under the tongue in the morning and at bedtime.     chlorthalidone (HYGROTON) 25 MG tablet Take 25 mg by mouth in the morning.     Cholecalciferol (VITAMIN D3 PO) Take 1 tablet by mouth every 3 (three) days.     citalopram  (CELEXA) 40 MG tablet Take 40 mg by mouth in the morning.     fluticasone (FLONASE) 50 MCG/ACT nasal spray Place 2 sprays into both nostrils 2 (two) times daily.     fluticasone furoate-vilanterol (BREO ELLIPTA) 100-25 MCG/ACT AEPB Inhale 1 puff into the lungs daily.     pantoprazole (PROTONIX) 40 MG tablet Take 40 mg by mouth in the morning.     Potassium Citrate 15 MEQ (1620 MG) TBCR Take 2 tablets by mouth in the morning and at bedtime. 360 tablet 3   tadalafil (CIALIS) 10 MG tablet Take 1 tablet (10 mg total) by mouth daily as needed for erectile dysfunction. 90 tablet 5   No current facility-administered medications for this visit.    Review of Systems  Constitutional:  Negative for appetite change, chills, fatigue, fever and unexpected weight change.  HENT:   Negative for hearing loss and voice change.   Eyes:  Negative for eye problems and icterus.  Respiratory:  Negative for chest tightness, cough and shortness of breath.   Cardiovascular:  Negative for chest pain and leg swelling.  Gastrointestinal:  Negative for abdominal distention and abdominal pain.  Endocrine: Negative for hot flashes.  Genitourinary:  Negative for difficulty urinating, dysuria and frequency.   Musculoskeletal:  Positive for arthralgias.  Skin:  Negative for itching and rash.  Neurological:  Negative for light-headedness and numbness.  Hematological:  Negative for adenopathy. Does not bruise/bleed easily.  Psychiatric/Behavioral:  Negative for confusion.     PHYSICAL EXAMINATION: ECOG PERFORMANCE STATUS: 1 - Symptomatic but completely ambulatory Vitals:   09/25/22 1118  BP: 121/71  Pulse: 72  Resp: 18  Temp: 98.2 F (36.8 C)   Filed Weights   09/25/22 1118  Weight: (!) 340 lb 3.2 oz (154.3 kg)    Physical Exam  Constitutional:      General: He is not in acute distress.    Appearance: He is obese.     Comments: Walks with a walker.   HENT:     Head: Normocephalic and atraumatic.  Eyes:      General: No scleral icterus. Cardiovascular:     Rate and Rhythm: Normal rate and regular rhythm.     Heart sounds: Normal heart sounds.  Pulmonary:     Effort: Pulmonary effort is normal. No respiratory distress.     Breath sounds: No wheezing.  Abdominal:     General: Bowel sounds are normal. There is no distension.     Palpations: Abdomen is soft.  Musculoskeletal:        General: No deformity. Normal range of motion.     Cervical back: Normal range of motion and neck supple.  Skin:    General: Skin is warm and dry.     Findings: No erythema or rash.     Comments: Multiple skin tattoos  Neurological:     Mental Status: He is alert and oriented to person, place, and time. Mental status is at baseline.  Psychiatric:        Mood and Affect: Mood normal.        RADIOGRAPHIC STUDIES: I have personally reviewed the radiological images as listed and agreed with the findings in the report. No results found.  LABORATORY DATA:  I have reviewed the data as listed    Latest Ref Rng & Units 09/25/2022   11:54 AM 03/06/2022    8:47 AM 08/14/2019    7:00 AM  CBC  WBC 4.0 - 10.5 K/uL 9.8  10.9  14.0   Hemoglobin 13.0 - 17.0 g/dL 53.6  64.4  03.4   Hematocrit 39.0 - 52.0 % 43.0  40.6  49.3   Platelets 150 - 400 K/uL 165  171  221       Latest Ref Rng & Units 10/09/2021   11:19 AM 08/14/2019    7:00 AM 08/03/2018    1:12 PM  CMP  Glucose 70 - 99 mg/dL  742  595   BUN 8 - 23 mg/dL  24  20   Creatinine 6.38 - 1.24 mg/dL  7.56  4.33   Sodium 295 - 145 mmol/L  140  137   Potassium 3.5 - 5.1 mmol/L 4.6  4.2  4.2   Chloride 98 - 111 mmol/L  102  107   CO2 22 - 32 mmol/L  26  21   Calcium 8.9 - 10.3 mg/dL  9.2  8.7   Total Protein 6.5 - 8.1 g/dL  7.9    Total Bilirubin 0.3 - 1.2 mg/dL  0.9    Alkaline Phos 38 - 126 U/L  95    AST 15 - 41 U/L  21    ALT 0 - 44 U/L  23

## 2022-09-27 LAB — KAPPA/LAMBDA LIGHT CHAINS
Kappa free light chain: 25.6 mg/L — ABNORMAL HIGH (ref 3.3–19.4)
Kappa, lambda light chain ratio: 1.27 (ref 0.26–1.65)
Lambda free light chains: 20.1 mg/L (ref 5.7–26.3)

## 2022-09-28 LAB — COMP PANEL: LEUKEMIA/LYMPHOMA

## 2022-09-30 LAB — MULTIPLE MYELOMA PANEL, SERUM
Albumin SerPl Elph-Mcnc: 3.6 g/dL (ref 2.9–4.4)
Albumin/Glob SerPl: 1.3 (ref 0.7–1.7)
Alpha 1: 0.3 g/dL (ref 0.0–0.4)
Alpha2 Glob SerPl Elph-Mcnc: 0.7 g/dL (ref 0.4–1.0)
B-Globulin SerPl Elph-Mcnc: 0.9 g/dL (ref 0.7–1.3)
Gamma Glob SerPl Elph-Mcnc: 1.1 g/dL (ref 0.4–1.8)
Globulin, Total: 3 g/dL (ref 2.2–3.9)
IgA: 242 mg/dL (ref 61–437)
IgG (Immunoglobin G), Serum: 1005 mg/dL (ref 603–1613)
IgM (Immunoglobulin M), Srm: 92 mg/dL (ref 20–172)
Total Protein ELP: 6.6 g/dL (ref 6.0–8.5)

## 2022-10-01 ENCOUNTER — Encounter: Payer: Self-pay | Admitting: Oncology

## 2022-10-21 ENCOUNTER — Ambulatory Visit: Payer: Medicare HMO | Admitting: Oncology

## 2022-10-21 ENCOUNTER — Encounter: Payer: Self-pay | Admitting: *Deleted

## 2022-10-28 ENCOUNTER — Ambulatory Visit: Payer: Medicare HMO

## 2022-10-28 ENCOUNTER — Encounter
Admission: RE | Disposition: A | Discharge status: Home / Self Care | Payer: Self-pay | Source: Home / Self Care | Attending: Gastroenterology

## 2022-10-28 ENCOUNTER — Encounter: Payer: Self-pay | Admitting: *Deleted

## 2022-10-28 ENCOUNTER — Other Ambulatory Visit: Payer: Self-pay

## 2022-10-28 ENCOUNTER — Ambulatory Visit
Admission: RE | Admit: 2022-10-28 | Discharge: 2022-10-28 | Disposition: A | Discharge status: Home / Self Care | Payer: Medicare HMO | Attending: Gastroenterology | Admitting: Gastroenterology

## 2022-10-28 DIAGNOSIS — F419 Anxiety disorder, unspecified: Secondary | ICD-10-CM | POA: Insufficient documentation

## 2022-10-28 DIAGNOSIS — I499 Cardiac arrhythmia, unspecified: Secondary | ICD-10-CM | POA: Insufficient documentation

## 2022-10-28 DIAGNOSIS — Z86010 Personal history of colonic polyps: Secondary | ICD-10-CM | POA: Diagnosis not present

## 2022-10-28 DIAGNOSIS — Z7951 Long term (current) use of inhaled steroids: Secondary | ICD-10-CM | POA: Insufficient documentation

## 2022-10-28 DIAGNOSIS — F32A Depression, unspecified: Secondary | ICD-10-CM | POA: Diagnosis not present

## 2022-10-28 DIAGNOSIS — J449 Chronic obstructive pulmonary disease, unspecified: Secondary | ICD-10-CM | POA: Diagnosis not present

## 2022-10-28 DIAGNOSIS — Z8546 Personal history of malignant neoplasm of prostate: Secondary | ICD-10-CM | POA: Diagnosis not present

## 2022-10-28 DIAGNOSIS — I2510 Atherosclerotic heart disease of native coronary artery without angina pectoris: Secondary | ICD-10-CM | POA: Diagnosis not present

## 2022-10-28 DIAGNOSIS — K640 First degree hemorrhoids: Secondary | ICD-10-CM | POA: Diagnosis not present

## 2022-10-28 DIAGNOSIS — R519 Headache, unspecified: Secondary | ICD-10-CM | POA: Diagnosis not present

## 2022-10-28 DIAGNOSIS — Z1211 Encounter for screening for malignant neoplasm of colon: Secondary | ICD-10-CM | POA: Diagnosis present

## 2022-10-28 DIAGNOSIS — Z950 Presence of cardiac pacemaker: Secondary | ICD-10-CM | POA: Diagnosis not present

## 2022-10-28 DIAGNOSIS — G4733 Obstructive sleep apnea (adult) (pediatric): Secondary | ICD-10-CM | POA: Diagnosis not present

## 2022-10-28 DIAGNOSIS — Z6841 Body Mass Index (BMI) 40.0 and over, adult: Secondary | ICD-10-CM | POA: Diagnosis not present

## 2022-10-28 DIAGNOSIS — I129 Hypertensive chronic kidney disease with stage 1 through stage 4 chronic kidney disease, or unspecified chronic kidney disease: Secondary | ICD-10-CM | POA: Insufficient documentation

## 2022-10-28 DIAGNOSIS — N183 Chronic kidney disease, stage 3 unspecified: Secondary | ICD-10-CM | POA: Diagnosis not present

## 2022-10-28 DIAGNOSIS — E785 Hyperlipidemia, unspecified: Secondary | ICD-10-CM | POA: Diagnosis not present

## 2022-10-28 DIAGNOSIS — G894 Chronic pain syndrome: Secondary | ICD-10-CM | POA: Insufficient documentation

## 2022-10-28 DIAGNOSIS — D128 Benign neoplasm of rectum: Secondary | ICD-10-CM | POA: Diagnosis not present

## 2022-10-28 DIAGNOSIS — D123 Benign neoplasm of transverse colon: Secondary | ICD-10-CM | POA: Diagnosis not present

## 2022-10-28 DIAGNOSIS — Z87891 Personal history of nicotine dependence: Secondary | ICD-10-CM | POA: Diagnosis not present

## 2022-10-28 HISTORY — PX: POLYPECTOMY: SHX5525

## 2022-10-28 HISTORY — PX: COLONOSCOPY WITH PROPOFOL: SHX5780

## 2022-10-28 SURGERY — COLONOSCOPY WITH PROPOFOL
Anesthesia: General

## 2022-10-28 MED ORDER — SODIUM CHLORIDE 0.9 % IV SOLN: INTRAVENOUS | Status: DC

## 2022-10-28 MED ORDER — PROPOFOL 500 MG/50ML IV EMUL
INTRAVENOUS | Status: DC | PRN
  Administered 2022-10-28: 20 mg via INTRAVENOUS
  Administered 2022-10-28: 80 mg via INTRAVENOUS
  Administered 2022-10-28: 75 ug/kg/min via INTRAVENOUS

## 2022-10-28 MED ORDER — LIDOCAINE HCL (CARDIAC) PF 100 MG/5ML IV SOSY
PREFILLED_SYRINGE | INTRAVENOUS | Status: DC | PRN
  Administered 2022-10-28: 50 mg via INTRAVENOUS

## 2022-10-28 MED ORDER — PROPOFOL 10 MG/ML IV BOLUS
INTRAVENOUS | Status: AC
  Filled 2022-10-28: qty 20

## 2022-10-28 MED ORDER — PROPOFOL 10 MG/ML IV BOLUS
INTRAVENOUS | Status: AC
  Filled 2022-10-28: qty 40

## 2022-10-28 MED ORDER — DEXMEDETOMIDINE HCL IN NACL 80 MCG/20ML IV SOLN
INTRAVENOUS | Status: DC | PRN
Start: 2022-10-28 — End: 2022-10-28
  Administered 2022-10-28: 4 ug via INTRAVENOUS

## 2022-10-28 NOTE — Anesthesia Preprocedure Evaluation (Signed)
Anesthesia Evaluation  Patient identified by MRN, date of birth, ID band Patient awake    Reviewed: Allergy & Precautions, NPO status , Patient's Chart, lab work & pertinent test results  History of Anesthesia Complications (+) DIFFICULT AIRWAY and history of anesthetic complications  Airway Mallampati: II  TM Distance: >3 FB Neck ROM: Full    Dental  (+) Teeth Intact   Pulmonary neg pulmonary ROS, sleep apnea and Continuous Positive Airway Pressure Ventilation , COPD,  COPD inhaler, Patient abstained from smoking.Not current smoker, former smoker   Pulmonary exam normal breath sounds clear to auscultation       Cardiovascular Exercise Tolerance: Good METShypertension, + CAD  (-) Past MI + dysrhythmias  Rhythm:Regular Rate:Normal - Systolic murmurs    Neuro/Psych  Headaches PSYCHIATRIC DISORDERS Anxiety Depression       GI/Hepatic hiatal hernia,GERD  ,,(+)     (-) substance abuse    Endo/Other  neg diabetes  Morbid obesity  Renal/GU CRFRenal disease     Musculoskeletal   Abdominal  (+) + obese  Peds  Hematology   Anesthesia Other Findings Past Medical History: No date: Anemia No date: Anxiety No date: Aortic atherosclerosis (HCC) No date: Arthritis 11/08/2013: Arthropathia No date: Bilateral carpal tunnel syndrome 09/13/2011: CAD (coronary artery disease)     Comment:  a.) LHC 09/13/2014: EF 60%. 40% OM1, 40% LAD, 40% LCx,               mRCA with minor luminal irregs - med mgmt. No date: Chronic pain syndrome     Comment:  a.) followed by pain management; on               buprenorphine/naloxone No date: CKD (chronic kidney disease), stage III (HCC) 05/2011: Complete heart block (HCC)     Comment:  a.) s/p PPM placement 06/28/2011 No date: Complication of anesthesia     Comment:  a.) urinary retention with PROPRANOLOL and PROPOFOL; has              required urinary catheterization in the past No date:  COPD (chronic obstructive pulmonary disease) (HCC) No date: DDD (degenerative disc disease), cervical     Comment:  a.) s/p ACDF C5-C6; resulting/residual postoperative               neck "stiffness" No date: Depression 10/10/2016: Diastolic dysfunction     Comment:  a.) TTE 10/10/2016: EF >55%, triv MR/TR/PR, G1DD; b.)               TTE 02/08/2020: EF >55%, mild LVH, triv MR/TR No date: Difficult intubation     Comment:  a.) secondary to macroglossia + reduced cervical               mobility (s/p cervical fusion) No date: Dyspnea No date: ED (erectile dysfunction)     Comment:  a.) on PDE5i (tadalafil) No date: GERD (gastroesophageal reflux disease) No date: Hernia, hiatal 2020: History of bilateral cataract extraction No date: History of blood product transfusion No date: History of kidney stones No date: Hyperlipemia No date: Hypertension No date: Neuropathy involving both lower extremities No date: Obesity No date: OSA on CPAP No date: Pre-diabetes 06/28/2011: Presence of biventricular cardiac pacemaker     Comment:  a.) Medtronic device placed 06/28/2011; b.) generator               changed 03/08/2021 2011: Prostate cancer (HCC) No date: Prostatitis No date: PTSD (post-traumatic stress disorder) No date: Spinal cord stimulator  status     Comment:  a.) non-functional; "battery died" No date: Spondylolysis of thoracolumbar region     Comment:  a.) s/p lumbar laminectomy x 2  Reproductive/Obstetrics                             Anesthesia Physical Anesthesia Plan  ASA: 3  Anesthesia Plan: General   Post-op Pain Management: Minimal or no pain anticipated   Induction: Intravenous  PONV Risk Score and Plan: 3 and Propofol infusion, TIVA and Ondansetron  Airway Management Planned: Nasal CPAP and Natural Airway  Additional Equipment: None  Intra-op Plan:   Post-operative Plan:   Informed Consent: I have reviewed the patients History and  Physical, chart, labs and discussed the procedure including the risks, benefits and alternatives for the proposed anesthesia with the patient or authorized representative who has indicated his/her understanding and acceptance.     Dental advisory given  Plan Discussed with: CRNA and Surgeon  Anesthesia Plan Comments: (Discussed risks of anesthesia with patient, including possibility of difficulty with spontaneous ventilation under anesthesia necessitating airway intervention, PONV, and rare risks such as cardiac or respiratory or neurological events, and allergic reactions. Discussed the role of CRNA in patient's perioperative care. Patient understands. Patient informed about increased incidence of above perioperative risk due to high BMI. Patient understands.  )       Anesthesia Quick Evaluation

## 2022-10-28 NOTE — Interval H&P Note (Signed)
History and Physical Interval Note:  10/28/2022 11:46 AM  Richard Duncan  has presented today for surgery, with the diagnosis of hx of adnomatous polyp.  The various methods of treatment have been discussed with the patient and family. After consideration of risks, benefits and other options for treatment, the patient has consented to  Procedure(s): COLONOSCOPY WITH PROPOFOL (N/A) as a surgical intervention.  The patient's history has been reviewed, patient examined, no change in status, stable for surgery.  I have reviewed the patient's chart and labs.  Questions were answered to the patient's satisfaction.     Regis Bill  Ok to proceed with colonoscopy

## 2022-10-28 NOTE — Op Note (Signed)
[facility] Gastroenterology Patient Name: Richard Duncan Procedure Date: 10/28/2022 11:10 AM MRN: [id number] Account #: [id number] Date of Birth: 1950-02-04 Admit Type: Outpatient Age: 72 Room: [facility] ENDO ROOM 3 Gender: Male Note Status: Finalized Instrument Name: Nelda Marseille [id number] Procedure:             Colonoscopy Indications:           Surveillance: Personal history of adenomatous polyps                         on last colonoscopy 3 years ago Providers:             Eather Colas MD, MD Medicines:             Monitored Anesthesia Care Complications:         No immediate complications. Estimated blood loss:                         Minimal. Procedure:             Pre-Anesthesia Assessment:                        - Prior to the procedure, a History and Physical was                         performed, and patient medications and allergies were                         reviewed. The patient is competent. The risks and                         benefits of the procedure and the sedation options and                         risks were discussed with the patient. All questions                         were answered and informed consent was obtained.                         Patient identification and proposed procedure were                         verified by the physician, the nurse, the                         anesthesiologist, the anesthetist and the technician                         in the endoscopy suite. Mental Status Examination:                         alert and oriented. Airway Examination: normal                         oropharyngeal airway and neck mobility. Respiratory                         Examination: clear to auscultation. CV Examination:  normal. Prophylactic Antibiotics: The patient does not                         require prophylactic antibiotics. Prior                         Anticoagulants: The patient has taken no  anticoagulant                         or antiplatelet agents. ASA Grade Assessment: III - A                         patient with severe systemic disease. After reviewing                         the risks and benefits, the patient was deemed in                         satisfactory condition to undergo the procedure. The                         anesthesia plan was to use monitored anesthesia care                         (MAC). Immediately prior to administration of                         medications, the patient was re-assessed for adequacy                         to receive sedatives. The heart rate, respiratory                         rate, oxygen saturations, blood pressure, adequacy of                         pulmonary ventilation, and response to care were                         monitored throughout the procedure. The physical                         status of the patient was re-assessed after the                         procedure.                        After obtaining informed consent, the colonoscope was                         passed under direct vision. Throughout the procedure,                         the patient's blood pressure, pulse, and oxygen                         saturations were monitored continuously. The  Colonoscope was introduced through the anus and                         advanced to the the cecum, identified by appendiceal                         orifice and ileocecal valve. The colonoscopy was                         somewhat difficult due to significant looping.                         Successful completion of the procedure was aided by                         applying abdominal pressure. The patient tolerated the                         procedure well. The quality of the bowel preparation                         was fair. The ileocecal valve, appendiceal orifice,                         and rectum were photographed. Findings:      The  perianal and digital rectal examinations were normal.      Two sessile polyps were found in the transverse colon. The polyps were 3       to 8 mm in size. These polyps were removed with a cold snare. Resection       and retrieval were complete. Estimated blood loss was minimal.      A 3 mm polyp was found in the rectum. The polyp was sessile. The polyp       was removed with a cold snare. Resection and retrieval were complete.       Estimated blood loss was minimal.      Internal hemorrhoids were found during retroflexion. The hemorrhoids       were Grade I (internal hemorrhoids that do not prolapse).      The exam was otherwise without abnormality on direct and retroflexion       views. Impression:            - Preparation of the colon was fair.                        - Two 3 to 8 mm polyps in the transverse colon,                         removed with a cold snare. Resected and retrieved.                        - One 3 mm polyp in the rectum, removed with a cold                         snare. Resected and retrieved.                        - Internal hemorrhoids.                        -  The examination was otherwise normal on direct and                         retroflexion views. Recommendation:        - Discharge patient to home.                        - Resume previous diet.                        - Continue present medications.                        - Await pathology results.                        - Repeat colonoscopy in 2-3 years because the bowel                         preparation was suboptimal.                        - Return to referring physician as previously                         scheduled. Procedure Code(s):     --- Professional ---                        [phone], Colonoscopy, flexible; with removal of                         tumor(s), polyp(s), or other lesion(s) by snare                         technique Diagnosis Code(s):     --- Professional ---                         Z86.010, Personal history of colonic polyps                        K64.0, First degree hemorrhoids                        D12.3, Benign neoplasm of transverse colon (hepatic                         flexure or splenic flexure)                        D12.8, Benign neoplasm of rectum CPT copyright 2022 [employer]. All rights reserved. The codes documented in this report are preliminary and upon coder review may  be revised to meet current compliance requirements. Eather Colas MD, MD 10/28/2022 12:33:10 PM Number of Addenda: 0 Note Initiated On: 10/28/2022 11:10 AM Scope Withdrawal Time: 0 hours 14 minutes 4 seconds  Total Procedure Duration: 0 hours 21 minutes 4 seconds  Estimated Blood Loss:  Estimated blood loss was minimal.      [facility]

## 2022-10-28 NOTE — Anesthesia Postprocedure Evaluation (Signed)
Anesthesia Post Note  Patient: Richard Duncan  Procedure(s) Performed: COLONOSCOPY WITH PROPOFOL POLYPECTOMY  Patient location during evaluation: Endoscopy Anesthesia Type: General Level of consciousness: awake and alert Pain management: pain level controlled Vital Signs Assessment: post-procedure vital signs reviewed and stable Respiratory status: spontaneous breathing, nonlabored ventilation, respiratory function stable and patient connected to nasal cannula oxygen Cardiovascular status: blood pressure returned to baseline and stable Postop Assessment: no apparent nausea or vomiting Anesthetic complications: no   No notable events documented.   Last Vitals:  Vitals:   10/28/22 1246 10/28/22 1256  BP: (!) 122/95 (!) 117/44  Pulse: 65 67  Resp: 16 16  Temp:    SpO2: 99% 99%    Last Pain:  Vitals:   10/28/22 1256  TempSrc:   PainSc: 0-No pain                 Corinda Gubler

## 2022-10-28 NOTE — Anesthesia Procedure Notes (Signed)
Procedure Name: MAC Date/Time: 10/28/2022 11:59 AM  Performed by: Elisabeth Pigeon, CRNAOxygen Delivery Method: Supernova nasal CPAP

## 2022-10-28 NOTE — H&P (Signed)
Outpatient short stay form Pre-procedure 10/28/2022  Richard Bill, MD  Primary Physician: Marina Goodell, MD  Reason for visit:  Surveillance colonoscopy  History of present illness:    72 y/o gentleman with history of hypertension, obesity, and ckd here for surveillance colonoscopy. Last colonoscopy in 2021 with several small Ta's. No blood thinners. No family history of GI malignancies. History of appendectomy.    Current Facility-Administered Medications:    0.9 %  sodium chloride infusion, , Intravenous, Continuous, Innocence Schlotzhauer, Rossie Muskrat, MD, Last Rate: 20 mL/hr at 10/28/22 1122, New Bag at 10/28/22 1122  Medications Prior to Admission  Medication Sig Dispense Refill Last Dose   amLODipine (NORVASC) 10 MG tablet Take 10 mg by mouth in the morning.   10/28/2022 at 0730   atorvastatin (LIPITOR) 20 MG tablet Take 20 mg by mouth in the morning.   10/27/2022   budesonide-formoterol (SYMBICORT) 160-4.5 MCG/ACT inhaler Inhale 2 puffs into the lungs 2 (two) times daily.   10/27/2022   Buprenorphine HCl-Naloxone HCl 2-0.5 MG FILM Place 1 Film under the tongue in the morning and at bedtime.   10/27/2022   chlorthalidone (HYGROTON) 25 MG tablet Take 25 mg by mouth in the morning.   10/28/2022 at 0730   Cholecalciferol (VITAMIN D3 PO) Take 1 tablet by mouth every 3 (three) days.   10/27/2022   citalopram (CELEXA) 40 MG tablet Take 40 mg by mouth in the morning.   10/27/2022   fluticasone (FLONASE) 50 MCG/ACT nasal spray Place 2 sprays into both nostrils 2 (two) times daily.   10/27/2022   fluticasone furoate-vilanterol (BREO ELLIPTA) 100-25 MCG/ACT AEPB Inhale 1 puff into the lungs daily.   10/27/2022   pantoprazole (PROTONIX) 40 MG tablet Take 40 mg by mouth in the morning.   10/27/2022   Potassium Citrate 15 MEQ (1620 MG) TBCR Take 2 tablets by mouth in the morning and at bedtime. 360 tablet 3 10/27/2022   tadalafil (CIALIS) 10 MG tablet Take 1 tablet (10 mg total) by mouth daily as needed for  erectile dysfunction. 90 tablet 5 Past Month     Allergies  Allergen Reactions   Other     Steroid eye drop caused eye irritation   Propofol Other (See Comments)    URINARY RETENTION REQUIRING CATHETERIZATION   Propranolol Other (See Comments)    Urinary retention     Past Medical History:  Diagnosis Date   Anemia    Anxiety    Aortic atherosclerosis (HCC)    Arthritis    Arthropathia 11/08/2013   Bilateral carpal tunnel syndrome    CAD (coronary artery disease) 09/13/2011   a.) LHC 09/13/2014: EF 60%. 40% OM1, 40% LAD, 40% LCx, mRCA with minor luminal irregs - med mgmt.   Chronic pain syndrome    a.) followed by pain management; on buprenorphine/naloxone   CKD (chronic kidney disease), stage III (HCC)    Complete heart block (HCC) 05/2011   a.) s/p PPM placement 06/28/2011   Complication of anesthesia    a.) urinary retention with PROPRANOLOL and PROPOFOL; has required urinary catheterization in the past   COPD (chronic obstructive pulmonary disease) (HCC)    DDD (degenerative disc disease), cervical    a.) s/p ACDF C5-C6; resulting/residual postoperative neck "stiffness"   Depression    Diastolic dysfunction 10/10/2016   a.) TTE 10/10/2016: EF >55%, triv MR/TR/PR, G1DD; b.) TTE 02/08/2020: EF >55%, mild LVH, triv MR/TR   Difficult intubation    a.) secondary to macroglossia + reduced  cervical mobility (s/p cervical fusion)   Dyspnea    ED (erectile dysfunction)    a.) on PDE5i (tadalafil)   GERD (gastroesophageal reflux disease)    Hernia, hiatal    History of bilateral cataract extraction 2020   History of blood product transfusion    History of kidney stones    Hyperlipemia    Hypertension    Neuropathy involving both lower extremities    Obesity    OSA on CPAP    Pre-diabetes    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    Physical Exam  Gen: Alert, oriented. Appears stated age.  HEENT: PERRLA. Lungs: No respiratory distress CV: RRR Abd: soft, benign, no masses Ext: No edema    Planned procedures: Proceed with colonoscopy. The patient understands the nature of the planned procedure, indications, risks, alternatives and potential complications including but not limited to bleeding, infection, perforation, damage to internal organs and possible oversedation/side effects from anesthesia. The patient agrees and gives consent to proceed.  Please refer to procedure notes for findings, recommendations and patient disposition/instructions.     Richard Bill, MD [facility] Gastroenterology

## 2022-10-28 NOTE — Transfer of Care (Signed)
Immediate Anesthesia Transfer of Care Note  Patient: Richard Duncan  Procedure(s) Performed: COLONOSCOPY WITH PROPOFOL POLYPECTOMY  Patient Location: PACU  Anesthesia Type:MAC  Level of Consciousness: awake  Airway & Oxygen Therapy: Patient Spontanous Breathing  Post-op Assessment: Report given to RN and Post -op Vital signs reviewed and stable  Post vital signs: Reviewed and stable  Last Vitals:  Vitals Value Taken Time  BP 143/61 10/28/22 1236  Temp 36.1 C 10/28/22 1236  Pulse 68 10/28/22 1237  Resp 13 10/28/22 1237  SpO2 96 % 10/28/22 1237  Vitals shown include unfiled device data.  Last Pain:  Vitals:   10/28/22 1236  TempSrc:   PainSc: 0-No pain         Complications: No notable events documented.

## 2022-10-28 NOTE — Anesthesia Procedure Notes (Signed)
Procedure Name: MAC Date/Time: 10/28/2022 12:04 PM  Performed by: Elisabeth Pigeon, CRNAOxygen Delivery Method: Simple face mask

## 2022-10-29 ENCOUNTER — Encounter: Payer: Self-pay | Admitting: Gastroenterology

## 2022-10-29 LAB — SURGICAL PATHOLOGY

## 2023-02-13 DIAGNOSIS — I10 Essential (primary) hypertension: Secondary | ICD-10-CM | POA: Diagnosis not present

## 2023-02-13 DIAGNOSIS — N2581 Secondary hyperparathyroidism of renal origin: Secondary | ICD-10-CM | POA: Diagnosis not present

## 2023-02-13 DIAGNOSIS — N200 Calculus of kidney: Secondary | ICD-10-CM | POA: Diagnosis not present

## 2023-02-13 DIAGNOSIS — N182 Chronic kidney disease, stage 2 (mild): Secondary | ICD-10-CM | POA: Diagnosis not present

## 2023-02-24 DIAGNOSIS — I129 Hypertensive chronic kidney disease with stage 1 through stage 4 chronic kidney disease, or unspecified chronic kidney disease: Secondary | ICD-10-CM | POA: Diagnosis not present

## 2023-02-24 DIAGNOSIS — N182 Chronic kidney disease, stage 2 (mild): Secondary | ICD-10-CM | POA: Diagnosis not present

## 2023-02-24 DIAGNOSIS — Z8546 Personal history of malignant neoplasm of prostate: Secondary | ICD-10-CM | POA: Diagnosis not present

## 2023-02-24 DIAGNOSIS — M159 Polyosteoarthritis, unspecified: Secondary | ICD-10-CM | POA: Diagnosis not present

## 2023-02-24 DIAGNOSIS — Z79891 Long term (current) use of opiate analgesic: Secondary | ICD-10-CM | POA: Diagnosis not present

## 2023-02-24 DIAGNOSIS — G4733 Obstructive sleep apnea (adult) (pediatric): Secondary | ICD-10-CM | POA: Diagnosis not present

## 2023-02-24 DIAGNOSIS — R2681 Unsteadiness on feet: Secondary | ICD-10-CM | POA: Diagnosis not present

## 2023-02-24 DIAGNOSIS — Z950 Presence of cardiac pacemaker: Secondary | ICD-10-CM | POA: Diagnosis not present

## 2023-02-24 DIAGNOSIS — F17211 Nicotine dependence, cigarettes, in remission: Secondary | ICD-10-CM | POA: Diagnosis not present

## 2023-02-24 DIAGNOSIS — J449 Chronic obstructive pulmonary disease, unspecified: Secondary | ICD-10-CM | POA: Diagnosis not present

## 2023-02-24 DIAGNOSIS — E785 Hyperlipidemia, unspecified: Secondary | ICD-10-CM | POA: Diagnosis not present

## 2023-02-24 DIAGNOSIS — Z008 Encounter for other general examination: Secondary | ICD-10-CM | POA: Diagnosis not present

## 2023-02-24 DIAGNOSIS — Z6841 Body Mass Index (BMI) 40.0 and over, adult: Secondary | ICD-10-CM | POA: Diagnosis not present

## 2023-02-24 DIAGNOSIS — F4310 Post-traumatic stress disorder, unspecified: Secondary | ICD-10-CM | POA: Diagnosis not present

## 2023-03-12 DIAGNOSIS — M51369 Other intervertebral disc degeneration, lumbar region without mention of lumbar back pain or lower extremity pain: Secondary | ICD-10-CM | POA: Diagnosis not present

## 2023-03-12 DIAGNOSIS — G629 Polyneuropathy, unspecified: Secondary | ICD-10-CM | POA: Diagnosis not present

## 2023-03-12 DIAGNOSIS — M542 Cervicalgia: Secondary | ICD-10-CM | POA: Diagnosis not present

## 2023-03-12 DIAGNOSIS — G894 Chronic pain syndrome: Secondary | ICD-10-CM | POA: Diagnosis not present

## 2023-03-28 ENCOUNTER — Other Ambulatory Visit: Payer: Self-pay

## 2023-03-28 DIAGNOSIS — C61 Malignant neoplasm of prostate: Secondary | ICD-10-CM

## 2023-04-01 ENCOUNTER — Other Ambulatory Visit
Admission: RE | Admit: 2023-04-01 | Discharge: 2023-04-01 | Disposition: A | Discharge status: Home / Self Care | Attending: Urology | Admitting: Urology

## 2023-04-01 ENCOUNTER — Ambulatory Visit (INDEPENDENT_AMBULATORY_CARE_PROVIDER_SITE_OTHER): Payer: Medicare HMO | Admitting: Urology

## 2023-04-01 VITALS — BP 125/67 | HR 99 | Wt 347.4 lb

## 2023-04-01 DIAGNOSIS — Z87442 Personal history of urinary calculi: Secondary | ICD-10-CM | POA: Diagnosis not present

## 2023-04-01 DIAGNOSIS — N529 Male erectile dysfunction, unspecified: Secondary | ICD-10-CM | POA: Diagnosis not present

## 2023-04-01 DIAGNOSIS — Z8546 Personal history of malignant neoplasm of prostate: Secondary | ICD-10-CM | POA: Diagnosis not present

## 2023-04-01 DIAGNOSIS — C61 Malignant neoplasm of prostate: Secondary | ICD-10-CM | POA: Insufficient documentation

## 2023-04-01 DIAGNOSIS — N200 Calculus of kidney: Secondary | ICD-10-CM

## 2023-04-01 LAB — PSA: Prostatic Specific Antigen: 2.36 ng/mL (ref 0.00–4.00)

## 2023-04-01 LAB — BLADDER SCAN AMB NON-IMAGING

## 2023-04-01 MED ORDER — SILDENAFIL CITRATE 50 MG PO TABS: 50.0000 mg | ORAL_TABLET | Freq: Every day | ORAL | 6 refills | Status: AC | PRN

## 2023-04-01 MED ORDER — SILDENAFIL CITRATE 50 MG PO TABS: 50.0000 mg | ORAL_TABLET | ORAL | 6 refills | Status: DC | PRN

## 2023-04-01 MED ORDER — POTASSIUM CITRATE ER 15 MEQ (1620 MG) PO TBCR: 2.0000 | EXTENDED_RELEASE_TABLET | Freq: Two times a day (BID) | ORAL | 3 refills | Status: AC

## 2023-04-01 NOTE — Progress Notes (Signed)
   04/01/2023 8:39 AM   Richard Duncan Mar 16, 1950 [id number]  Reason for visit: Follow up BPH status post HOLEP ,low risk prostate cancer, nephrolithiasis, ED, CKD  HPI: He is a 73 year old male with morbid obesity and BMI of 44, history of very low risk prostate cancer diagnosed in 2010 with a PSA of 4 and follow-up biopsy in 2014 that confirmed very low risk disease, and PSA has been stable since that time.  He had urodynamics that confirmed severe bladder outlet obstruction and elevated PVRs, and he ultimately elected to undergo HOLEP for management of his urinary symptoms.  He underwent an uncomplicated HOLEP on 09/25/2018 with removal of a large median lobe, pathology showed 31 g of benign tissue.  PSA February 2024 stable at 1.74, PSA today is pending. He denies any incontinence and continues to void with a strong stream, PVRs have always been normal postop.  PVR today normal at 74ml.  He also has a history of recurrent uric acid stones, and has had an excellent response to alkalinization therapy with potassium citrate with significant decrease in stone burden on follow-up CT.  He denies any stone episodes over the last year and would like to continue the potassium citrate.  We also discussed increasing fluid intake, low-salt diet, and avoiding high animal proteins.  He denies any stone episodes in the last year.  In terms of his ED, using Cialis 10 mg daily with a 10 mg boost dose as needed.  He does feel his erections have worsened over the last year as his health has declined, he is on chronic narcotic.  He was interested in trying sildenafil boost to his daily Cialis.  PSA today, call with results, continue yearly PSA with history of low risk prostate cancer Continue potassium citrate, refilled Cialis refilled 10 mg daily, trial of sildenafil 50 mg boost dose as needed.  Could also try Cialis 20 mg daily. RTC 1 year symptom check, PSA, and PVR  Sondra Come, MD  [facility] [address] [place], [place] [address] [phone]

## 2023-04-01 NOTE — Addendum Note (Signed)
 Addended by: Debbe Bales C on: 04/01/2023 09:00 AM   Modules accepted: Orders

## 2023-05-06 DIAGNOSIS — I459 Conduction disorder, unspecified: Secondary | ICD-10-CM | POA: Diagnosis not present

## 2023-05-21 DIAGNOSIS — J301 Allergic rhinitis due to pollen: Secondary | ICD-10-CM | POA: Diagnosis not present

## 2023-05-21 DIAGNOSIS — R0609 Other forms of dyspnea: Secondary | ICD-10-CM | POA: Diagnosis not present

## 2023-05-21 DIAGNOSIS — J439 Emphysema, unspecified: Secondary | ICD-10-CM | POA: Diagnosis not present

## 2023-05-28 ENCOUNTER — Other Ambulatory Visit: Payer: Self-pay | Admitting: Urology

## 2023-06-02 DIAGNOSIS — G44209 Tension-type headache, unspecified, not intractable: Secondary | ICD-10-CM | POA: Diagnosis not present

## 2023-06-05 DIAGNOSIS — M2550 Pain in unspecified joint: Secondary | ICD-10-CM | POA: Diagnosis not present

## 2023-06-05 DIAGNOSIS — G629 Polyneuropathy, unspecified: Secondary | ICD-10-CM | POA: Diagnosis not present

## 2023-06-05 DIAGNOSIS — M542 Cervicalgia: Secondary | ICD-10-CM | POA: Diagnosis not present

## 2023-06-05 DIAGNOSIS — M51369 Other intervertebral disc degeneration, lumbar region without mention of lumbar back pain or lower extremity pain: Secondary | ICD-10-CM | POA: Diagnosis not present

## 2023-06-05 DIAGNOSIS — G894 Chronic pain syndrome: Secondary | ICD-10-CM | POA: Diagnosis not present

## 2023-06-24 ENCOUNTER — Ambulatory Visit: Attending: Family Medicine | Admitting: Physical Therapy

## 2023-06-24 ENCOUNTER — Encounter: Payer: Self-pay | Admitting: Physical Therapy

## 2023-06-24 DIAGNOSIS — R269 Unspecified abnormalities of gait and mobility: Secondary | ICD-10-CM

## 2023-06-24 DIAGNOSIS — M5459 Other low back pain: Secondary | ICD-10-CM

## 2023-06-24 DIAGNOSIS — R293 Abnormal posture: Secondary | ICD-10-CM | POA: Diagnosis not present

## 2023-06-24 DIAGNOSIS — M542 Cervicalgia: Secondary | ICD-10-CM | POA: Diagnosis not present

## 2023-06-24 DIAGNOSIS — M2560 Stiffness of unspecified joint, not elsewhere classified: Secondary | ICD-10-CM | POA: Diagnosis not present

## 2023-06-24 NOTE — Therapy (Signed)
 OUTPATIENT PHYSICAL THERAPY CERVICAL/LUMBAR EVALUATION   Patient Name: Richard Duncan MRN: [id number] DOB:Dec 21, 1950, 73 y.o., male Today's Date: 06/25/2023  END OF SESSION:  PT End of Session - 06/24/23 1517     Visit Number 1    Number of Visits 8    Date for PT Re-Evaluation 08/19/23    PT Start Time 1517    PT Stop Time 1601    PT Time Calculation (min) 44 min            Past Medical History:  Diagnosis Date   Anemia    Anxiety    Aortic atherosclerosis (HCC)    Arthritis    Arthropathia 11/08/2013   Bilateral carpal tunnel syndrome    CAD (coronary artery disease) 09/13/2011   a.) LHC 09/13/2014: EF 60%. 40% OM1, 40% LAD, 40% LCx, mRCA with minor luminal irregs - med mgmt.   Chronic pain syndrome    a.) followed by pain management; on buprenorphine/naloxone   CKD (chronic kidney disease), stage III (HCC)    Complete heart block (HCC) 05/2011   a.) s/p PPM placement 06/28/2011   Complication of anesthesia    a.) urinary retention with PROPRANOLOL and PROPOFOL ; has required urinary catheterization in the past   COPD (chronic obstructive pulmonary disease) (HCC)    DDD (degenerative disc disease), cervical    a.) s/p ACDF C5-C6; resulting/residual postoperative neck "stiffness"   Depression    Diastolic dysfunction 10/10/2016   a.) TTE 10/10/2016: EF >55%, triv MR/TR/PR, G1DD; b.) TTE 02/08/2020: EF >55%, mild LVH, triv MR/TR   Difficult intubation    a.) secondary to macroglossia + reduced cervical mobility (s/p cervical fusion)   Dyspnea    ED (erectile dysfunction)    a.) on PDE5i (tadalafil )   GERD (gastroesophageal reflux disease)    Hernia, hiatal    History of bilateral cataract extraction 2020   History of blood product transfusion    History of kidney stones    Hyperlipemia    Hypertension    Neuropathy involving both lower extremities    Obesity    OSA on CPAP    Pre-diabetes    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   a.)  Medtronic device placed 06/28/2011; b.) generator changed 03/08/2021   Prostate cancer (HCC) 2011   Prostatitis    PTSD (post-traumatic stress disorder)    Spinal cord stimulator status    a.) non-functional; "battery died"   Spondylolysis of thoracolumbar region    a.) s/p lumbar laminectomy x 2   Past Surgical History:  Procedure Laterality Date   ANTERIOR CERVICAL DECOMP/DISCECTOMY FUSION N/A 2008   Procedure: ANTERIOR CERVICAL DECOMP/DISCECTOMY FUSION (C5-C6)   APPENDECTOMY     CARDIAC PACEMAKER PLACEMENT  06/28/2011   Procedure: CARDIAC PACEMAKER PLACEMENT; Location: [facility]; Surgeon: Debborah Fairly, MD   CARPAL TUNNEL RELEASE Left 10/17/2021   Procedure: CARPAL TUNNEL RELEASE ENDOSCOPIC;  Surgeon: Elner Hahn, MD;  Location: [facility];  Service: Orthopedics;  Laterality: Left;   CARPAL TUNNEL RELEASE Right 03/06/2022   Procedure: CARPAL TUNNEL RELEASE ENDOSCOPIC;  Surgeon: Elner Hahn, MD;  Location: [facility];  Service: Orthopedics;  Laterality: Right;   CATARACT EXTRACTION W/PHACO Right 12/28/2018   Procedure: CATARACT EXTRACTION PHACO AND INTRAOCULAR LENS PLACEMENT (IOC) RIGHT 2.82  00:26.4;  Surgeon: Rosa College, MD;  Location: [facility];  Service: Ophthalmology;  Laterality: Right;   CATARACT EXTRACTION W/PHACO Left 01/25/2019   Procedure: CATARACT EXTRACTION PHACO AND INTRAOCULAR LENS PLACEMENT (IOC) LEFT;  Surgeon: Rosa College, MD;  Location: [facility];  Service: Ophthalmology;  Laterality: Left;  CDE 11.49 US  0:24.4   COLONOSCOPY     2006, 2015, 2021   COLONOSCOPY WITH PROPOFOL  N/A 06/25/2019   Procedure: COLONOSCOPY WITH PROPOFOL ;  Surgeon: Irby Mannan, MD;  Location: [facility];  Service: Endoscopy;  Laterality: N/A;  ALLERGIC TO PROPOFOL  PER OFFICE   COLONOSCOPY WITH PROPOFOL  N/A 10/28/2022   Procedure: COLONOSCOPY WITH PROPOFOL ;  Surgeon: Shane Darling, MD;  Location: [facility];  Service: Endoscopy;  Laterality: N/A;    CYSTOSCOPY W/ URETERAL STENT PLACEMENT Bilateral 11/01/2016   Procedure: CYSTOSCOPY WITH STENT REPLACEMENT;  Surgeon: Bart Born, MD;  Location: [facility];  Service: Urology;  Laterality: Bilateral;   CYSTOSCOPY W/ URETERAL STENT PLACEMENT Left 12/10/2016   Procedure: CYSTOSCOPY WITH STENT REPLACEMENT;  Surgeon: Geraline Knapp, MD;  Location: [facility];  Service: Urology;  Laterality: Left;   CYSTOSCOPY W/ URETERAL STENT REMOVAL Bilateral 11/27/2016   Procedure: CYSTOSCOPY WITH STENT REMOVAL/EXCHANGE;  Surgeon: Bart Born, MD;  Location: [facility];  Service: Urology;  Laterality: Bilateral;   CYSTOSCOPY WITH STENT PLACEMENT Bilateral 09/27/2016   Procedure: CYSTOSCOPY WITH STENT PLACEMENT;  Surgeon: Bart Born, MD;  Location: [facility];  Service: Urology;  Laterality: Bilateral;   EXCISION OF ABDOMINAL WALL TUMOR N/A 03/08/2016   Procedure: EXCISION OF ABDOMINAL WALL MASS;  Surgeon: Benancio Bracket, MD;  Location: [facility];  Service: General;  Laterality: N/A;   HOLEP-LASER ENUCLEATION OF THE PROSTATE WITH MORCELLATION N/A 09/25/2018   Procedure: HOLEP-LASER ENUCLEATION OF THE PROSTATE WITH MORCELLATION;  Surgeon: Lawerence Pressman, MD;  Location: [facility];  Service: Urology;  Laterality: N/A;   JOINT REPLACEMENT  2004, 2005, 2014, 2015   Bilat Hip Replacements   LEFT HEART CATH AND CORONARY ANGIOGRAPHY Left 09/13/2011   Procedure: LEFT HEART CATH AND CORONARY ANGIOGRAPHY; Location: [facility]; Surgeon: Debborah Fairly, MD   LUMBAR LAMINECTOMY  [phone]   LUMBAR LAMINECTOMY  2006   POLYPECTOMY  10/28/2022   Procedure: POLYPECTOMY;  Surgeon: Shane Darling, MD;  Location: [facility];  Service: Endoscopy;;   PPM GENERATOR CHANGEOUT N/A 03/08/2021   Procedure: PPM GENERATOR CHANGEOUT;  Surgeon: Percival Brace, MD;  Location: [facility];  Service: Cardiovascular;  Laterality: N/A;   SPINAL CORD STIMULATOR INSERTION N/A 07/08/2014   Procedure: LUMBAR SPINAL  CORD STIMULATOR INSERTION;  Surgeon: Gerri Kras, MD;  Location: [facility];  Service: Neurosurgery;  Laterality: N/A;  LUMBAR SPINAL CORD STIMULATOR INSERTION   TONSILLECTOMY     TRIGGER FINGER RELEASE Left 10/17/2021   Procedure: RELEASE TRIGGER FINGER/A-1 PULLEY;  Surgeon: Elner Hahn, MD;  Location: [facility];  Service: Orthopedics;  Laterality: Left;   TRIGGER FINGER RELEASE Right 03/06/2022   Procedure: RELEASE TRIGGER FINGER/A-1 PULLEY;  Surgeon: Elner Hahn, MD;  Location: [facility];  Service: Orthopedics;  Laterality: Right;   URETEROSCOPY WITH HOLMIUM LASER LITHOTRIPSY Bilateral 11/01/2016   Procedure: URETEROSCOPY WITH HOLMIUM LASER LITHOTRIPSY;  Surgeon: Bart Born, MD;  Location: [facility];  Service: Urology;  Laterality: Bilateral;   URETEROSCOPY WITH HOLMIUM LASER LITHOTRIPSY Left 12/10/2016   Procedure: URETEROSCOPY WITH HOLMIUM LASER LITHOTRIPSY;  Surgeon: Geraline Knapp, MD;  Location: [facility];  Service: Urology;  Laterality: Left;   URETEROSCOPY WITH HOLMIUM LASER LITHOTRIPSY Left 11/27/2016   Procedure: URETEROSCOPY WITH HOLMIUM LASER LITHOTRIPSY;  Surgeon: Bart Born, MD;  Location: [facility];  Service: Urology;  Laterality: Left;  VITRECTOMY Bilateral 08/22/2021   Patient Active Problem List   Diagnosis Date Noted   Leucocytosis 09/25/2022   History of colonic polyps    Polyp of transverse colon    Rectal polyp    COPD with acute exacerbation (HCC) 01/27/2018   Pain medication agreement signed 04/15/2017   Spondylosis of lumbar region without myelopathy or radiculopathy 01/20/2017   Neck pain 01/20/2017   Numbness and tingling 09/20/2016   Cervical spondylosis 08/07/2016   Numbness of upper extremity (Bilateral) 07/30/2016   Chronic cervical radiculopathy (Bilateral) 07/30/2016   Carpal tunnel syndrome (Bilateral) 07/30/2016   Cervical facet syndrome (Bilateral) 07/30/2016   Chronic shoulder pain (Bilateral) (L>R) 06/18/2016   Cervicogenic headache  (Bilateral) (L>R) 06/18/2016   Lumbar lateral recess stenosis (Bilateral: L2-3, L3-4, and L4-5) 06/18/2016   Lumbar facet syndrome (Bilateral) (R>L) 06/18/2016   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) 06/18/2016   Neurogenic pain 06/18/2016   Neuropathic pain 06/18/2016   Thoracic facet arthropathy (Bilateral: T7-8, T8-9, and T9-10) 06/18/2016   DDD (degenerative disc disease), lumbar 06/18/2016   DDD (degenerative disc disease), cervical 06/18/2016   DDD (degenerative disc disease), thoracic 06/18/2016   Neuropathy 04/16/2016   Elevated C-reactive protein (CRP) 04/10/2016   Chronic pain syndrome 03/11/2016   Chronic prescription benzodiazepine use 03/11/2016   Lower extremity neuropathy (Bilateral) 03/11/2016   Chronic hip pain (Primary Source of Pain) (Bilateral) (R>L) 03/11/2016   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   Chronic low back pain (Secondary source of pain) (Bilateral) (R>L) 03/11/2016   Chronic neck pain (Tertiary source of pain) (Midline) (Bilateral) 03/11/2016   Disturbance of skin sensation 03/11/2016   Lumbar facet arthropathy (Bilateral: L12, L2-3, L3-4, L4-5, L5-S1) 03/11/2016   Lumbar foraminal stenosis (Bilateral: L2-3, L3-4, and L4-5) 03/11/2016   Thoracic foraminal stenosis (Right: T8-9 and T9-10) 03/11/2016   Lumbar spondylosis 03/11/2016   Osteoarthritis 03/11/2016   Thoracic spondylosis 03/11/2016   Medtronic spinal cord stimulator 03/11/2016   Acquired heart block 12/07/2015   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   Lower urinary tract symptoms 09/13/2014   Anxiety 06/18/2014   Cardiac disease 06/18/2014   Hyperlipidemia, unspecified 06/18/2014   BP (high blood pressure) 06/18/2014   Prostate cancer (HCC) 06/18/2014   ED (erectile dysfunction) 06/18/2014   Prostatitis 06/18/2014   Obesity 06/18/2014   Urinary retention 06/18/2014   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) 07/12/2013   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 Has been on Morphine  for years and reports noting is helping pain.  Pt. States he used to weigh 199# and currently weighs 338#.  Pt. States he has been less active and has had PT in past.  Pt. States nothing helps pain and activity is limited by pain.  Pt. Was going to [employer] in past but not recently (10 years ago).  Pt. Reports having 22 surgeries in past and states he is done having surgery.  Pt. Lives with wife and has 1 step to enter house.  Pt. Using rollator outside of house and [facility] in house.   Hand dominance: Right  PERTINENT HISTORY:  Extensive PMHx.    PAIN:  Are you having pain? Yes: NPRS scale: 8/10 Pain location: low back  Pain description: "hot knife" Aggravating factors: increase activity Relieving factors: medication/ sitting in recliner (neuropathy in legs).    PRECAUTIONS: Back  RED FLAGS: None   WEIGHT BEARING RESTRICTIONS: No  FALLS:  Has patient fallen in last 6 months? Yes. Number of falls 1 fall out of bed with R leg injury (getting out of bed to go to bathroom at night).     LIVING ENVIRONMENT: Lives with: lives with their spouse Lives in: House/apartment Stairs: No Has following equipment at home: None  OCCUPATION: retired   PLOF: Requires assistive device for independence  PATIENT GOALS: Get an exercise program.    NEXT MD VISIT: PRN  OBJECTIVE:  Note: Objective measures were completed at Evaluation unless otherwise noted.  DIAGNOSTIC FINDINGS:  CLINICAL DATA:  Right-sided neck and right greater than left shoulder pain, numbness and tingling in right hand   EXAM: CT CERVICAL SPINE WITHOUT CONTRAST   TECHNIQUE: Multidetector CT imaging of the cervical spine was performed without intravenous contrast. Multiplanar CT image reconstructions were also generated.   RADIATION DOSE REDUCTION: This exam was performed according to the departmental dose-optimization program which includes automated exposure control, adjustment of the mA and/or kV according to patient size and/or use of iterative reconstruction technique.   COMPARISON:  07/18/2016   FINDINGS: Alignment: Straightening and reversal of the normal cervical lordosis. Trace anterolisthesis of C4 on C5, which is new from the prior exam.   Skull base and vertebrae: Evaluation of the lower cervical spine is limited by artifact related to overlapping soft tissues. No acute fracture or suspicious osseous lesion. Redemonstrated postsurgical changes at C5-C6, with solid osseous fusion. There also appears to be solid osseous fusion across the C6-C7 disc space.   Soft tissues and spinal canal: No prevertebral fluid or swelling. No visible canal hematoma.   Disc levels:   C2-C3: No significant disc bulge. Left-greater-than-right facet arthropathy, with significant left uncovertebral hypertrophy, which is new from the prior exam. No spinal canal stenosis. Moderate to severe left neural foraminal narrowing, new from the prior.   C3-C4: Disc height loss and disc osteophyte complex. Facet  and uncovertebral hypertrophy. Moderate spinal canal stenosis and severe bilateral neural foraminal narrowing, which have progressed since the prior exam.   C4-C5: Trace anterolisthesis and mild disc bulge. Right greater than left facet and uncovertebral hypertrophy. No spinal canal stenosis. Moderate right and mild left neural foraminal narrowing, which have progressed from the prior exam.   C5-C6: Status post fusion. Uncovertebral hypertrophy. No spinal canal stenosis. Right-greater-than-left mild neural foraminal narrowing, which appears similar to the prior exam.   C6-C7: Disc height loss with likely osseous fusion across the disc space, with a possible left paracentral disc protrusion. Left-greater-than-right  uncovertebral and facet arthropathy. Mild-to-moderate spinal canal stenosis and moderate left neural foraminal narrowing, which have progressed from prior exam.   C7-T1: Disc height loss and disc osteophyte complex with possible left paracentral disc protrusion, which appear new from the prior exam. Left-greater-than-right facet and uncovertebral hypertrophy. Mild spinal canal stenosis and mild left neural foraminal narrowing, which are new from the prior exam.   Upper chest: Negative.   Other: None.   IMPRESSION: 1. C3-C4 moderate spinal canal stenosis and severe bilateral neural foraminal narrowing, which have progressed from the prior exam. 2. C6-C7 mild to moderate spinal canal stenosis and moderate left neural foraminal narrowing, which have progressed from the prior exam. 3. C7-T1 mild spinal canal stenosis and mild left neural foraminal narrowing, which have progressed from the prior exam. 4. Additional progressive neural foraminal narrowing, which is moderate to severe at C2-C3 on the left, moderate on the right at C4-C5, and mild on the left C4-C5. 5. Unchanged mild neural foraminal narrowing at C5-C6.     Electronically Signed   By: Zoila Hines  M.D.   On: 05/16/2022 12:18  PATIENT SURVEYS:  Modified Oswestry 68% self-perceived extreme disability    LEFS: 10 out of 80  COGNITION: Overall cognitive status: Within functional limits for tasks assessed  SENSATION: Not tested  POSTURE: rounded shoulders and forward head  PALPATION: Not assessed at time of evaluation     CERVICAL ROM:    Cervical AROM limited all planes of movement secondary joint stiffness/ pain.  Forward head/ rounded shoulder posture.  Limited cervical extension (75% limited)/ rotn (50% limited), lat. Flexion (50%).    UPPER EXTREMITY ROM:  B shoulder AROM WFL with flexion/ pain limited with sh. Abduction.   UPPER EXTREMITY MMT:  MMT Right eval Left eval  Shoulder flexion 5 5  Shoulder extension    Shoulder abduction 5 5  Shoulder adduction    Shoulder extension    Shoulder internal rotation    Shoulder external rotation    Middle trapezius    Lower trapezius    Elbow flexion 5 5  Elbow extension 5 5  Wrist flexion    Wrist extension    Wrist ulnar deviation    Wrist radial deviation    Wrist pronation    Wrist supination    Grip strength     (Blank rows = not tested)  CERVICAL SPECIAL TESTS:  NT  FUNCTIONAL TESTS:  Pt. Ambulates with forward flexed posture and use of rollator.    TREATMENT DATE: 06/24/23  See evaluation/  emailed HEP                                                                                                                               Walking in //-bars with short stride length (marked increase in hip/back pain, esp. Without use of UE on //-bars).  Forward/lateral walking.  O2 sat. 95%, HR 88 bpm.    STS: unable to  stand without UE assist.  Has power lift chair at home.    Tandem stance (<10 sec.)- balance issues.    PATIENT EDUCATION:  Education details: Emailed HEP Person educated: Patient Education method: Handouts Education comprehension: needs further education  HOME EXERCISE PROGRAM: Access  Code: [email or web address] URL: [email or web address] Date: 06/24/2023 Prepared by: Hazeline Lister  Exercises - Mini Squat with Chair  - 1 x daily - 7 x weekly - 2 sets - 10 reps - Standing March with Unilateral Counter Support  - 1 x daily - 7 x weekly - 2 sets - 10 reps - Standing Hip Abduction with Unilateral Counter Support  - 1 x daily - 7 x weekly - 2 sets - 10 reps - Seated Thoracic Extension Arms Overhead  - 1 x daily - 7 x weekly - 1 sets - 10 reps - Seated Sidebending Arms Overhead  - 1 x daily - 7 x weekly - 1 sets - 10 reps - Supine Lower Trunk Rotation  - 1 x daily - 7 x weekly - 1 sets - 5 reps  ASSESSMENT:  CLINICAL IMPRESSION: Patient is a 73 y.o. male who was seen today for physical therapy evaluation and treatment for chronic pain (neck/back/LE).  Pt. Reports persistent pain in multiple areas and c/o 8/10 low back pain that "feels like a hot knife".  Pt. Reports being limited with all activity secondary to pain/ conditioning/ shortness of breath.  Pt. Has had PT if past with limited improvement and reports financial limitations with having PT currently.  Pt. Interested in a home exercise program to be performed independently.  PT instructed pt. To reach out with any questions or concerns.  No additional skilled PT services at this time.  Pt. Would benefit from returning to [employer] if appropriate.    OBJECTIVE IMPAIRMENTS: Abnormal gait, decreased activity tolerance, decreased balance, decreased coordination, decreased endurance, decreased mobility, difficulty walking, decreased ROM, decreased strength, hypomobility, impaired flexibility, impaired sensation, improper body mechanics, postural dysfunction, obesity, and pain.   ACTIVITY LIMITATIONS: carrying, lifting, bending, sitting, standing, squatting, sleeping, stairs, transfers, bed mobility, toileting, dressing, and locomotion level  PARTICIPATION LIMITATIONS: cleaning, laundry, shopping, and community  activity  PERSONAL FACTORS: Fitness and Past/current experiences are also affecting patient's functional outcome.   REHAB POTENTIAL: Poor high pain levels/ limited cardiac and pulmonary/ financial limitations  CLINICAL DECISION MAKING: Unstable/unpredictable  EVALUATION COMPLEXITY: High   GOALS: Goals reviewed with patient? Yes  LONG TERM GOALS: Target date: 08/19/23  Pt. Independent with HEP to increase generalized strengthening to improve standing/walking with no increase c/o pain.   Baseline:   currently not exercising Goal status: INITIAL  PLAN:  PT FREQUENCY: 1x/week  PT DURATION: 8 weeks  PLANNED INTERVENTIONS: 97110-Therapeutic exercises, 97530- Therapeutic activity, [id number]- Neuromuscular re-education, 97535- Self Care, and 86578- Manual therapy  PLAN FOR NEXT SESSION: Pt. Instructed to reach out to PT if any questions or wanting progression of HEP  Lendell Quarry, PT, DPT # [phone] 06/25/2023, 6:30 PM

## 2023-06-25 DIAGNOSIS — L281 Prurigo nodularis: Secondary | ICD-10-CM | POA: Diagnosis not present

## 2023-06-25 DIAGNOSIS — D1801 Hemangioma of skin and subcutaneous tissue: Secondary | ICD-10-CM | POA: Diagnosis not present

## 2023-06-26 ENCOUNTER — Ambulatory Visit: Admitting: Physical Therapy

## 2023-07-01 ENCOUNTER — Encounter: Admitting: Physical Therapy

## 2023-07-02 DIAGNOSIS — F419 Anxiety disorder, unspecified: Secondary | ICD-10-CM | POA: Diagnosis not present

## 2023-07-02 DIAGNOSIS — I10 Essential (primary) hypertension: Secondary | ICD-10-CM | POA: Diagnosis not present

## 2023-07-02 DIAGNOSIS — Z6841 Body Mass Index (BMI) 40.0 and over, adult: Secondary | ICD-10-CM | POA: Diagnosis not present

## 2023-07-02 DIAGNOSIS — E782 Mixed hyperlipidemia: Secondary | ICD-10-CM | POA: Diagnosis not present

## 2023-07-02 DIAGNOSIS — J449 Chronic obstructive pulmonary disease, unspecified: Secondary | ICD-10-CM | POA: Diagnosis not present

## 2023-07-02 DIAGNOSIS — G4733 Obstructive sleep apnea (adult) (pediatric): Secondary | ICD-10-CM | POA: Diagnosis not present

## 2023-07-02 DIAGNOSIS — R7302 Impaired glucose tolerance (oral): Secondary | ICD-10-CM | POA: Diagnosis not present

## 2023-07-02 DIAGNOSIS — G894 Chronic pain syndrome: Secondary | ICD-10-CM | POA: Diagnosis not present

## 2023-07-02 DIAGNOSIS — Z0000 Encounter for general adult medical examination without abnormal findings: Secondary | ICD-10-CM | POA: Diagnosis not present

## 2023-07-02 DIAGNOSIS — N182 Chronic kidney disease, stage 2 (mild): Secondary | ICD-10-CM | POA: Diagnosis not present

## 2023-07-02 DIAGNOSIS — C61 Malignant neoplasm of prostate: Secondary | ICD-10-CM | POA: Diagnosis not present

## 2023-07-02 DIAGNOSIS — M129 Arthropathy, unspecified: Secondary | ICD-10-CM | POA: Diagnosis not present

## 2023-07-03 ENCOUNTER — Encounter: Admitting: Physical Therapy

## 2023-07-03 DIAGNOSIS — J301 Allergic rhinitis due to pollen: Secondary | ICD-10-CM | POA: Diagnosis not present

## 2023-07-03 DIAGNOSIS — G4733 Obstructive sleep apnea (adult) (pediatric): Secondary | ICD-10-CM | POA: Diagnosis not present

## 2023-07-03 DIAGNOSIS — E66813 Obesity, class 3: Secondary | ICD-10-CM | POA: Diagnosis not present

## 2023-07-03 DIAGNOSIS — J439 Emphysema, unspecified: Secondary | ICD-10-CM | POA: Diagnosis not present

## 2023-07-03 DIAGNOSIS — Z6841 Body Mass Index (BMI) 40.0 and over, adult: Secondary | ICD-10-CM | POA: Diagnosis not present

## 2023-07-08 ENCOUNTER — Encounter: Admitting: Physical Therapy

## 2023-07-09 ENCOUNTER — Encounter: Admitting: Physical Therapy

## 2023-07-09 DIAGNOSIS — G629 Polyneuropathy, unspecified: Secondary | ICD-10-CM | POA: Diagnosis not present

## 2023-07-09 DIAGNOSIS — G44209 Tension-type headache, unspecified, not intractable: Secondary | ICD-10-CM | POA: Diagnosis not present

## 2023-07-10 ENCOUNTER — Encounter: Admitting: Physical Therapy

## 2023-07-15 ENCOUNTER — Encounter: Admitting: Physical Therapy

## 2023-07-17 ENCOUNTER — Encounter: Admitting: Physical Therapy

## 2023-07-22 ENCOUNTER — Encounter: Admitting: Physical Therapy

## 2023-07-24 ENCOUNTER — Encounter: Admitting: Physical Therapy

## 2023-07-29 ENCOUNTER — Encounter: Admitting: Physical Therapy

## 2023-07-31 ENCOUNTER — Encounter: Admitting: Physical Therapy

## 2023-08-05 ENCOUNTER — Encounter: Admitting: Physical Therapy

## 2023-08-07 ENCOUNTER — Encounter: Admitting: Physical Therapy

## 2023-08-25 DIAGNOSIS — N200 Calculus of kidney: Secondary | ICD-10-CM | POA: Diagnosis not present

## 2023-08-25 DIAGNOSIS — N182 Chronic kidney disease, stage 2 (mild): Secondary | ICD-10-CM | POA: Diagnosis not present

## 2023-08-25 DIAGNOSIS — I10 Essential (primary) hypertension: Secondary | ICD-10-CM | POA: Diagnosis not present

## 2023-08-25 DIAGNOSIS — N2581 Secondary hyperparathyroidism of renal origin: Secondary | ICD-10-CM | POA: Diagnosis not present

## 2023-09-04 ENCOUNTER — Encounter: Payer: Self-pay | Admitting: Urology

## 2023-09-19 DIAGNOSIS — J301 Allergic rhinitis due to pollen: Secondary | ICD-10-CM | POA: Diagnosis not present

## 2023-09-19 DIAGNOSIS — E66813 Obesity, class 3: Secondary | ICD-10-CM | POA: Diagnosis not present

## 2023-09-19 DIAGNOSIS — J439 Emphysema, unspecified: Secondary | ICD-10-CM | POA: Diagnosis not present

## 2023-09-19 DIAGNOSIS — G4733 Obstructive sleep apnea (adult) (pediatric): Secondary | ICD-10-CM | POA: Diagnosis not present

## 2023-09-19 DIAGNOSIS — Z6841 Body Mass Index (BMI) 40.0 and over, adult: Secondary | ICD-10-CM | POA: Diagnosis not present

## 2023-10-01 DIAGNOSIS — I10 Essential (primary) hypertension: Secondary | ICD-10-CM | POA: Diagnosis not present

## 2023-10-01 DIAGNOSIS — Z950 Presence of cardiac pacemaker: Secondary | ICD-10-CM | POA: Diagnosis not present

## 2023-10-01 DIAGNOSIS — E782 Mixed hyperlipidemia: Secondary | ICD-10-CM | POA: Diagnosis not present

## 2023-10-01 DIAGNOSIS — I495 Sick sinus syndrome: Secondary | ICD-10-CM | POA: Diagnosis not present

## 2023-10-20 ENCOUNTER — Other Ambulatory Visit: Payer: Self-pay

## 2023-10-20 ENCOUNTER — Emergency Department

## 2023-10-20 ENCOUNTER — Ambulatory Visit
Admission: EM | Admit: 2023-10-20 | Discharge: 2023-10-20 | Discharge status: Against Medical Advice | Attending: Emergency Medicine | Admitting: Emergency Medicine

## 2023-10-20 ENCOUNTER — Emergency Department
Admission: EM | Admit: 2023-10-20 | Discharge: 2023-10-20 | Disposition: A | Discharge status: Home / Self Care | Attending: Emergency Medicine | Admitting: Emergency Medicine

## 2023-10-20 DIAGNOSIS — D72829 Elevated white blood cell count, unspecified: Secondary | ICD-10-CM | POA: Insufficient documentation

## 2023-10-20 DIAGNOSIS — R1084 Generalized abdominal pain: Secondary | ICD-10-CM | POA: Insufficient documentation

## 2023-10-20 DIAGNOSIS — N390 Urinary tract infection, site not specified: Secondary | ICD-10-CM | POA: Diagnosis not present

## 2023-10-20 LAB — COMPREHENSIVE METABOLIC PANEL WITH GFR
ALT: 26 U/L (ref 0–44)
AST: 29 U/L (ref 15–41)
Albumin: 3.8 g/dL (ref 3.5–5.0)
Alkaline Phosphatase: 71 U/L (ref 38–126)
Anion gap: 12 (ref 5–15)
BUN: 20 mg/dL (ref 8–23)
CO2: 23 mmol/L (ref 22–32)
Calcium: 9.2 mg/dL (ref 8.9–10.3)
Chloride: 103 mmol/L (ref 98–111)
Creatinine, Ser: 1.41 mg/dL — ABNORMAL HIGH (ref 0.61–1.24)
GFR, Estimated: 53 mL/min — ABNORMAL LOW (ref >60)
Glucose, Bld: 127 mg/dL — ABNORMAL HIGH (ref 70–99)
Potassium: 4.2 mmol/L (ref 3.5–5.1)
Sodium: 138 mmol/L (ref 135–145)
Total Bilirubin: 1.5 mg/dL — ABNORMAL HIGH (ref 0.0–1.2)
Total Protein: 7.7 g/dL (ref 6.5–8.1)

## 2023-10-20 LAB — CBC
HCT: 47.4 % (ref 39.0–52.0)
Hemoglobin: 16.1 g/dL (ref 13.0–17.0)
MCH: 30.8 pg (ref 26.0–34.0)
MCHC: 34 g/dL (ref 30.0–36.0)
MCV: 90.6 fL (ref 80.0–100.0)
Platelets: 232 K/uL (ref 150–400)
RBC: 5.23 MIL/uL (ref 4.22–5.81)
RDW: 12.4 % (ref 11.5–15.5)
WBC: 23.2 K/uL — ABNORMAL HIGH (ref 4.0–10.5)
nRBC: 0 % (ref 0.0–0.2)

## 2023-10-20 LAB — URINALYSIS, W/ REFLEX TO CULTURE (INFECTION SUSPECTED)
Bilirubin Urine: NEGATIVE
Glucose, UA: NEGATIVE mg/dL
Ketones, ur: NEGATIVE mg/dL
Nitrite: POSITIVE — AB
Protein, ur: 100 mg/dL — AB
RBC / HPF: >50 RBC/hpf (ref 0–5)
Specific Gravity, Urine: >1.046 — ABNORMAL HIGH (ref 1.005–1.030)
WBC, UA: >50 WBC/hpf (ref 0–5)
pH: 8 (ref 5.0–8.0)

## 2023-10-20 LAB — LACTIC ACID, PLASMA: Lactic Acid, Venous: 1.5 mmol/L (ref 0.5–1.9)

## 2023-10-20 LAB — LIPASE, BLOOD: Lipase: 29 U/L (ref 11–51)

## 2023-10-20 MED ORDER — MORPHINE SULFATE (PF) 4 MG/ML IV SOLN
8.0000 mg | Freq: Once | INTRAVENOUS | Status: AC
  Administered 2023-10-20: 8 mg via INTRAVENOUS
  Filled 2023-10-20: qty 2

## 2023-10-20 MED ORDER — ONDANSETRON HCL 4 MG/2ML IJ SOLN
4.0000 mg | Freq: Once | INTRAMUSCULAR | Status: AC
  Administered 2023-10-20: 4 mg via INTRAVENOUS
  Filled 2023-10-20: qty 2

## 2023-10-20 MED ORDER — SODIUM CHLORIDE 0.9 % IV BOLUS
1000.0000 mL | Freq: Once | INTRAVENOUS | Status: AC
  Administered 2023-10-20: 1000 mL via INTRAVENOUS

## 2023-10-20 MED ORDER — CEPHALEXIN 500 MG PO CAPS: 500.0000 mg | ORAL_CAPSULE | Freq: Three times a day (TID) | ORAL | 0 refills | Status: DC

## 2023-10-20 MED ORDER — SODIUM CHLORIDE 0.9 % IV SOLN
1.0000 g | Freq: Once | INTRAVENOUS | Status: AC
  Administered 2023-10-20: 1 g via INTRAVENOUS
  Filled 2023-10-20: qty 10

## 2023-10-20 MED ORDER — IOHEXOL 300 MG/ML  SOLN
100.0000 mL | Freq: Once | INTRAMUSCULAR | Status: AC | PRN
  Administered 2023-10-20: 100 mL via INTRAVENOUS

## 2023-10-20 NOTE — ED Triage Notes (Signed)
 Pt to ED via POV from home. Pt reports right lower abd/groin pain and pain/difficulty urinating. Hx of kidney stones and appendix removed.

## 2023-10-20 NOTE — ED Provider Notes (Signed)
 [facility] Provider Note    Event Date/Time   First MD Initiated Contact with Patient 10/20/23 1322     (approximate)   History   Abdominal Pain   HPI  Richard Duncan is a 73 y.o. male past medical history significant for obesity, history of prostatitis, presents to the emergency department with abdominal pain and dysuria.  States that he feels like he is having another urinary tract or prostatitis episode.  Denies any fever or chills.  Complaining of lower abdominal pain and flank pain.  Associated with nausea but no episodes of vomiting.  States that he had a history of kidney stones in the past but this does not feel similar to prior episodes of kidney stone.  No falls or trauma.  No cough or shortness of breath.  No chest pain.  No recent antibiotic use.  No recent steroid use.     Physical Exam   Triage Vital Signs: ED Triage Vitals  Encounter Vitals Group     BP 10/20/23 1049 109/81     Girls Systolic BP Percentile --      Girls Diastolic BP Percentile --      Boys Systolic BP Percentile --      Boys Diastolic BP Percentile --      Pulse Rate 10/20/23 1049 (!) 104     Resp 10/20/23 1049 18     Temp 10/20/23 1049 98.1 F (36.7 C)     Temp Source 10/20/23 1049 Oral     SpO2 10/20/23 1049 95 %     Weight --      Height --      Head Circumference --      Peak Flow --      Pain Score 10/20/23 1047 8     Pain Loc --      Pain Education --      Exclude from Growth Chart --     Most recent vital signs: Vitals:   10/20/23 1049 10/20/23 1353  BP: 109/81 135/77  Pulse: (!) 104 100  Resp: 18 17  Temp: 98.1 F (36.7 C) 98.7 F (37.1 C)  SpO2: 95% 94%    Physical Exam Constitutional:      Appearance: He is well-developed. He is obese.  HENT:     Head: Atraumatic.  Eyes:     Conjunctiva/sclera: Conjunctivae normal.  Cardiovascular:     Rate and Rhythm: Regular rhythm.  Pulmonary:     Effort: No respiratory distress.  Abdominal:      Palpations: Abdomen is soft.     Tenderness: There is abdominal tenderness. There is right CVA tenderness and left CVA tenderness.  Musculoskeletal:     Cervical back: Normal range of motion.  Skin:    General: Skin is warm.     Capillary Refill: Capillary refill takes less than 2 seconds.  Neurological:     General: No focal deficit present.     Mental Status: He is alert. Mental status is at baseline.     IMPRESSION / MDM / ASSESSMENT AND PLAN / ED COURSE  I reviewed the triage vital signs and the nursing notes.  Differential diagnosis including prostatitis, urinary tract infection, pyelonephritis, kidney stone, intra-abdominal abscess, dehydration, urinary retention    RADIOLOGY I independently reviewed imaging, my interpretation of imaging: CT scan abdomen and pelvis without signs of hydronephrosis.  Read as no acute findings.  Chronic stable and incidental findings noted.  LABS (all labs ordered are listed,  but only abnormal results are displayed) Labs interpreted as -    Labs Reviewed  COMPREHENSIVE METABOLIC PANEL WITH GFR - Abnormal; Notable for the following components:      Result Value   Glucose, Bld 127 (*)    Creatinine, Ser 1.41 (*)    Total Bilirubin 1.5 (*)    GFR, Estimated 53 (*)    All other components within normal limits  CBC - Abnormal; Notable for the following components:   WBC 23.2 (*)    All other components within normal limits  URINALYSIS, W/ REFLEX TO CULTURE (INFECTION SUSPECTED) - Abnormal; Notable for the following components:   Color, Urine YELLOW (*)    APPearance CLOUDY (*)    Specific Gravity, Urine >1.046 (*)    Hgb urine dipstick MODERATE (*)    Protein, ur 100 (*)    Nitrite POSITIVE (*)    Leukocytes,Ua LARGE (*)    Bacteria, UA MANY (*)    All other components within normal limits  URINE CULTURE  LIPASE, BLOOD  LACTIC ACID, PLASMA  LACTIC ACID, PLASMA     MDM    Patient was significant to leukocytosis of 23.2.   Creatinine at baseline with no significant electrolyte abnormalities.  UA currently in process.  Given IV fluids and pain medication.  On reevaluation continues to pain but does state that he has improvement.  Does take chronic opioids.  Given another dose of pain medication.  Will add on a lactic acid.  Care transferred to oncoming provider with UA in process.  On chart review no history of resistant urinary tract infection.   PROCEDURES:  Critical Care performed: No  Procedures  Patient's presentation is most consistent with acute presentation with potential threat to life or bodily function.   MEDICATIONS ORDERED IN ED: Medications  morphine  (PF) 4 MG/ML injection 8 mg (has no administration in time range)  sodium chloride  0.9 % bolus 1,000 mL (1,000 mLs Intravenous New Bag/Given 10/20/23 1359)  ondansetron  (ZOFRAN ) injection 4 mg (4 mg Intravenous Given 10/20/23 1400)  morphine  (PF) 4 MG/ML injection 8 mg (8 mg Intravenous Given 10/20/23 1403)  iohexol  (OMNIPAQUE ) 300 MG/ML solution 100 mL (100 mLs Intravenous Contrast Given 10/20/23 1415)    FINAL CLINICAL IMPRESSION(S) / ED DIAGNOSES   Final diagnoses:  Generalized abdominal pain     Rx / DC Orders   ED Discharge Orders     None        Note:  This document was prepared using Dragon voice recognition software and may include unintentional dictation errors.   Suzanne Kirsch, MD 10/20/23 1515

## 2023-10-20 NOTE — ED Provider Notes (Signed)
 CT scan without concerning abnormality. Lactic acid normal. Discussed findings with patient. Will give dose of IV abx here in the emergency department. Patient was comfortable with discharge home afterwards which I think is reasonable. Did discuss strict return precautions.    Floy Roberts, MD 10/20/23 [phone]

## 2023-10-20 NOTE — ED Notes (Signed)
 Pt LWBS during triage. Was req a catheter, informed pt that catheters were not performed here in clinic. Pt stated would f/u with urology or ED.

## 2023-10-22 LAB — URINE CULTURE: Culture: >=100000 — AB

## 2023-10-29 DIAGNOSIS — G44209 Tension-type headache, unspecified, not intractable: Secondary | ICD-10-CM | POA: Diagnosis not present

## 2023-10-29 DIAGNOSIS — R202 Paresthesia of skin: Secondary | ICD-10-CM | POA: Diagnosis not present

## 2023-10-29 DIAGNOSIS — R200 Anesthesia of skin: Secondary | ICD-10-CM | POA: Diagnosis not present

## 2023-10-29 DIAGNOSIS — G629 Polyneuropathy, unspecified: Secondary | ICD-10-CM | POA: Diagnosis not present

## 2023-11-02 ENCOUNTER — Telehealth: Admitting: Family

## 2023-11-02 DIAGNOSIS — N390 Urinary tract infection, site not specified: Secondary | ICD-10-CM | POA: Diagnosis not present

## 2023-11-02 DIAGNOSIS — Z09 Encounter for follow-up examination after completed treatment for conditions other than malignant neoplasm: Secondary | ICD-10-CM | POA: Diagnosis not present

## 2023-11-02 MED ORDER — CIPROFLOXACIN HCL 500 MG PO TABS: 500.0000 mg | ORAL_TABLET | Freq: Two times a day (BID) | ORAL | 0 refills | Status: DC

## 2023-11-02 NOTE — Progress Notes (Signed)
 Virtual Visit Consent   Richard Duncan, you are scheduled for a virtual visit with a [facility] provider today. Just as with appointments in the office, your consent must be obtained to participate. Your consent will be active for this visit and any virtual visit you may have with one of our providers in the next 365 days. If you have a MyChart account, a copy of this consent can be sent to you electronically.  As this is a virtual visit, video technology does not allow for your provider to perform a traditional examination. This may limit your provider's ability to fully assess your condition. If your provider identifies any concerns that need to be evaluated in person or the need to arrange testing (such as labs, EKG, etc.), we will make arrangements to do so. Although advances in technology are sophisticated, we cannot ensure that it will always work on either your end or our end. If the connection with a video visit is poor, the visit may have to be switched to a telephone visit. With either a video or telephone visit, we are not always able to ensure that we have a secure connection.  By engaging in this virtual visit, you consent to the provision of healthcare and authorize for your insurance to be billed (if applicable) for the services provided during this visit. Depending on your insurance coverage, you may receive a charge related to this service.  I need to obtain your verbal consent now. Are you willing to proceed with your visit today? Richard Duncan has provided verbal consent on 11/02/2023 for a virtual visit (video or telephone). Bari Learn, FNP  Date: 11/02/2023 7:37 PM   Virtual Visit via Video Note   I, Bari Learn, connected with  Richard Duncan  ([id number], 09-11-1950) on 11/02/23 at  7:45 PM EDT by a video-enabled telemedicine application and verified that I am speaking with the correct person using two identifiers.  Location: Patient: Virtual Visit Location  Patient: Home Provider: Virtual Visit Location Provider: Home Office   I discussed the limitations of evaluation and management by telemedicine and the availability of in person appointments. The patient expressed understanding and agreed to proceed.    History of Present Illness: Richard Duncan is a 73 y.o. who identifies as a male who was assigned male at birth, and is being seen today for recurrent UTI symptoms. He was seen in the ED on 10/20/23. He had a positive Urine culture and was given Keflex . He completed the antibiotic 2 days ago and now is having urinary urgency and dysuria.   HPI: Dysuria  This is a recurrent problem. The current episode started in the past 7 days. The problem has been rapidly improving. The quality of the pain is described as burning. The pain is at a severity of 7/10. The pain is mild. Associated symptoms include chills, frequency, hesitancy and urgency. Pertinent negatives include no flank pain, hematuria, nausea or vomiting. He has tried antibiotics and increased fluids for the symptoms. The treatment provided mild relief.    Problems:  Patient Active Problem List   Diagnosis Date Noted   Leucocytosis 09/25/2022   History of colonic polyps    Polyp of transverse colon    Rectal polyp    COPD with acute exacerbation (HCC) 01/27/2018   Pain medication agreement signed 04/15/2017   Spondylosis of lumbar region without myelopathy or radiculopathy 01/20/2017   Neck pain 01/20/2017   Numbness and tingling 09/20/2016   Cervical spondylosis  08/07/2016   Numbness of upper extremity (Bilateral) 07/30/2016   Chronic cervical radiculopathy (Bilateral) 07/30/2016   Carpal tunnel syndrome (Bilateral) 07/30/2016   Cervical facet syndrome (Bilateral) 07/30/2016   Chronic shoulder pain (Bilateral) (L>R) 06/18/2016   Cervicogenic headache (Bilateral) (L>R) 06/18/2016   Lumbar lateral recess stenosis (Bilateral: L2-3, L3-4, and L4-5) 06/18/2016   Lumbar facet syndrome  (Bilateral) (R>L) 06/18/2016   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) 06/18/2016   Neurogenic pain 06/18/2016   Neuropathic pain 06/18/2016   Thoracic facet arthropathy (Bilateral: T7-8, T8-9, and T9-10) 06/18/2016   DDD (degenerative disc disease), lumbar 06/18/2016   DDD (degenerative disc disease), cervical 06/18/2016   DDD (degenerative disc disease), thoracic 06/18/2016   Neuropathy 04/16/2016   Elevated C-reactive protein (CRP) 04/10/2016   Chronic pain syndrome 03/11/2016   Chronic prescription benzodiazepine use 03/11/2016   Lower extremity neuropathy (Bilateral) 03/11/2016   Chronic hip pain (Primary Source of Pain) (Bilateral) (R>L) 03/11/2016   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   Chronic low back pain (Secondary source of pain) (Bilateral) (R>L) 03/11/2016   Chronic neck pain (Tertiary source of pain) (Midline) (Bilateral) 03/11/2016   Disturbance of skin sensation 03/11/2016   Lumbar facet arthropathy (Bilateral: L12, L2-3, L3-4, L4-5, L5-S1) 03/11/2016   Lumbar foraminal stenosis (Bilateral: L2-3, L3-4, and L4-5) 03/11/2016   Thoracic foraminal stenosis (Right: T8-9 and T9-10) 03/11/2016   Lumbar spondylosis 03/11/2016   Osteoarthritis 03/11/2016   Thoracic spondylosis 03/11/2016   [employer]tronic spinal cord stimulator 03/11/2016   Acquired heart block 12/07/2015   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   Lower urinary tract symptoms 09/13/2014   Anxiety 06/18/2014   Cardiac disease 06/18/2014   Hyperlipidemia, unspecified 06/18/2014   BP (high blood pressure) 06/18/2014   Prostate cancer (HCC) 06/18/2014   ED (erectile dysfunction) 06/18/2014   Prostatitis 06/18/2014   Obesity 06/18/2014   Urinary retention 06/18/2014   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/12/2013    Allergies:  Allergies  Allergen  Reactions   Other     Steroid eye drop caused eye irritation   Propofol  Other (See Comments)    URINARY RETENTION REQUIRING CATHETERIZATION   Propranolol Other (See Comments)    Urinary retention   Medications:  Current Outpatient Medications:    ciprofloxacin  (CIPRO ) 500 MG tablet, Take 1 tablet (500 mg total) by mouth 2 (two) times daily for 7 days., Disp: 14 tablet, Rfl: 0   albuterol (VENTOLIN HFA) 108 (90 Base) MCG/ACT inhaler, Inhale 2 inhalations into the lungs every 6 (six) hours as needed for Wheezing, Disp: , Rfl:    amLODipine (NORVASC) 10 MG tablet, Take 10 mg by mouth in the morning., Disp: , Rfl:    atorvastatin (LIPITOR) 20 MG tablet, Take 20 mg by mouth in the morning., Disp: , Rfl:    Baclofen 5 MG TABS, Take by mouth 3 (three) times daily., Disp: , Rfl:    budesonide-formoterol (SYMBICORT) 160-4.5 MCG/ACT inhaler, Inhale 2 puffs into the lungs 2 (two) times daily., Disp: , Rfl:    Buprenorphine HCl-Naloxone HCl 2-0.5 MG FILM, Place 1 Film under the tongue in the morning and at bedtime., Disp: , Rfl:    cephALEXin  (KEFLEX ) 500 MG capsule, Take 1 capsule (500 mg total) by mouth 3 (three) times daily., Disp: 30 capsule, Rfl: 0   chlorthalidone (HYGROTON) 25 MG tablet, Take 25 mg by mouth in the morning., Disp: ,  Rfl:    Cholecalciferol (VITAMIN D3 PO), Take 1 tablet by mouth every 3 (three) days., Disp: , Rfl:    citalopram (CELEXA) 40 MG tablet, Take 40 mg by mouth in the morning., Disp: , Rfl:    fluticasone (FLONASE) 50 MCG/ACT nasal spray, Place 2 sprays into both nostrils 2 (two) times daily., Disp: , Rfl:    fluticasone furoate-vilanterol (BREO ELLIPTA) 100-25 MCG/ACT AEPB, Inhale 1 puff into the lungs daily., Disp: , Rfl:    mometasone (ELOCON) 0.1 % ointment, Apply topically 2 (two) times daily as needed., Disp: , Rfl:    naloxone (NARCAN) nasal spray 4 mg/0.1 mL, Place 1 spray in alternating nostrils as directed, Disp: , Rfl:    pantoprazole (PROTONIX) 40 MG tablet,  Take 40 mg by mouth in the morning., Disp: , Rfl:    Potassium Citrate  15 MEQ (1620 MG) TBCR, Take 2 tablets by mouth in the morning and at bedtime., Disp: 360 tablet, Rfl: 3   pregabalin (LYRICA) 50 MG capsule, Take 50 mg by mouth 3 (three) times daily., Disp: , Rfl:    sildenafil  (VIAGRA ) 50 MG tablet, Take 1 tablet (50 mg total) by mouth daily as needed for erectile dysfunction., Disp: 30 tablet, Rfl: 6   tadalafil  (CIALIS ) 10 MG tablet, TAKE 1 TABLET BY MOUTH DAILY AS NEEDED FOR ERECTILE DYSFUNCTION, Disp: 90 tablet, Rfl: 5  Observations/Objective: Patient is well-developed, well-nourished in no acute distress.  Resting comfortably  at home.  Head is normocephalic, atraumatic.  No labored breathing.  Speech is clear and coherent with logical content.  Patient is alert and oriented at baseline.    Assessment and Plan: 1. Recurrent UTI (Primary) - ciprofloxacin  (CIPRO ) 500 MG tablet; Take 1 tablet (500 mg total) by mouth 2 (two) times daily for 7 days.  Dispense: 14 tablet; Refill: 0  2. Hospital discharge follow-up  ED notes reviewed Start cipro   UA reviewed Force fluids Keep Urologists follow up this week   Follow Up Instructions: I discussed the assessment and treatment plan with the patient. The patient was provided an opportunity to ask questions and all were answered. The patient agreed with the plan and demonstrated an understanding of the instructions.  A copy of instructions were sent to the patient via MyChart unless otherwise noted below.     The patient was advised to call back or seek an in-person evaluation if the symptoms worsen or if the condition fails to improve as anticipated.    Bari Learn, FNP

## 2023-11-05 ENCOUNTER — Other Ambulatory Visit: Payer: Self-pay

## 2023-11-05 DIAGNOSIS — R399 Unspecified symptoms and signs involving the genitourinary system: Secondary | ICD-10-CM

## 2023-11-06 ENCOUNTER — Ambulatory Visit (INDEPENDENT_AMBULATORY_CARE_PROVIDER_SITE_OTHER): Admitting: Urology

## 2023-11-06 ENCOUNTER — Other Ambulatory Visit
Admission: RE | Admit: 2023-11-06 | Discharge: 2023-11-06 | Disposition: A | Discharge status: Home / Self Care | Attending: Urology | Admitting: Urology

## 2023-11-06 VITALS — BP 107/72 | HR 125 | Wt 327.0 lb

## 2023-11-06 DIAGNOSIS — R399 Unspecified symptoms and signs involving the genitourinary system: Secondary | ICD-10-CM

## 2023-11-06 DIAGNOSIS — N390 Urinary tract infection, site not specified: Secondary | ICD-10-CM

## 2023-11-06 DIAGNOSIS — C61 Malignant neoplasm of prostate: Secondary | ICD-10-CM | POA: Diagnosis not present

## 2023-11-06 LAB — URINALYSIS, COMPLETE (UACMP) WITH MICROSCOPIC
Bilirubin Urine: NEGATIVE
Glucose, UA: NEGATIVE mg/dL
Hgb urine dipstick: NEGATIVE
Ketones, ur: NEGATIVE mg/dL
Nitrite: NEGATIVE
Protein, ur: NEGATIVE mg/dL
Specific Gravity, Urine: 1.025 (ref 1.005–1.030)
WBC, UA: >50 WBC/hpf (ref 0–5)
pH: 8.5 — ABNORMAL HIGH (ref 5.0–8.0)

## 2023-11-06 LAB — BLADDER SCAN AMB NON-IMAGING

## 2023-11-06 MED ORDER — CIPROFLOXACIN HCL 500 MG PO TABS: 500.0000 mg | ORAL_TABLET | Freq: Two times a day (BID) | ORAL | 0 refills | Status: DC

## 2023-11-06 NOTE — Progress Notes (Signed)
   11/06/2023 2:56 PM   Richard Duncan 11-01-1950 [id number]  Reason for visit: Dysuria, UTI symptoms, follow up BPH, low risk prostate cancer, nephrolithiasis, ED  History: Numerous comorbidities, morbid obesity with BMI 44, diabetes Very low risk prostate cancer diagnosed 2010, PSA has remained very low Underwent HOLEP a very large median lobe only 2020 with pathology showing 31 g benign tissue Uric acid stones, has done very well on potassium citrate  alkalinization Cialis /sildenafil  for ED  Physical Exam: BP 107/72 (BP Location: Right Arm, Patient Position: Sitting, Cuff Size: Large)   Pulse (!) 125   Wt (!) 327 lb (148.3 kg)   SpO2 94%   BMI 43.14 kg/m    Imaging/labs: Labs from recent ER visit reviewed including E. coli culture UTI, urinalysis today with persistent pyuria I personally viewed and interpreted the recent CT with no hydronephrosis or stones, decompressed bladder  Today: Seen in ER 10/20/2023 with UTI symptoms and urine culture grew E. coli, CT was benign.  Treated with 5-day course of Keflex  which improved his symptoms temporarily, recurrence and was then started on a 7-day course of Cipro .  He still has some mild dysuria. PVR today normal 40ml  Plan:   UTI: I recommended extending his course of Cipro  to 3 weeks for likely prostatitis, return precautions discussed Uric acid stones: Continue potassium citrate  Low risk prostate cancer: Continue yearly PSA monitoring, stable since 2010 BPH: Emptying appropriately after HOLEP of median lobe ED: Continue PDE5 inhibitors Keep follow-up as scheduled March 2026, sooner if problems   Richard JAYSON Burnet, MD  [facility] [address], [address] [place], [place] [address] [phone]

## 2023-11-07 DIAGNOSIS — R399 Unspecified symptoms and signs involving the genitourinary system: Secondary | ICD-10-CM

## 2023-11-07 DIAGNOSIS — N390 Urinary tract infection, site not specified: Secondary | ICD-10-CM

## 2023-11-07 LAB — URINE CULTURE: Culture: NO GROWTH

## 2023-11-11 MED ORDER — SULFAMETHOXAZOLE-TRIMETHOPRIM 800-160 MG PO TABS: 1.0000 | ORAL_TABLET | Freq: Two times a day (BID) | ORAL | 0 refills | Status: AC

## 2023-11-12 DIAGNOSIS — N1831 Chronic kidney disease, stage 3a: Secondary | ICD-10-CM | POA: Diagnosis not present

## 2023-11-12 DIAGNOSIS — R269 Unspecified abnormalities of gait and mobility: Secondary | ICD-10-CM | POA: Diagnosis not present

## 2023-11-12 DIAGNOSIS — Z79891 Long term (current) use of opiate analgesic: Secondary | ICD-10-CM | POA: Diagnosis not present

## 2023-11-12 DIAGNOSIS — M810 Age-related osteoporosis without current pathological fracture: Secondary | ICD-10-CM | POA: Diagnosis not present

## 2023-11-12 DIAGNOSIS — Z008 Encounter for other general examination: Secondary | ICD-10-CM | POA: Diagnosis not present

## 2023-11-12 DIAGNOSIS — R7303 Prediabetes: Secondary | ICD-10-CM | POA: Diagnosis not present

## 2023-11-13 MED ORDER — CEPHALEXIN 500 MG PO CAPS: 500.0000 mg | ORAL_CAPSULE | Freq: Two times a day (BID) | ORAL | 0 refills | Status: AC

## 2024-03-30 ENCOUNTER — Ambulatory Visit: Admitting: Urology

## 2024-03-31 ENCOUNTER — Ambulatory Visit: Admitting: Urology
